# Patient Record
Sex: Female | Born: 1990
Health system: Southern US, Community
[De-identification: ages and names within clinical notes are randomized; demographics above are authoritative.]

## PROBLEM LIST (undated history)

## (undated) ENCOUNTER — Inpatient Hospital Stay (HOSPITAL_COMMUNITY): Payer: Medicaid Other

## (undated) DIAGNOSIS — R011 Cardiac murmur, unspecified: Secondary | ICD-10-CM

## (undated) DIAGNOSIS — D219 Benign neoplasm of connective and other soft tissue, unspecified: Secondary | ICD-10-CM

## (undated) DIAGNOSIS — H4710 Unspecified papilledema: Secondary | ICD-10-CM

## (undated) DIAGNOSIS — R42 Dizziness and giddiness: Secondary | ICD-10-CM

## (undated) DIAGNOSIS — D649 Anemia, unspecified: Secondary | ICD-10-CM

## (undated) DIAGNOSIS — F419 Anxiety disorder, unspecified: Secondary | ICD-10-CM

## (undated) DIAGNOSIS — Z8679 Personal history of other diseases of the circulatory system: Secondary | ICD-10-CM

## (undated) HISTORY — DX: Anemia, unspecified: D64.9

## (undated) HISTORY — DX: Benign neoplasm of connective and other soft tissue, unspecified: D21.9

## (undated) HISTORY — DX: Dizziness and giddiness: R42

## (undated) HISTORY — PX: APPENDECTOMY: SHX54

## (undated) HISTORY — DX: Unspecified papilledema: H47.10

---
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
## 2017-04-13 HISTORY — PX: APPENDECTOMY: SHX54

## 2018-05-18 DIAGNOSIS — D251 Intramural leiomyoma of uterus: Secondary | ICD-10-CM | POA: Insufficient documentation

## 2018-05-18 DIAGNOSIS — Z8614 Personal history of Methicillin resistant Staphylococcus aureus infection: Secondary | ICD-10-CM | POA: Insufficient documentation

## 2018-05-18 DIAGNOSIS — J45909 Unspecified asthma, uncomplicated: Secondary | ICD-10-CM | POA: Insufficient documentation

## 2018-06-04 DIAGNOSIS — D219 Benign neoplasm of connective and other soft tissue, unspecified: Secondary | ICD-10-CM | POA: Insufficient documentation

## 2018-06-15 DIAGNOSIS — D259 Leiomyoma of uterus, unspecified: Secondary | ICD-10-CM | POA: Diagnosis not present

## 2019-11-09 ENCOUNTER — Encounter: Payer: Self-pay | Admitting: Nurse Practitioner

## 2019-11-09 ENCOUNTER — Ambulatory Visit (INDEPENDENT_AMBULATORY_CARE_PROVIDER_SITE_OTHER): Payer: BC Managed Care – PPO | Admitting: Nurse Practitioner

## 2019-11-09 ENCOUNTER — Other Ambulatory Visit: Payer: Self-pay

## 2019-11-09 VITALS — BP 120/72 | HR 88 | Temp 97.8°F | Ht 65.2 in | Wt 205.2 lb

## 2019-11-09 DIAGNOSIS — R002 Palpitations: Secondary | ICD-10-CM | POA: Diagnosis not present

## 2019-11-09 DIAGNOSIS — E559 Vitamin D deficiency, unspecified: Secondary | ICD-10-CM

## 2019-11-09 DIAGNOSIS — R42 Dizziness and giddiness: Secondary | ICD-10-CM | POA: Diagnosis not present

## 2019-11-09 DIAGNOSIS — Z1159 Encounter for screening for other viral diseases: Secondary | ICD-10-CM | POA: Diagnosis not present

## 2019-11-09 DIAGNOSIS — Z114 Encounter for screening for human immunodeficiency virus [HIV]: Secondary | ICD-10-CM

## 2019-11-09 DIAGNOSIS — Z7689 Persons encountering health services in other specified circumstances: Secondary | ICD-10-CM | POA: Diagnosis not present

## 2019-11-09 NOTE — Progress Notes (Signed)
This visit occurred during the SARS-CoV-2 public health emergency.  Safety protocols were in place, including screening questions prior to the visit, additional usage of staff PPE, and extensive cleaning of exam room while observing appropriate contact time as indicated for disinfecting solutions.  Subjective:     Patient ID: Carolyn Cole , female    DOB: [birth year] , 29 y.o.   MRN: [id number]   Chief Complaint  Patient presents with  . Establish Care  . Dizziness    HPI  Here to establish care, she went on google and follow up.  She had been seeing Dr. Kyung Rudd at [facility]. She is originally from [place], has been in [place] for 2 years.  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  Single.  No children.    PMH -appendectomy, fibroids, had a heart murmur as a child.   The last time she was at Dr. Jeanie Sewer she was given iron and vitamin d was low and medications from vertigo. She is no longer taking vitamin d.    She reports she was feeling faint and dizzy yesterday.  She drinks about 2 glasses of water a day. LMP 7/20 - regular, no current birth control. She also reports having heart palpitations after getting in bed while in [place].  Does not drink caffeine but does eat chocolate. Her symptoms occur mostly at work. She does report having a concussion while in undergraduate school.  She will have problems when scrolling through phone and tv channels.  She does feel like the room is spinning.  The previous provider was going to send her to an ENT but did not have any insurance. She did take the medication for dizziness 2 times but was not effective.   [facility] - mother - thyroid problems, hypertension. Father - hypertension, bleeding ulcers. Sister (middle sister) Chiari malformation, Older sister - breast lumps.   She does report she has had a mammogram to evaluate lumps as a child.      Past Medical  History:  Diagnosis Date  . Anemia   . Fibroids   . Vertigo      Family History  Problem Relation Age of Onset  . Thyroid disease Mother   . Hypertension Mother   . Hypertension Father   . Ulcers Father   . Gout Paternal Grandmother      Current Outpatient Medications:  Marland Kitchen  MECLIZINE HCL PO, Take by mouth as needed., Disp: , Rfl:  .  Vitamin D, Ergocalciferol, (DRISDOL) 1.25 MG (50000 UNIT) CAPS capsule, Take 1 capsule (50,000 Units total) by mouth every 7 (seven) days., Disp: 12 capsule, Rfl: 0   No Known Allergies   Review of Systems  Constitutional: Negative.   Respiratory: Negative.   Cardiovascular: Negative.   Gastrointestinal: Negative.   Neurological: Positive for dizziness.  Psychiatric/Behavioral: Negative.      Today's Vitals   11/09/19 1610 11/09/19 1616 11/09/19 1618 11/09/19 1620  BP: 118/82 120/82 118/70 120/72  Pulse: 96 65 71 88  Temp: 97.8 F (36.6 C)     TempSrc: Oral     Weight: (!) 205 lb 3.2 oz (93.1 kg)     Height: 5' 5.2" (1.656 m)     PainSc: 0-No pain      Body mass index is 33.94 kg/m.   Objective:  Physical Exam Constitutional:      Appearance: Normal appearance. She is obese.  Eyes:  Extraocular Movements: Extraocular movements intact.     Right eye: No nystagmus.     Conjunctiva/sclera: Conjunctivae normal.     Pupils: Pupils are equal, round, and reactive to light.  Cardiovascular:     Rate and Rhythm: Normal rate and regular rhythm.     Pulses: Normal pulses.     Heart sounds: Normal heart sounds.  Pulmonary:     Effort: Pulmonary effort is normal. No respiratory distress.     Breath sounds: Normal breath sounds. No wheezing.  Skin:    General: Skin is warm and dry.  Neurological:     General: No focal deficit present.     Mental Status: She is alert and oriented to person, place, and time.     Cranial Nerves: Cranial nerves are intact. No cranial nerve deficit.  Psychiatric:        Mood and Affect: Mood normal.         Behavior: Behavior normal.        Thought Content: Thought content normal.        Judgment: Judgment normal.         Assessment And Plan:     1. Establishing care with new doctor, encounter for  This is her first visit here had been seeing Dr. Jeanie Sewer in [place] as recent as April 2021  2. Palpitations  EKG done, NSR  No abnormal findings on physical exam.    - CBC with Differential/Platelet - TSH  3. Vertigo  She is having worsening dizziness and does not recall being diagnosed with vertigo  Will check for metabolic causes  She is encouraged to take meclizine as needed - CBC with Differential/Platelet - TSH  4. Vitamin D deficiency - VITAMIN D 25 Hydroxy (Vit-D Deficiency, Fractures)  6. Encounter for hepatitis C screening test for low risk patient  Will check Hepatitis C screening due to recent recommendations to screen all adults 18 years and older - Hepatitis C antibody  6. Encounter for HIV (human immunodeficiency virus) test  - HIV Antibody (routine testing w rflx)   Patient was given opportunity to ask questions. Patient verbalized understanding of the plan and was able to repeat key elements of the plan. All questions were answered to their satisfaction.  Minette Brine, FNP   I, Minette Brine, FNP, have reviewed all documentation for this visit. The documentation on 12/05/19 for the exam, diagnosis, procedures, and orders are all accurate and complete.  THE PATIENT IS ENCOURAGED TO PRACTICE SOCIAL DISTANCING DUE TO THE COVID-19 PANDEMIC.

## 2019-11-09 NOTE — Patient Instructions (Signed)
COVID-19 Vaccine Information can be found at: [email or web address] For questions related to vaccine distribution or appointments, please email [email or web address] or call [phone].    

## 2019-11-10 LAB — CBC WITH DIFFERENTIAL/PLATELET
Basophils Absolute: 0 10*3/uL (ref 0.0–0.2)
Basos: 0 %
EOS (ABSOLUTE): 0.2 10*3/uL (ref 0.0–0.4)
Eos: 4 %
Hematocrit: 41.1 % (ref 34.0–46.6)
Hemoglobin: 13.6 g/dL (ref 11.1–15.9)
Immature Grans (Abs): 0 10*3/uL (ref 0.0–0.1)
Immature Granulocytes: 0 %
Lymphocytes Absolute: 1.4 10*3/uL (ref 0.7–3.1)
Lymphs: 27 %
MCH: 32.5 pg (ref 26.6–33.0)
MCHC: 33.1 g/dL (ref 31.5–35.7)
MCV: 98 fL — ABNORMAL HIGH (ref 79–97)
Monocytes Absolute: 0.5 10*3/uL (ref 0.1–0.9)
Monocytes: 10 %
Neutrophils Absolute: 3 10*3/uL (ref 1.4–7.0)
Neutrophils: 59 %
Platelets: 281 10*3/uL (ref 150–450)
RBC: 4.19 x10E6/uL (ref 3.77–5.28)
RDW: 13.1 % (ref 11.7–15.4)
WBC: 5.1 10*3/uL (ref 3.4–10.8)

## 2019-11-10 LAB — HIV ANTIBODY (ROUTINE TESTING W REFLEX): HIV Screen 4th Generation wRfx: NONREACTIVE

## 2019-11-10 LAB — TSH: TSH: 1.94 u[IU]/mL (ref 0.450–4.500)

## 2019-11-10 LAB — VITAMIN D 25 HYDROXY (VIT D DEFICIENCY, FRACTURES): Vit D, 25-Hydroxy: 27.4 ng/mL — ABNORMAL LOW (ref 30.0–100.0)

## 2019-11-10 LAB — HEPATITIS C ANTIBODY: Hep C Virus Ab: 0.1 s/co ratio (ref 0.0–0.9)

## 2019-11-13 ENCOUNTER — Other Ambulatory Visit: Payer: Self-pay | Admitting: Nurse Practitioner

## 2019-11-13 DIAGNOSIS — E559 Vitamin D deficiency, unspecified: Secondary | ICD-10-CM

## 2019-11-13 MED ORDER — VITAMIN D (ERGOCALCIFEROL) 1.25 MG (50000 UNIT) PO CAPS: 50000.0000 [IU] | ORAL_CAPSULE | ORAL | 0 refills | Status: DC

## 2019-11-15 ENCOUNTER — Other Ambulatory Visit: Payer: Self-pay

## 2019-11-15 ENCOUNTER — Telehealth: Payer: Self-pay

## 2019-11-15 MED ORDER — VITAMIN D (ERGOCALCIFEROL) 1.25 MG (50000 UNIT) PO CAPS: 50000.0000 [IU] | ORAL_CAPSULE | ORAL | 0 refills | Status: DC

## 2019-11-15 NOTE — Telephone Encounter (Signed)
PT RTND CALL FOR TEST RESULTS.Marland KitchenPLEASE CALL

## 2019-11-15 NOTE — Telephone Encounter (Signed)
Patient notified YL,RMA 

## 2019-11-22 DIAGNOSIS — N920 Excessive and frequent menstruation with regular cycle: Secondary | ICD-10-CM | POA: Diagnosis not present

## 2019-12-05 ENCOUNTER — Ambulatory Visit: Payer: BC Managed Care – PPO | Admitting: Nurse Practitioner

## 2019-12-05 ENCOUNTER — Encounter: Payer: Self-pay | Admitting: Nurse Practitioner

## 2019-12-05 ENCOUNTER — Other Ambulatory Visit: Payer: Self-pay

## 2019-12-05 VITALS — Temp 98.1°F | Ht 65.0 in | Wt 206.8 lb

## 2019-12-05 DIAGNOSIS — R519 Headache, unspecified: Secondary | ICD-10-CM

## 2019-12-05 DIAGNOSIS — R42 Dizziness and giddiness: Secondary | ICD-10-CM

## 2019-12-05 DIAGNOSIS — D219 Benign neoplasm of connective and other soft tissue, unspecified: Secondary | ICD-10-CM | POA: Diagnosis not present

## 2019-12-05 DIAGNOSIS — N920 Excessive and frequent menstruation with regular cycle: Secondary | ICD-10-CM | POA: Diagnosis not present

## 2019-12-05 NOTE — Progress Notes (Signed)
I,Yamilka Roman [employer] as a Education administrator for [employer], FNP.,have documented all relevant documentation on the behalf of Minette Brine, FNP,as directed by  Minette Brine, FNP while in the presence of Minette Brine, [employer]. This visit occurred during the SARS-CoV-2 public health emergency.  Safety protocols were in place, including screening questions prior to the visit, additional usage of staff PPE, and extensive cleaning of exam room while observing appropriate contact time as indicated for disinfecting solutions.  Subjective:     Patient ID: Carolyn Cole , female    DOB: November 23, 1990 , 29 y.o.   MRN: [id number]   Chief Complaint  Patient presents with  . Dizziness    patient stated she has been feeling dizzy and out of it. she wants to know if she can get a scan on her brain or something. she stated she was on meclinzine but that doesnt work for her     HPI  Continues to have dizziness.  She has been taking meclizine without dizziness. 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   She is requesting a CT scan.  Drinking a little under a 1/2 gallon of water a day. LMP 8/15 - normal.  She does have a history of a concussion in 2014. She does have a headache above her left eye.  She has been doing a tea.    She reports she feels "loopy", she was having a headache but does not want to take medications.  She will also have trouble with looking at the TV screen or phone.     Past Medical History:  Diagnosis Date  . Anemia   . Fibroids   . Vertigo      Family History  Problem Relation Age of Onset  . Thyroid disease Mother   . Hypertension Mother   . Hypertension Father   . Ulcers Father   . Gout Paternal Grandmother      Current Outpatient Medications:  Marland Kitchen  MECLIZINE HCL PO, Take by mouth as needed., Disp: , Rfl:  .  Vitamin D, Ergocalciferol, (DRISDOL) 1.25 MG (50000 UNIT) CAPS capsule, Take 1 capsule (50,000 Units  total) by mouth every 7 (seven) days., Disp: 12 capsule, Rfl: 0   No Known Allergies   Review of Systems  Constitutional: Negative.   Respiratory: Negative.  Negative for cough.   Cardiovascular: Negative.  Negative for chest pain, palpitations and leg swelling.  Neurological: Positive for dizziness. Negative for headaches.  Psychiatric/Behavioral: Negative.      Today's Vitals   12/05/19 1143  Temp: 98.1 F (36.7 C)  TempSrc: Oral  Weight: 206 lb 12.8 oz (93.8 kg)  Height: 5\' 5"  (1.651 m)  PainSc: 0-No pain   Body mass index is 34.41 kg/m.   Objective:  Physical Exam Constitutional:      General: She is not in acute distress.    Appearance: Normal appearance.  Cardiovascular:     Rate and Rhythm: Normal rate and regular rhythm.     Pulses: Normal pulses.     Heart sounds: Normal heart sounds. No murmur heard.   Pulmonary:     Effort: Pulmonary effort is normal. No respiratory distress.     Breath sounds: Normal breath sounds.  Neurological:     General: No focal deficit present.     Mental Status: She is alert and oriented to person, place, and time.  Psychiatric:  Mood and Affect: Mood normal.        Behavior: Behavior normal.        Thought Content: Thought content normal.        Judgment: Judgment normal.         Assessment And Plan:     1. Dizziness  Continues to have dizziness even when taking meclizine  Will send for CT scan brain without contrast  Take magnesium as needed  - CT Head Wo Contrast; Future  2. Acute nonintractable headache, unspecified headache type  I discussed with her that her dizziness could be related to an atypical migraine, she is encouraged to take tylenol 500 mg as needed for her headache and see if she has improvement  She can also take magnesium 250 mg daily with evening meal.    Patient was given opportunity to ask questions. Patient verbalized understanding of the plan and was able to repeat key elements of the  plan. All questions were answered to their satisfaction.  Minette Brine, FNP   I, Minette Brine, FNP, have reviewed all documentation for this visit. The documentation on 12/05/19 for the exam, diagnosis, procedures, and orders are all accurate and complete.  THE PATIENT IS ENCOURAGED TO PRACTICE SOCIAL DISTANCING DUE TO THE COVID-19 PANDEMIC.

## 2019-12-05 NOTE — Patient Instructions (Signed)
   Magnesium 250 mg with evening meal to help with headache  Be sure to stay well hydrated with water.   Pending scan results will refer to Neurology if needed.  Encouraged to take tylenol 500 mg as needed for headache

## 2019-12-06 ENCOUNTER — Ambulatory Visit: Payer: BC Managed Care – PPO | Admitting: Nurse Practitioner

## 2019-12-08 ENCOUNTER — Other Ambulatory Visit: Payer: BC Managed Care – PPO

## 2019-12-08 ENCOUNTER — Other Ambulatory Visit: Payer: Self-pay

## 2019-12-08 DIAGNOSIS — Z20822 Contact with and (suspected) exposure to covid-19: Secondary | ICD-10-CM

## 2019-12-10 LAB — SARS-COV-2, NAA 2 DAY TAT

## 2019-12-10 LAB — NOVEL CORONAVIRUS, NAA: SARS-CoV-2, NAA: DETECTED — AB

## 2019-12-13 ENCOUNTER — Other Ambulatory Visit: Payer: BC Managed Care – PPO

## 2019-12-15 ENCOUNTER — Other Ambulatory Visit: Payer: Self-pay | Admitting: Nurse Practitioner

## 2019-12-19 ENCOUNTER — Other Ambulatory Visit: Payer: Self-pay

## 2019-12-22 ENCOUNTER — Other Ambulatory Visit: Payer: Self-pay

## 2019-12-22 ENCOUNTER — Ambulatory Visit
Admission: RE | Admit: 2019-12-22 | Discharge: 2019-12-22 | Disposition: A | Discharge status: Home / Self Care | Payer: BC Managed Care – PPO | Source: Ambulatory Visit | Attending: Nurse Practitioner | Admitting: Nurse Practitioner

## 2019-12-22 DIAGNOSIS — R42 Dizziness and giddiness: Secondary | ICD-10-CM

## 2020-01-22 DIAGNOSIS — R519 Headache, unspecified: Secondary | ICD-10-CM | POA: Diagnosis not present

## 2020-01-22 DIAGNOSIS — H47333 Pseudopapilledema of optic disc, bilateral: Secondary | ICD-10-CM | POA: Diagnosis not present

## 2020-01-29 DIAGNOSIS — E669 Obesity, unspecified: Secondary | ICD-10-CM | POA: Diagnosis not present

## 2020-01-29 DIAGNOSIS — D219 Benign neoplasm of connective and other soft tissue, unspecified: Secondary | ICD-10-CM | POA: Diagnosis not present

## 2020-01-29 DIAGNOSIS — Z01411 Encounter for gynecological examination (general) (routine) with abnormal findings: Secondary | ICD-10-CM | POA: Diagnosis not present

## 2020-01-29 DIAGNOSIS — Z13220 Encounter for screening for lipoid disorders: Secondary | ICD-10-CM | POA: Diagnosis not present

## 2020-01-29 DIAGNOSIS — Z124 Encounter for screening for malignant neoplasm of cervix: Secondary | ICD-10-CM | POA: Diagnosis not present

## 2020-01-29 DIAGNOSIS — Z62819 Personal history of unspecified abuse in childhood: Secondary | ICD-10-CM | POA: Diagnosis not present

## 2020-04-08 ENCOUNTER — Encounter: Payer: Self-pay | Admitting: Diagnostic Neuroimaging

## 2020-04-08 ENCOUNTER — Ambulatory Visit: Payer: BC Managed Care – PPO | Admitting: Diagnostic Neuroimaging

## 2020-04-08 VITALS — BP 119/77 | HR 73 | Ht 64.0 in | Wt 215.0 lb

## 2020-04-08 DIAGNOSIS — H4710 Unspecified papilledema: Secondary | ICD-10-CM | POA: Diagnosis not present

## 2020-04-08 DIAGNOSIS — G4489 Other headache syndrome: Secondary | ICD-10-CM

## 2020-04-08 NOTE — Patient Instructions (Signed)
  HEADACHES / LIGHTHEADEDNESS - check MRI brain; then consider LP

## 2020-04-08 NOTE — Progress Notes (Signed)
[facility]  PATIENT: Carolyn Cole DOB: 1991-01-17  REFERRING CLINICIAN: Stephannie Li, OD HISTORY FROM: patient  REASON FOR VISIT: new consult    HISTORICAL  CHIEF COMPLAINT:  Chief Complaint  Patient presents with  . Bilateral Papilledema    Rm 7 New Pt     HISTORY OF PRESENT ILLNESS:   29 year old female here for evaluation of papilledema.  For past 1 year patient has had intermittent headaches on the front or side of her head.  Sometimes she has ringing in the ears.  No blurred vision, nausea, sensitive to light or sound.  She was also having some intermittent "dizziness" sensations which she describes as lightheadedness and faint sensation when she stands up or moves too quickly.  No spinning sensation or nausea.  She was also having some problems with visual attention and focusing, especially when shopping at the store or using her phone.  This is not a blurred vision problem but but instead a difficulty processing the information.  She also had some eye twitching and muscle twitching on the left side.    Patient has had weight fluctuation over the past 2 to 3 years ranging from 170 up to 215 pounds.  She is currently at her highest weight.  Patient went to optometrist in October 2021 for evaluation and was found to have bilateral papilledema on dilated retinal exam and OCT testing.  Overall symptoms have improved since Oct 2021.   REVIEW OF SYSTEMS: Full 14 system review of systems performed and negative with exception of: As per HPI.  ALLERGIES: No Known Allergies  HOME MEDICATIONS: Outpatient Medications Prior to Visit  Medication Sig Dispense Refill  . Vitamin D, Ergocalciferol, (DRISDOL) 1.25 MG (50000 UNIT) CAPS capsule TAKE 1 CAPSULE BY MOUTH EVERY 7 DAYS 12 capsule 0  . MECLIZINE HCL PO Take by mouth as needed. (Patient not taking: Reported on 04/08/2020)     No facility-administered medications prior to visit.    PAST MEDICAL  HISTORY: Past Medical History:  Diagnosis Date  . Anemia   . Fibroids   . Papilledema, both eyes   . Vertigo     PAST SURGICAL HISTORY: Past Surgical History:  Procedure Laterality Date  . APPENDECTOMY      FAMILY HISTORY: Family History  Problem Relation Age of Onset  . Thyroid disease Mother   . Hypertension Mother   . Hypertension Father   . Ulcers Father   . Gout Paternal Grandmother   . Chiari malformation Sister     SOCIAL HISTORY: Social History   Socioeconomic History  . Marital status: Single    Spouse name: Not on file  . Number of children: 0  . Years of education: Not on file  . Highest education level: Master's degree (e.g., MA, MS, MEng, MEd, MSW, MBA)  Occupational History  . Not on file  Tobacco Use  . Smoking status: Never Smoker  . Smokeless tobacco: Never Used  Substance and Sexual Activity  . Alcohol use: Yes    Comment: social  . Drug use: Never  . Sexual activity: Not on file  Other Topics Concern  . Not on file  Social History Narrative   Caffeine- green tea   Social Determinants of Health   Financial Resource Strain: Not on file  Food Insecurity: Not on file  Transportation Needs: Not on file  Physical Activity: Not on file  Stress: Not on file  Social Connections: Not on file  Intimate Partner Violence: Not on file  PHYSICAL EXAM  GENERAL EXAM/CONSTITUTIONAL: Vitals:  Vitals:   04/08/20 0945  BP: 119/77  Pulse: 73  Weight: 215 lb (97.5 kg)  Height: 5\' 4"  (1.626 m)     Body mass index is 36.9 kg/m. Wt Readings from Last 3 Encounters:  04/08/20 215 lb (97.5 kg)  12/05/19 206 lb 12.8 oz (93.8 kg)  11/09/19 (!) 205 lb 3.2 oz (93.1 kg)     Patient is in no distress; well developed, nourished and groomed; neck is supple  CARDIOVASCULAR:  Examination of carotid arteries is normal; no carotid bruits  Regular rate and rhythm, no murmurs  Examination of peripheral vascular system by observation and palpation  is normal  EYES:  Ophthalmoscopic exam of optic discs and posterior segments is normal; no papilledema or hemorrhages  No exam data present  MUSCULOSKELETAL:  Gait, strength, tone, movements noted in Neurologic exam below  NEUROLOGIC: MENTAL STATUS:  No flowsheet data found.  awake, alert, oriented to person, place and time  recent and remote memory intact  normal attention and concentration  language fluent, comprehension intact, naming intact  fund of knowledge appropriate  CRANIAL NERVE:   2nd - BLURRED OPTIC DISC MARGINS  2nd, 3rd, 4th, 6th - pupils equal and reactive to light, visual fields full to confrontation, extraocular muscles intact, no nystagmus  5th - facial sensation symmetric  7th - facial strength symmetric  8th - hearing intact  9th - palate elevates symmetrically, uvula midline  11th - shoulder shrug symmetric  12th - tongue protrusion midline  MOTOR:   normal bulk and tone, full strength in the BUE, BLE  SENSORY:   normal and symmetric to light touch, temperature, vibration  COORDINATION:   finger-nose-finger, fine finger movements normal  REFLEXES:   deep tendon reflexes present and symmetric  GAIT/STATION:   narrow based gait     DIAGNOSTIC DATA (LABS, IMAGING, TESTING) - I reviewed patient records, labs, notes, testing and imaging myself where available.  Lab Results  Component Value Date   WBC 5.1 11/09/2019   HGB 13.6 11/09/2019   HCT 41.1 11/09/2019   MCV 98 (H) 11/09/2019   PLT 281 11/09/2019   No results found for: NA, K, CL, CO2, GLUCOSE, BUN, CREATININE, CALCIUM, PROT, ALBUMIN, AST, ALT, ALKPHOS, BILITOT, GFRNONAA, GFRAA No results found for: CHOL, HDL, LDLCALC, LDLDIRECT, TRIG, CHOLHDL No results found for: 11/11/2019 No results found for: VITAMINB12 Lab Results  Component Value Date   TSH 1.940 11/09/2019     12/22/19 CT head [I reviewed images myself and agree with interpretation. -VRP]  - no acute  findings    ASSESSMENT AND PLAN  29 y.o. year old female here with 1 year of intermittent lightheadedness, dizziness, difficulty with visual attention, intermittent ringing in ears, weight gain, and found to have papilledema.  May represent idiopathic intracranial hypertension.  Dx:  1. Papilledema   2. Other headache syndrome     PLAN:  PAPILLEDEMA / DIFFICULTY FOCUSING / TWITCHING - check MRI brain; then consider LP  Orders Placed This Encounter  Procedures  . MR BRAIN W WO CONTRAST   Return PENDING TEST RESULTS, for pending if symptoms worsen or fail to improve.    27, MD 04/08/2020, 10:31 AM Certified in Neurology, Neurophysiology and Neuroimaging  [facility] [address] [place], [place] [phone]

## 2020-04-10 ENCOUNTER — Other Ambulatory Visit: Payer: Self-pay | Admitting: Nurse Practitioner

## 2020-04-13 NOTE — L&D Delivery Note (Addendum)
Delivery Note Arrived to room to evaluate for cervical change. Patient was found to be complete and 0 to +1 station. Fetal head was delivered. A nuchal x 1 was present and reduced. Shoulders and body followed without difficulty. Infant was placed on mother's chest, mouth and nose were bulb suctioned and let out a vigorous cry. Delayed cord clamping was performed for 60 seconds. Venous cord blood was collected. Placenta delivered spontaneously. Cervix, vagina, perineum and labia were inspected and a second degree perineal laceration was noted and repaired using 3-0 Vicryl. Bilateral labia majora superficial, hemostatic lacerations were noted. Uterus fundus firm. Due to lower uterine segment atony, bimanual massage performed and clots extracted. TXA 1g IV and Cytotec 1024mcg per rectum was administered. Placenta was inspected, found to be intact with a 3 vessel cord. Counts were correct.   At 1:29 PM a viable and healthy female was delivered via Vaginal, Spontaneous (Presentation: Left Occiput Anterior).  APGAR: 9, 9; weight: see infant's chart   Placenta status: Spontaneous;Expressed;Pathology, Intact.  Cord: 3 vessels with the following complications: None.  Cord pH: Not collected  Anesthesia: Epidural Episiotomy: None Lacerations: 2nd degree Suture Repair: 3.0 vicryl Est. Blood Loss (mL): 817  Mom to postpartum.  Baby to Couplet care / Skin to Skin.  Carolyn Cole 01/29/2021, 1:52 PM

## 2020-04-17 ENCOUNTER — Telehealth: Payer: Self-pay | Admitting: Diagnostic Neuroimaging

## 2020-04-17 NOTE — Telephone Encounter (Signed)
LVM for pt to call back to schedule  [employer] auth through [facility]: [id number] (exp. 04/16/20 to 05/16/20)

## 2020-06-04 ENCOUNTER — Ambulatory Visit (INDEPENDENT_AMBULATORY_CARE_PROVIDER_SITE_OTHER): Payer: BC Managed Care – PPO | Admitting: *Deleted

## 2020-06-04 ENCOUNTER — Other Ambulatory Visit: Payer: Self-pay

## 2020-06-04 VITALS — Ht 64.0 in

## 2020-06-04 DIAGNOSIS — Z3201 Encounter for pregnancy test, result positive: Secondary | ICD-10-CM | POA: Diagnosis not present

## 2020-06-04 LAB — POCT URINE PREGNANCY: Preg Test, Ur: POSITIVE — AB

## 2020-06-04 NOTE — Progress Notes (Signed)
   Carolyn Cole presents today for UPT. She has no unusual complaints. LMP: 04/21/2020    OBJECTIVE: Appears well, in no apparent distress.  OB History    Gravida  1   Para      Term      Preterm      AB      Living        SAB      IAB      Ectopic      Multiple      Live Births             Home UPT Result: Positive In-Office UPT result: Positive I have reviewed the patient's medical, obstetrical, social, and family histories, and medications.   ASSESSMENT: Positive pregnancy test  PLAN Prenatal care to be completed at: undecided  Derl Barrow, RN

## 2020-06-11 DIAGNOSIS — O26899 Other specified pregnancy related conditions, unspecified trimester: Secondary | ICD-10-CM | POA: Diagnosis not present

## 2020-06-11 DIAGNOSIS — Z3201 Encounter for pregnancy test, result positive: Secondary | ICD-10-CM | POA: Diagnosis not present

## 2020-06-11 DIAGNOSIS — Z3400 Encounter for supervision of normal first pregnancy, unspecified trimester: Secondary | ICD-10-CM | POA: Diagnosis not present

## 2020-06-11 DIAGNOSIS — Z3401 Encounter for supervision of normal first pregnancy, first trimester: Secondary | ICD-10-CM | POA: Diagnosis not present

## 2020-06-11 LAB — OB RESULTS CONSOLE HIV ANTIBODY (ROUTINE TESTING): HIV: NONREACTIVE

## 2020-06-11 LAB — OB RESULTS CONSOLE RUBELLA ANTIBODY, IGM: Rubella: IMMUNE

## 2020-06-11 LAB — OB RESULTS CONSOLE HEPATITIS B SURFACE ANTIGEN: Hepatitis B Surface Ag: NEGATIVE

## 2020-06-11 LAB — OB RESULTS CONSOLE ANTIBODY SCREEN: Antibody Screen: NEGATIVE

## 2020-06-11 LAB — OB RESULTS CONSOLE ABO/RH: RH Type: POSITIVE

## 2020-07-03 DIAGNOSIS — Z3401 Encounter for supervision of normal first pregnancy, first trimester: Secondary | ICD-10-CM | POA: Diagnosis not present

## 2020-07-10 DIAGNOSIS — F4323 Adjustment disorder with mixed anxiety and depressed mood: Secondary | ICD-10-CM | POA: Diagnosis not present

## 2020-07-24 DIAGNOSIS — F4323 Adjustment disorder with mixed anxiety and depressed mood: Secondary | ICD-10-CM | POA: Diagnosis not present

## 2020-08-11 DIAGNOSIS — F4323 Adjustment disorder with mixed anxiety and depressed mood: Secondary | ICD-10-CM | POA: Diagnosis not present

## 2020-08-13 ENCOUNTER — Other Ambulatory Visit: Payer: Self-pay | Admitting: Nurse Practitioner

## 2020-08-16 DIAGNOSIS — Z20822 Contact with and (suspected) exposure to covid-19: Secondary | ICD-10-CM | POA: Diagnosis not present

## 2020-08-21 DIAGNOSIS — Z3482 Encounter for supervision of other normal pregnancy, second trimester: Secondary | ICD-10-CM | POA: Diagnosis not present

## 2020-08-21 DIAGNOSIS — Z3402 Encounter for supervision of normal first pregnancy, second trimester: Secondary | ICD-10-CM | POA: Diagnosis not present

## 2020-08-25 DIAGNOSIS — F4323 Adjustment disorder with mixed anxiety and depressed mood: Secondary | ICD-10-CM | POA: Diagnosis not present

## 2020-09-03 ENCOUNTER — Encounter (HOSPITAL_COMMUNITY): Payer: Self-pay

## 2020-09-03 ENCOUNTER — Other Ambulatory Visit: Payer: Self-pay

## 2020-09-03 ENCOUNTER — Emergency Department (HOSPITAL_COMMUNITY)
Admission: EM | Admit: 2020-09-03 | Discharge: 2020-09-03 | Disposition: A | Discharge status: Home / Self Care | Payer: BC Managed Care – PPO | Attending: Emergency Medicine | Admitting: Emergency Medicine

## 2020-09-03 DIAGNOSIS — R1031 Right lower quadrant pain: Secondary | ICD-10-CM | POA: Insufficient documentation

## 2020-09-03 DIAGNOSIS — Z3A19 19 weeks gestation of pregnancy: Secondary | ICD-10-CM | POA: Insufficient documentation

## 2020-09-03 DIAGNOSIS — O26892 Other specified pregnancy related conditions, second trimester: Secondary | ICD-10-CM | POA: Insufficient documentation

## 2020-09-03 DIAGNOSIS — D219 Benign neoplasm of connective and other soft tissue, unspecified: Secondary | ICD-10-CM

## 2020-09-03 DIAGNOSIS — O3412 Maternal care for benign tumor of corpus uteri, second trimester: Secondary | ICD-10-CM | POA: Diagnosis not present

## 2020-09-03 MED ORDER — IBUPROFEN 800 MG PO TABS
800.0000 mg | ORAL_TABLET | Freq: Once | ORAL | Status: AC
  Administered 2020-09-03: 800 mg via ORAL
  Filled 2020-09-03: qty 1

## 2020-09-03 NOTE — ED Triage Notes (Signed)
Patient reports that she is [redacted] weeks pregnant. Patient c/o right abdominal pain since yesterday. Patient states she was driving from [place] yesterday and also had low back pain, but today the pain continues. Patient reports that she has a uterine fibroid.

## 2020-09-03 NOTE — ED Provider Notes (Signed)
Emergency Medicine Provider Triage Evaluation Note  Carolyn Cole , a 30 y.o. female  was evaluated in triage.  Pt complains of right lower quadrant abdominal pain.  Pain began yesterday but worsened today.  She is currently [redacted] weeks pregnant.  Denies any vaginal bleeding or abnormal discharge.  No fever.  Review of Systems  Positive: Abdominal pain Negative: Fever  Physical Exam  BP (!) 142/82 (BP Location: Right Arm)   Pulse 84   Temp 98.8 F (37.1 C) (Oral)   Resp 16   Ht 5\' 4"  (1.626 m)   Wt 98.4 kg   LMP 04/21/2020 (Within Days)   SpO2 100%   BMI 37.25 kg/m  Gen:   Awake, no distress   Resp:  Normal effort  MSK:   Moves extremities without difficulty  Other:  Right lower quadrant tenderness  Medical Decision Making  Medically screening exam initiated at 4:01 PM.  Appropriate orders placed.  Carolyn Cole was informed that the remainder of the evaluation will be completed by another provider, this initial triage assessment does not replace that evaluation, and the importance of remaining in the ED until their evaluation is complete.  Lab work and will likely need imaging.   Delia Heady, PA-C 09/03/20 1602    Lacretia Leigh, MD 09/03/20 [phone]

## 2020-09-03 NOTE — ED Notes (Signed)
Reviewed d/c instructions with patient and significant other. Pt and visitor deny any further questions. Provided warm packs for transport home. No further care needed at this time

## 2020-09-03 NOTE — Discharge Instructions (Addendum)
Use a moist warm towel as we talked about to the area of your abdomen that hurts.  Go to [facility] immediately if you develop any uterine cramping, vaginal bleeding, or any other problems

## 2020-09-03 NOTE — ED Provider Notes (Addendum)
[place] DEPT Provider Note   CSN: [id number] Arrival date & time: 09/03/20  1529     History Chief Complaint  Patient presents with  . [redacted] weeks pregnant    Carolyn Cole is a 30 y.o. female.  30 year old female currently [redacted] weeks pregnant who presents with 24 hours of right lower quadrant pain.  She has a history of an appendectomy.  Also has a history of uterine fibroids and states that she is tender at the area where she has a known fibroid.  Denies any vaginal bleeding.  Denies any urinary symptoms.  No fever or chills.  Called her OB and used Tylenol without relief.        Past Medical History:  Diagnosis Date  . Anemia   . Fibroids   . Papilledema, both eyes   . Vertigo     There are no problems to display for this patient.   Past Surgical History:  Procedure Laterality Date  . APPENDECTOMY       OB History    Gravida  1   Para      Term      Preterm      AB      Living        SAB      IAB      Ectopic      Multiple      Live Births              Family History  Problem Relation Age of Onset  . Thyroid disease Mother   . Hypertension Mother   . Hypertension Father   . Ulcers Father   . Gout Paternal Grandmother   . Chiari malformation Sister     Social History   Tobacco Use  . Smoking status: Never Smoker  . Smokeless tobacco: Never Used  Vaping Use  . Vaping Use: Never used  Substance Use Topics  . Alcohol use: Not Currently  . Drug use: Never    Home Medications Prior to Admission medications   Medication Sig Start Date End Date Taking? Authorizing Provider  MECLIZINE HCL PO Take by mouth as needed. Patient not taking: Reported on 04/08/2020    [provider]  Vitamin D, Ergocalciferol, (DRISDOL) 1.25 MG (50000 UNIT) CAPS capsule TAKE 1 CAPSULE BY MOUTH EVERY 7 DAYS 08/13/20   Minette Brine, FNP    Allergies    Patient has no known allergies.  Review of Systems   Review  of Systems  All other systems reviewed and are negative.   Physical Exam Updated Vital Signs BP (!) 142/82 (BP Location: Right Arm)   Pulse 84   Temp 98.8 F (37.1 C) (Oral)   Resp 16   Ht 1.626 m (5\' 4" )   Wt 98.4 kg   LMP 04/21/2020 (Within Days)   SpO2 100%   BMI 37.25 kg/m   Physical Exam Vitals and nursing note reviewed.  Constitutional:      General: She is not in acute distress.    Appearance: Normal appearance. She is well-developed. She is not toxic-appearing.  HENT:     Head: Normocephalic and atraumatic.  Eyes:     General: Lids are normal.     Conjunctiva/sclera: Conjunctivae normal.     Pupils: Pupils are equal, round, and reactive to light.  Neck:     Thyroid: No thyroid mass.     Trachea: No tracheal deviation.  Cardiovascular:     Rate and Rhythm:  Normal rate and regular rhythm.     Heart sounds: Normal heart sounds. No murmur heard. No gallop.   Pulmonary:     Effort: Pulmonary effort is normal. No respiratory distress.     Breath sounds: Normal breath sounds. No stridor. No decreased breath sounds, wheezing, rhonchi or rales.  Abdominal:     General: Bowel sounds are normal. There is no distension.     Palpations: Abdomen is soft.     Tenderness: There is abdominal tenderness in the right lower quadrant. There is no rebound.       Comments: Palpable mass noted  Musculoskeletal:        General: No tenderness. Normal range of motion.     Cervical back: Normal range of motion and neck supple.  Skin:    General: Skin is warm and dry.     Findings: No abrasion or rash.  Neurological:     Mental Status: She is alert and oriented to person, place, and time.     GCS: GCS eye subscore is 4. GCS verbal subscore is 5. GCS motor subscore is 6.     Cranial Nerves: No cranial nerve deficit.     Sensory: No sensory deficit.  Psychiatric:        Speech: Speech normal.        Behavior: Behavior normal.     ED Results / Procedures / Treatments    Labs (all labs ordered are listed, but only abnormal results are displayed) Labs Reviewed  BASIC METABOLIC PANEL  CBC WITH DIFFERENTIAL/PLATELET  HCG, QUANTITATIVE, PREGNANCY    EKG None  Radiology No results found.  Procedures Procedures   Medications Ordered in ED Medications - No data to display  ED Course  I have reviewed the triage vital signs and the nursing notes.  Pertinent labs & imaging results that were available during my care of the patient were reviewed by me and considered in my medical decision making (see chart for details).    MDM Rules/Calculators/A&P                          Patient's case discussed with her obstetrician, Dr. Delora Fuel, who recommends patient get 1 dose of ibuprofen here.  She does not recommend patient have an ultrasound at this point.  Will check fetal heart tones.  Plan is for her to follow-up with Dr. Delora Fuel or go to [facility] if she should have any further needs  Fetal heart tones are in the 140s Final Clinical Impression(s) / ED Diagnoses Final diagnoses:  None    Rx / DC Orders ED Discharge Orders    None       Lacretia Leigh, MD 09/03/20 1659    Lacretia Leigh, MD 09/03/20 1709

## 2020-09-04 ENCOUNTER — Telehealth: Payer: Self-pay | Admitting: *Deleted

## 2020-09-04 NOTE — Telephone Encounter (Signed)
Transition Care Management Follow-up Telephone Call  Date of discharge and from where: 09/03/2020 - [facility]  How have you been since you were released from the hospital? "Still in some pain"  Any questions or concerns? No  Items Reviewed:  Did the pt receive and understand the discharge instructions provided? Yes   Medications obtained and verified? Yes   Other? No   Any new allergies since your discharge? No   Dietary orders reviewed? No  Do you have support at home? Yes    Functional Questionnaire: (I = Independent and D = Dependent) ADLs: I  Bathing/Dressing- I  Meal Prep- I  Eating- I  Maintaining continence- I  Transferring/Ambulation- I  Managing Meds- I  Follow up appointments reviewed:   PCP Hospital f/u appt confirmed? No  - has routine appointment in August  Specialist Hospital f/u appt confirmed? No    Are transportation arrangements needed? No   If their condition worsens, is the pt aware to call PCP or go to the Emergency Dept.? Yes  Was the patient provided with contact information for the PCP's office or ED? Yes  Was to pt encouraged to call back with questions or concerns? Yes

## 2020-09-05 DIAGNOSIS — F4323 Adjustment disorder with mixed anxiety and depressed mood: Secondary | ICD-10-CM | POA: Diagnosis not present

## 2020-09-13 DIAGNOSIS — Z36 Encounter for antenatal screening for chromosomal anomalies: Secondary | ICD-10-CM | POA: Diagnosis not present

## 2020-09-13 DIAGNOSIS — Z3402 Encounter for supervision of normal first pregnancy, second trimester: Secondary | ICD-10-CM | POA: Diagnosis not present

## 2020-09-26 DIAGNOSIS — F4323 Adjustment disorder with mixed anxiety and depressed mood: Secondary | ICD-10-CM | POA: Diagnosis not present

## 2020-10-15 DIAGNOSIS — Z362 Encounter for other antenatal screening follow-up: Secondary | ICD-10-CM | POA: Diagnosis not present

## 2020-10-15 DIAGNOSIS — Z3402 Encounter for supervision of normal first pregnancy, second trimester: Secondary | ICD-10-CM | POA: Diagnosis not present

## 2020-10-15 DIAGNOSIS — Z3490 Encounter for supervision of normal pregnancy, unspecified, unspecified trimester: Secondary | ICD-10-CM | POA: Diagnosis not present

## 2020-10-15 LAB — OB RESULTS CONSOLE RPR: RPR: NONREACTIVE

## 2020-10-16 DIAGNOSIS — F4323 Adjustment disorder with mixed anxiety and depressed mood: Secondary | ICD-10-CM | POA: Diagnosis not present

## 2020-10-22 DIAGNOSIS — O368190 Decreased fetal movements, unspecified trimester, not applicable or unspecified: Secondary | ICD-10-CM | POA: Diagnosis not present

## 2020-10-23 DIAGNOSIS — F4323 Adjustment disorder with mixed anxiety and depressed mood: Secondary | ICD-10-CM | POA: Diagnosis not present

## 2020-11-12 ENCOUNTER — Encounter: Payer: Self-pay | Admitting: Nurse Practitioner

## 2020-11-15 DIAGNOSIS — F4323 Adjustment disorder with mixed anxiety and depressed mood: Secondary | ICD-10-CM | POA: Diagnosis not present

## 2020-11-28 DIAGNOSIS — Z23 Encounter for immunization: Secondary | ICD-10-CM | POA: Diagnosis not present

## 2020-11-28 DIAGNOSIS — K30 Functional dyspepsia: Secondary | ICD-10-CM | POA: Diagnosis not present

## 2020-11-28 DIAGNOSIS — Z3403 Encounter for supervision of normal first pregnancy, third trimester: Secondary | ICD-10-CM | POA: Diagnosis not present

## 2020-12-10 DIAGNOSIS — F4323 Adjustment disorder with mixed anxiety and depressed mood: Secondary | ICD-10-CM | POA: Diagnosis not present

## 2020-12-23 DIAGNOSIS — O99213 Obesity complicating pregnancy, third trimester: Secondary | ICD-10-CM | POA: Diagnosis not present

## 2020-12-23 DIAGNOSIS — O3413 Maternal care for benign tumor of corpus uteri, third trimester: Secondary | ICD-10-CM | POA: Diagnosis not present

## 2020-12-23 DIAGNOSIS — Z3490 Encounter for supervision of normal pregnancy, unspecified, unspecified trimester: Secondary | ICD-10-CM | POA: Diagnosis not present

## 2020-12-23 DIAGNOSIS — Z3403 Encounter for supervision of normal first pregnancy, third trimester: Secondary | ICD-10-CM | POA: Diagnosis not present

## 2020-12-23 LAB — OB RESULTS CONSOLE GBS: GBS: NEGATIVE

## 2020-12-23 LAB — OB RESULTS CONSOLE GC/CHLAMYDIA
Chlamydia: NEGATIVE
Gonorrhea: NEGATIVE

## 2020-12-31 DIAGNOSIS — O3413 Maternal care for benign tumor of corpus uteri, third trimester: Secondary | ICD-10-CM | POA: Diagnosis not present

## 2020-12-31 DIAGNOSIS — F4323 Adjustment disorder with mixed anxiety and depressed mood: Secondary | ICD-10-CM | POA: Diagnosis not present

## 2020-12-31 DIAGNOSIS — Z3403 Encounter for supervision of normal first pregnancy, third trimester: Secondary | ICD-10-CM | POA: Diagnosis not present

## 2020-12-31 DIAGNOSIS — O99213 Obesity complicating pregnancy, third trimester: Secondary | ICD-10-CM | POA: Diagnosis not present

## 2020-12-31 DIAGNOSIS — N942 Vaginismus: Secondary | ICD-10-CM | POA: Diagnosis not present

## 2021-01-02 ENCOUNTER — Encounter: Payer: Self-pay | Admitting: Nurse Practitioner

## 2021-01-02 ENCOUNTER — Ambulatory Visit (INDEPENDENT_AMBULATORY_CARE_PROVIDER_SITE_OTHER): Payer: Medicaid Other | Admitting: Nurse Practitioner

## 2021-01-02 ENCOUNTER — Other Ambulatory Visit: Payer: Self-pay

## 2021-01-02 VITALS — BP 112/70 | HR 108 | Temp 99.6°F | Ht 64.8 in | Wt 236.6 lb

## 2021-01-02 DIAGNOSIS — Z6839 Body mass index (BMI) 39.0-39.9, adult: Secondary | ICD-10-CM

## 2021-01-02 DIAGNOSIS — E6609 Other obesity due to excess calories: Secondary | ICD-10-CM

## 2021-01-02 DIAGNOSIS — D229 Melanocytic nevi, unspecified: Secondary | ICD-10-CM | POA: Diagnosis not present

## 2021-01-02 NOTE — Patient Instructions (Signed)
Mole A mole is a colored (pigmented) growth on the skin. Moles are very common. They are usually harmless, but some moles can become cancerous over time. What are the causes? Moles are caused when pigmented skin cells grow together in clusters instead of spreading out in the skin as they normally do. The reason why the skin cells grow together in clusters is not known. What increases the risk? You are more likely to develop a mole if you: Have family members who have moles. Are white. Have blond hair. Are often outdoors and exposed to the sun. Received phototherapy when you were a newborn baby. Are female. What are the signs or symptoms? A mole may be: [place] or black. Flat or raised. Smooth or wrinkled. How is this diagnosed? A mole is diagnosed with a skin exam. If your health care provider thinks a mole may be cancerous, all or part of the mole will be removed for testing (biopsy). How is this treated? Most moles are noncancerous (benign) and do not require treatment. If a mole is found to be cancerous, it will be removed. You may also choose to have a mole removed if it is causing pain or if you do not like the way it looks. Follow these instructions at home: General instructions  Every month, look for new moles and check your existing moles for changes. This is important because a change in a mole can mean that the mole has become cancerous. ABCDE changes in a mole indicate that you should be evaluated by your health care provider. ABCDE stands for: Asymmetry. This means the mole has an irregular shape. It is not round or oval. Border. This means the mole has an irregular or bumpy border. Color. This means the mole has multiple colors in it, including brown, black, blue, red, or tan. Note that it is normal for moles to get darker when a woman is pregnant or takes birth control pills. Diameter. This means the mole is more than 0.2 inches (6 mm) across. Evolving. This refers to any  unusual changes or symptoms in the mole, such as pain, itching, stinging, sensitivity, or bleeding. If you have a large number of moles, see a skin doctor (dermatologist) at least one time every year for a full-body skin check. Lifestyle  When you are outdoors, wear sunscreen with SPF 30 (sun protection factor 30) or higher. Use an adequate amount of sunscreen to cover exposed areas of skin. Put it on 30 minutes before you go out. Reapply it every 2 hours or anytime you come out of the water. When you are out in the sun, wear a broad-brimmed hat and clothing that covers your arms and legs. Wear wraparound sunglasses. Contact a health care provider if: The size, shape, borders, or color of your mole changes. Your mole, or the skin near the mole, becomes painful, sore, red, or swollen. Your mole: Develops more than one color. Itches or bleeds. Becomes scaly, sheds skin, or oozes fluid. Becomes flat or develops raised areas. Becomes hard or soft. You develop a new mole. Summary A mole is a colored (pigmented) growth on the skin. Moles are very common. They are usually harmless, but some moles can become cancerous over time. Every month, look for new moles and check your existing moles for changes. This is important because a change in a mole can mean that the mole has become cancerous. If you have a large number of moles, see a skin doctor (dermatologist) at least one time  every year for a full-body skin check. When you are outdoors, wear sunscreen with SPF 30 (sun protection factor 30) or higher. Reapply it every 2 hours or anytime you come out of the water. Contact a health care provider if you notice changes in a mole or if you develop a new mole. This information is not intended to replace advice given to you by your health care provider. Make sure you discuss any questions you have with your health care provider. Document Revised: 11/03/2018 Document Reviewed: 08/24/2017 Elsevier Patient  Education  [employer].

## 2021-01-02 NOTE — Progress Notes (Signed)
I,Tianna Badgett,acting as a Education administrator for [employer], NP.,have documented all relevant documentation on the behalf of [employer], NP,as directed by  Bary Castilla, NP while in the presence of Bary Castilla, NP.  This visit occurred during the SARS-CoV-2 public health emergency.  Safety protocols were in place, including screening questions prior to the visit, additional usage of staff PPE, and extensive cleaning of exam room while observing appropriate contact time as indicated for disinfecting solutions.  Subjective:     Patient ID: Carolyn Cole , female    DOB: November 24, 1990 , 30 y.o.   MRN: [id number]   Chief Complaint  Patient presents with   Nevus    HPI  Patient is for a mole. The mole is on her upper left thigh. She is pregnant so her skin has been stretching but she has noticed that it has changed in color. She came here to get it looked at. No other concerns.     Past Medical History:  Diagnosis Date   Anemia    Fibroids    Papilledema, both eyes    Vertigo      Family History  Problem Relation Age of Onset   Thyroid disease Mother    Hypertension Mother    Hypertension Father    Ulcers Father    Gout Paternal Grandmother    Chiari malformation Sister      Current Outpatient Medications:    Prenatal Vit-Fe Fumarate-FA (PRENATAL MULTIVITAMIN) TABS tablet, Take 1 tablet by mouth daily at 12 noon., Disp: , Rfl:    No Known Allergies   Review of Systems  Constitutional: Negative.  Negative for chills and fever.  HENT:  Negative for congestion.   Respiratory: Negative.  Negative for cough, shortness of breath and wheezing.   Cardiovascular: Negative.  Negative for chest pain and palpitations.  Gastrointestinal: Negative.   Skin:  Positive for color change.       A mole on left upper thigh  Neurological: Negative.  Negative for weakness and headaches.    Today's Vitals   01/02/21 1638  BP: 112/70  Pulse: (!) 108  Temp: 99.6 F (37.6 C)   TempSrc: Oral  Weight: 236 lb 9.6 oz (107.3 kg)  Height: 5' 4.8" (1.646 m)   Body mass index is 39.62 kg/m.   Objective:  Physical Exam Constitutional:      Appearance: Normal appearance.  HENT:     Head: Normocephalic and atraumatic.  Cardiovascular:     Rate and Rhythm: Normal rate and regular rhythm.     Pulses: Normal pulses.     Heart sounds: Normal heart sounds. No murmur heard. Pulmonary:     Effort: Pulmonary effort is normal. No respiratory distress.     Breath sounds: Normal breath sounds. No wheezing.  Musculoskeletal:     Cervical back: Normal range of motion and neck supple.  Skin:    General: Skin is warm and dry.     Capillary Refill: Capillary refill takes less than 2 seconds.     Comments: Mole with asymmetrical border, raised, evolving in size and color.   Neurological:     Mental Status: She is alert.        Assessment And Plan:     1. Nevus - Ambulatory referral to Dermatology -will send patient to dermatology for further evaluation   2. Class 2 obesity due to excess calories without serious comorbidity with body mass index (BMI) of 39.0 to 39.9 in adult  Advised patient on a healthy  diet including avoiding fast food and red meats. Increase the intake of lean meats including grilled chicken and [place].  Drink a lot of water. Decrease intake of fatty foods. Exercise for 30-45 min. 4-5 a week to decrease the risk of cardiac event.   The patient was encouraged to call or send a message through [place] for any questions or concerns.   Follow up: if symptoms persist or do not get better.   Side effects and appropriate use of all the medication(s) were discussed with the patient today. Patient advised to use the medication(s) as directed by their healthcare provider. The patient was encouraged to read, review, and understand all associated package inserts and contact our office with any questions or concerns. The patient accepts the risks of the treatment  plan and had an opportunity to ask questions.   Patient was given opportunity to ask questions. Patient verbalized understanding of the plan and was able to repeat key elements of the plan. All questions were answered to their satisfaction.  Carolyn Jenness Stemler, DNP   I, Carolyn Cole have reviewed all documentation for this visit. The documentation on 01/02/21 for the exam, diagnosis, procedures, and orders are all accurate and complete.    IF YOU HAVE BEEN REFERRED TO A SPECIALIST, IT MAY TAKE 1-2 WEEKS TO SCHEDULE/PROCESS THE REFERRAL. IF YOU HAVE NOT HEARD FROM US/SPECIALIST IN TWO WEEKS, PLEASE GIVE [place] A CALL AT [phone].   THE PATIENT IS ENCOURAGED TO PRACTICE SOCIAL DISTANCING DUE TO THE COVID-19 PANDEMIC.

## 2021-01-08 ENCOUNTER — Other Ambulatory Visit: Payer: Self-pay

## 2021-01-08 ENCOUNTER — Ambulatory Visit: Payer: Medicaid Other | Attending: Nurse Practitioner | Admitting: Physical Therapy

## 2021-01-08 DIAGNOSIS — R2689 Other abnormalities of gait and mobility: Secondary | ICD-10-CM | POA: Diagnosis present

## 2021-01-08 DIAGNOSIS — M533 Sacrococcygeal disorders, not elsewhere classified: Secondary | ICD-10-CM | POA: Insufficient documentation

## 2021-01-08 DIAGNOSIS — M62838 Other muscle spasm: Secondary | ICD-10-CM | POA: Diagnosis present

## 2021-01-08 NOTE — Patient Instructions (Signed)
  Lengthen Back rib by R  shoulder    Lie on L  side , pillow between knees and under head  Pull  arm overhead over mattress, grab the edge of mattress,pull it upward, drawing elbow away from ears  Breathing 10 reps  Open book (handout)  Lying on  _ side , rotating  __ only this week  Rotating onto pillow /yoga block  Pillow/ Block between knees  10 reps  ___   Proper body mechanics with getting out of a chair to decrease strain  on back &pelvic floor   Avoid holding your breath when Getting out of the chair:  Scoot to front part of chair chair Heels behind knees, feet are hip width apart, nose over toes  Inhale like you are smelling roses Exhale to stand   __  Wear SIJ belt

## 2021-01-09 DIAGNOSIS — O99213 Obesity complicating pregnancy, third trimester: Secondary | ICD-10-CM | POA: Diagnosis not present

## 2021-01-09 DIAGNOSIS — Z3403 Encounter for supervision of normal first pregnancy, third trimester: Secondary | ICD-10-CM | POA: Diagnosis not present

## 2021-01-09 DIAGNOSIS — O3413 Maternal care for benign tumor of corpus uteri, third trimester: Secondary | ICD-10-CM | POA: Diagnosis not present

## 2021-01-09 DIAGNOSIS — R12 Heartburn: Secondary | ICD-10-CM | POA: Diagnosis not present
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
## 2021-01-13 DIAGNOSIS — F4323 Adjustment disorder with mixed anxiety and depressed mood: Secondary | ICD-10-CM | POA: Diagnosis not present

## 2021-01-16 DIAGNOSIS — O3413 Maternal care for benign tumor of corpus uteri, third trimester: Secondary | ICD-10-CM | POA: Diagnosis not present

## 2021-01-16 DIAGNOSIS — Z3403 Encounter for supervision of normal first pregnancy, third trimester: Secondary | ICD-10-CM | POA: Diagnosis not present

## 2021-01-16 DIAGNOSIS — O99213 Obesity complicating pregnancy, third trimester: Secondary | ICD-10-CM | POA: Diagnosis not present

## 2021-01-21 DIAGNOSIS — O99213 Obesity complicating pregnancy, third trimester: Secondary | ICD-10-CM | POA: Diagnosis not present

## 2021-01-21 DIAGNOSIS — O3413 Maternal care for benign tumor of corpus uteri, third trimester: Secondary | ICD-10-CM | POA: Diagnosis not present

## 2021-01-21 DIAGNOSIS — Z3403 Encounter for supervision of normal first pregnancy, third trimester: Secondary | ICD-10-CM | POA: Diagnosis not present

## 2021-01-22 ENCOUNTER — Encounter (HOSPITAL_COMMUNITY): Payer: Self-pay | Admitting: *Deleted
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
## 2021-01-24 DIAGNOSIS — O99213 Obesity complicating pregnancy, third trimester: Secondary | ICD-10-CM | POA: Diagnosis not present

## 2021-01-26 ENCOUNTER — Inpatient Hospital Stay (HOSPITAL_COMMUNITY): Admit: 2021-01-26 | Payer: Medicaid Other

## 2021-01-27 DIAGNOSIS — Z6836 Body mass index (BMI) 36.0-36.9, adult: Secondary | ICD-10-CM

## 2021-01-28 ENCOUNTER — Encounter (HOSPITAL_COMMUNITY): Payer: Self-pay | Admitting: Obstetrics and Gynecology

## 2021-01-28 ENCOUNTER — Other Ambulatory Visit: Payer: Self-pay

## 2021-01-28 ENCOUNTER — Inpatient Hospital Stay (HOSPITAL_COMMUNITY)
Admission: AD | Admit: 2021-01-28 | Discharge: 2021-01-31 | DRG: 806 | Disposition: A | Discharge status: Home / Self Care | Payer: Medicaid Other | Attending: Obstetrics and Gynecology | Admitting: Obstetrics and Gynecology

## 2021-01-28 DIAGNOSIS — O3413 Maternal care for benign tumor of corpus uteri, third trimester: Secondary | ICD-10-CM | POA: Diagnosis present

## 2021-01-28 DIAGNOSIS — Z20822 Contact with and (suspected) exposure to covid-19: Secondary | ICD-10-CM | POA: Diagnosis present

## 2021-01-28 DIAGNOSIS — O99213 Obesity complicating pregnancy, third trimester: Secondary | ICD-10-CM | POA: Diagnosis not present

## 2021-01-28 DIAGNOSIS — O99214 Obesity complicating childbirth: Secondary | ICD-10-CM | POA: Diagnosis present

## 2021-01-28 DIAGNOSIS — D259 Leiomyoma of uterus, unspecified: Secondary | ICD-10-CM | POA: Diagnosis not present

## 2021-01-28 DIAGNOSIS — O480 Post-term pregnancy: Secondary | ICD-10-CM | POA: Diagnosis not present

## 2021-01-28 DIAGNOSIS — O9081 Anemia of the puerperium: Secondary | ICD-10-CM | POA: Diagnosis not present

## 2021-01-28 DIAGNOSIS — O43893 Other placental disorders, third trimester: Secondary | ICD-10-CM | POA: Diagnosis not present

## 2021-01-28 DIAGNOSIS — Z3A40 40 weeks gestation of pregnancy: Secondary | ICD-10-CM | POA: Diagnosis not present

## 2021-01-28 DIAGNOSIS — O1404 Mild to moderate pre-eclampsia, complicating childbirth: Secondary | ICD-10-CM | POA: Diagnosis not present

## 2021-01-28 DIAGNOSIS — D62 Acute posthemorrhagic anemia: Secondary | ICD-10-CM | POA: Diagnosis not present

## 2021-01-28 DIAGNOSIS — Z3490 Encounter for supervision of normal pregnancy, unspecified, unspecified trimester: Secondary | ICD-10-CM | POA: Diagnosis present

## 2021-01-28 LAB — CBC
HCT: 36.2 % (ref 36.0–46.0)
Hemoglobin: 12.1 g/dL (ref 12.0–15.0)
MCH: 32.1 pg (ref 26.0–34.0)
MCHC: 33.4 g/dL (ref 30.0–36.0)
MCV: 96 fL (ref 80.0–100.0)
Platelets: 229 10*3/uL (ref 150–400)
RBC: 3.77 MIL/uL — ABNORMAL LOW (ref 3.87–5.11)
RDW: 14 % (ref 11.5–15.5)
WBC: 7.1 10*3/uL (ref 4.0–10.5)
nRBC: 0 % (ref 0.0–0.2)

## 2021-01-28 LAB — TYPE AND SCREEN
ABO/RH(D): O POS
Antibody Screen: NEGATIVE

## 2021-01-28 LAB — RESP PANEL BY RT-PCR (FLU A&B, COVID) ARPGX2
Influenza A by PCR: NEGATIVE
Influenza B by PCR: NEGATIVE
SARS Coronavirus 2 by RT PCR: NEGATIVE

## 2021-01-28 MED ORDER — MISOPROSTOL 50MCG HALF TABLET
50.0000 ug | ORAL_TABLET | ORAL | Status: DC | PRN
  Administered 2021-01-28: 50 ug via ORAL
  Filled 2021-01-28: qty 1

## 2021-01-28 MED ORDER — HYDROXYZINE HCL 50 MG PO TABS: 50.0000 mg | ORAL_TABLET | Freq: Four times a day (QID) | ORAL | Status: DC | PRN

## 2021-01-28 MED ORDER — OXYCODONE-ACETAMINOPHEN 5-325 MG PO TABS: 2.0000 | ORAL_TABLET | ORAL | Status: DC | PRN

## 2021-01-28 MED ORDER — LACTATED RINGERS IV SOLN: 500.0000 mL | INTRAVENOUS | Status: DC | PRN

## 2021-01-28 MED ORDER — LIDOCAINE HCL (PF) 1 % IJ SOLN: 30.0000 mL | INTRAMUSCULAR | Status: DC | PRN

## 2021-01-28 MED ORDER — OXYTOCIN-SODIUM CHLORIDE 30-0.9 UT/500ML-% IV SOLN: 2.5000 [IU]/h | INTRAVENOUS | Status: DC

## 2021-01-28 MED ORDER — ONDANSETRON HCL 4 MG/2ML IJ SOLN
4.0000 mg | Freq: Four times a day (QID) | INTRAMUSCULAR | Status: DC | PRN
  Administered 2021-01-29: 4 mg via INTRAVENOUS
  Filled 2021-01-28: qty 2

## 2021-01-28 MED ORDER — MISOPROSTOL 25 MCG QUARTER TABLET
25.0000 ug | ORAL_TABLET | ORAL | Status: DC | PRN
  Administered 2021-01-28: 25 ug via VAGINAL
  Filled 2021-01-28: qty 1

## 2021-01-28 MED ORDER — ACETAMINOPHEN 325 MG PO TABS: 650.0000 mg | ORAL_TABLET | ORAL | Status: DC | PRN

## 2021-01-28 MED ORDER — OXYTOCIN-SODIUM CHLORIDE 30-0.9 UT/500ML-% IV SOLN
1.0000 m[IU]/min | INTRAVENOUS | Status: DC
  Filled 2021-01-28: qty 500

## 2021-01-28 MED ORDER — OXYTOCIN BOLUS FROM INFUSION
333.0000 mL | Freq: Once | INTRAVENOUS | Status: AC
  Administered 2021-01-29: 333 mL via INTRAVENOUS

## 2021-01-28 MED ORDER — TERBUTALINE SULFATE 1 MG/ML IJ SOLN: 0.2500 mg | Freq: Once | INTRAMUSCULAR | Status: DC | PRN

## 2021-01-28 MED ORDER — SOD CITRATE-CITRIC ACID 500-334 MG/5ML PO SOLN
30.0000 mL | ORAL | Status: DC | PRN
  Administered 2021-01-29 (×2): 30 mL via ORAL
  Filled 2021-01-28 (×2): qty 30

## 2021-01-28 MED ORDER — OXYCODONE-ACETAMINOPHEN 5-325 MG PO TABS: 1.0000 | ORAL_TABLET | ORAL | Status: DC | PRN

## 2021-01-28 MED ORDER — FENTANYL CITRATE (PF) 100 MCG/2ML IJ SOLN
50.0000 ug | INTRAMUSCULAR | Status: DC | PRN
  Administered 2021-01-29: 50 ug via INTRAVENOUS
  Administered 2021-01-29: 100 ug via INTRAVENOUS
  Filled 2021-01-28 (×2): qty 2

## 2021-01-28 MED ORDER — LACTATED RINGERS IV SOLN: INTRAVENOUS | Status: DC
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
## 2021-01-29 ENCOUNTER — Inpatient Hospital Stay (HOSPITAL_COMMUNITY): Payer: Medicaid Other

## 2021-01-29 ENCOUNTER — Inpatient Hospital Stay (HOSPITAL_COMMUNITY)
Admission: AD | Admit: 2021-01-29 | Payer: Medicaid Other | Source: Home / Self Care | Admitting: Obstetrics and Gynecology

## 2021-01-29 ENCOUNTER — Inpatient Hospital Stay (HOSPITAL_COMMUNITY): Payer: Medicaid Other | Admitting: Anesthesiology

## 2021-01-29 ENCOUNTER — Encounter (HOSPITAL_COMMUNITY): Payer: Self-pay | Admitting: Obstetrics and Gynecology

## 2021-01-29 DIAGNOSIS — D649 Anemia, unspecified: Secondary | ICD-10-CM | POA: Diagnosis not present

## 2021-01-29 DIAGNOSIS — Z3A40 40 weeks gestation of pregnancy: Secondary | ICD-10-CM | POA: Diagnosis not present

## 2021-01-29 DIAGNOSIS — Z6836 Body mass index (BMI) 36.0-36.9, adult: Secondary | ICD-10-CM

## 2021-01-29 DIAGNOSIS — O9902 Anemia complicating childbirth: Secondary | ICD-10-CM | POA: Diagnosis not present

## 2021-01-29 LAB — CBC
HCT: 35.7 % — ABNORMAL LOW (ref 36.0–46.0)
Hemoglobin: 11.8 g/dL — ABNORMAL LOW (ref 12.0–15.0)
MCH: 31.8 pg (ref 26.0–34.0)
MCHC: 33.1 g/dL (ref 30.0–36.0)
MCV: 96.2 fL (ref 80.0–100.0)
Platelets: 213 10*3/uL (ref 150–400)
RBC: 3.71 MIL/uL — ABNORMAL LOW (ref 3.87–5.11)
RDW: 14.2 % (ref 11.5–15.5)
WBC: 7.5 10*3/uL (ref 4.0–10.5)
nRBC: 0 % (ref 0.0–0.2)

## 2021-01-29 LAB — PROTEIN / CREATININE RATIO, URINE
Creatinine, Urine: 85.46 mg/dL
Protein Creatinine Ratio: 0.32 mg/mg{Cre} — ABNORMAL HIGH (ref 0.00–0.15)
Total Protein, Urine: 27 mg/dL

## 2021-01-29 LAB — COMPREHENSIVE METABOLIC PANEL
ALT: 12 U/L (ref 0–44)
AST: 19 U/L (ref 15–41)
Albumin: 3.1 g/dL — ABNORMAL LOW (ref 3.5–5.0)
Alkaline Phosphatase: 190 U/L — ABNORMAL HIGH (ref 38–126)
Anion gap: 7 (ref 5–15)
BUN: <5 mg/dL — ABNORMAL LOW (ref 6–20)
CO2: 24 mmol/L (ref 22–32)
Calcium: 9 mg/dL (ref 8.9–10.3)
Chloride: 103 mmol/L (ref 98–111)
Creatinine, Ser: 0.64 mg/dL (ref 0.44–1.00)
GFR, Estimated: >60 mL/min (ref >60)
Glucose, Bld: 102 mg/dL — ABNORMAL HIGH (ref 70–99)
Potassium: 3.7 mmol/L (ref 3.5–5.1)
Sodium: 134 mmol/L — ABNORMAL LOW (ref 135–145)
Total Bilirubin: 0.6 mg/dL (ref 0.3–1.2)
Total Protein: 5.7 g/dL — ABNORMAL LOW (ref 6.5–8.1)

## 2021-01-29 LAB — RPR: RPR Ser Ql: NONREACTIVE

## 2021-01-29 MED ORDER — BENZOCAINE-MENTHOL 20-0.5 % EX AERO
1.0000 "application " | INHALATION_SPRAY | CUTANEOUS | Status: DC | PRN
  Administered 2021-01-29: 1 via TOPICAL
  Filled 2021-01-29: qty 56

## 2021-01-29 MED ORDER — OXYTOCIN-SODIUM CHLORIDE 30-0.9 UT/500ML-% IV SOLN
1.0000 m[IU]/min | INTRAVENOUS | Status: DC
Start: 2021-01-29 — End: 2021-01-29
  Administered 2021-01-29: 1 m[IU]/min via INTRAVENOUS

## 2021-01-29 MED ORDER — FENTANYL-BUPIVACAINE-NACL 0.5-0.125-0.9 MG/250ML-% EP SOLN
12.0000 mL/h | EPIDURAL | Status: DC | PRN
Start: 2021-01-29 — End: 2021-01-29
  Administered 2021-01-29: 12 mL/h via EPIDURAL
  Filled 2021-01-29: qty 250

## 2021-01-29 MED ORDER — TRANEXAMIC ACID-NACL 1000-0.7 MG/100ML-% IV SOLN: 1000.0000 mg | INTRAVENOUS | Status: AC

## 2021-01-29 MED ORDER — PHENYLEPHRINE 40 MCG/ML (10ML) SYRINGE FOR IV PUSH (FOR BLOOD PRESSURE SUPPORT): 80.0000 ug | PREFILLED_SYRINGE | INTRAVENOUS | Status: DC | PRN

## 2021-01-29 MED ORDER — PRENATAL MULTIVITAMIN CH
1.0000 | ORAL_TABLET | Freq: Every day | ORAL | Status: DC
  Administered 2021-01-31: 1 via ORAL
  Filled 2021-01-29 (×2): qty 1

## 2021-01-29 MED ORDER — IBUPROFEN 600 MG PO TABS
600.0000 mg | ORAL_TABLET | Freq: Four times a day (QID) | ORAL | Status: DC
  Administered 2021-01-29 – 2021-01-31 (×7): 600 mg via ORAL
  Filled 2021-01-29 (×7): qty 1

## 2021-01-29 MED ORDER — MISOPROSTOL 200 MCG PO TABS
ORAL_TABLET | ORAL | Status: AC
  Administered 2021-01-29: 1000 ug via RECTAL
  Filled 2021-01-29: qty 5

## 2021-01-29 MED ORDER — DIPHENHYDRAMINE HCL 50 MG/ML IJ SOLN: 12.5000 mg | INTRAMUSCULAR | Status: DC | PRN

## 2021-01-29 MED ORDER — EPHEDRINE 5 MG/ML INJ: 10.0000 mg | INTRAVENOUS | Status: DC | PRN

## 2021-01-29 MED ORDER — MISOPROSTOL 200 MCG PO TABS: 1000.0000 ug | ORAL_TABLET | Freq: Once | ORAL | Status: AC

## 2021-01-29 MED ORDER — SENNOSIDES-DOCUSATE SODIUM 8.6-50 MG PO TABS
2.0000 | ORAL_TABLET | Freq: Every day | ORAL | Status: DC
  Administered 2021-01-30 – 2021-01-31 (×2): 2 via ORAL
  Filled 2021-01-29 (×2): qty 2

## 2021-01-29 MED ORDER — COCONUT OIL OIL: 1.0000 "application " | TOPICAL_OIL | Status: DC | PRN

## 2021-01-29 MED ORDER — DIPHENHYDRAMINE HCL 25 MG PO CAPS: 25.0000 mg | ORAL_CAPSULE | Freq: Four times a day (QID) | ORAL | Status: DC | PRN

## 2021-01-29 MED ORDER — WITCH HAZEL-GLYCERIN EX PADS
1.0000 "application " | MEDICATED_PAD | CUTANEOUS | Status: DC | PRN
  Administered 2021-01-30: 1 via TOPICAL

## 2021-01-29 MED ORDER — SIMETHICONE 80 MG PO CHEW: 80.0000 mg | CHEWABLE_TABLET | ORAL | Status: DC | PRN

## 2021-01-29 MED ORDER — PHENYLEPHRINE 40 MCG/ML (10ML) SYRINGE FOR IV PUSH (FOR BLOOD PRESSURE SUPPORT)
80.0000 ug | PREFILLED_SYRINGE | INTRAVENOUS | Status: DC | PRN
Start: 2021-01-29 — End: 2021-01-29
  Filled 2021-01-29: qty 10

## 2021-01-29 MED ORDER — TERBUTALINE SULFATE 1 MG/ML IJ SOLN: 0.2500 mg | Freq: Once | INTRAMUSCULAR | Status: DC | PRN

## 2021-01-29 MED ORDER — ZOLPIDEM TARTRATE 5 MG PO TABS: 5.0000 mg | ORAL_TABLET | Freq: Every evening | ORAL | Status: DC | PRN

## 2021-01-29 MED ORDER — DIBUCAINE (PERIANAL) 1 % EX OINT
1.0000 "application " | TOPICAL_OINTMENT | CUTANEOUS | Status: DC | PRN
  Administered 2021-01-30: 1 via RECTAL
  Filled 2021-01-29: qty 28

## 2021-01-29 MED ORDER — LACTATED RINGERS IV SOLN
500.0000 mL | Freq: Once | INTRAVENOUS | Status: AC
  Administered 2021-01-29: 500 mL via INTRAVENOUS

## 2021-01-29 MED ORDER — ONDANSETRON HCL 4 MG/2ML IJ SOLN: 4.0000 mg | INTRAMUSCULAR | Status: DC | PRN

## 2021-01-29 MED ORDER — ACETAMINOPHEN 325 MG PO TABS
650.0000 mg | ORAL_TABLET | ORAL | Status: DC | PRN
  Administered 2021-01-30: 650 mg via ORAL
  Filled 2021-01-29: qty 2

## 2021-01-29 MED ORDER — TRANEXAMIC ACID-NACL 1000-0.7 MG/100ML-% IV SOLN
INTRAVENOUS | Status: AC
  Administered 2021-01-29: 1000 mg via INTRAVENOUS
  Filled 2021-01-29: qty 100

## 2021-01-29 MED ORDER — EPHEDRINE 5 MG/ML INJ
10.0000 mg | INTRAVENOUS | Status: DC | PRN
Start: 2021-01-29 — End: 2021-01-29

## 2021-01-29 MED ORDER — ONDANSETRON HCL 4 MG PO TABS: 4.0000 mg | ORAL_TABLET | ORAL | Status: DC | PRN

## 2021-01-29 MED ORDER — LIDOCAINE HCL (PF) 1 % IJ SOLN
INTRAMUSCULAR | Status: DC | PRN
  Administered 2021-01-29 (×2): 5 mL via EPIDURAL

## 2021-01-29 NOTE — Progress Notes (Signed)
OB Progress Note  S: Patient uncomfortable with contractions. Desires epidural. Consents to AROM.  O: BP (!) 145/88   Pulse 71   Temp 99.1 F (37.3 C)   Resp 18   Ht 5\' 3"  (1.6 m)   Wt 109.8 kg   LMP 04/21/2020 (Within Days)   BMI 42.87 kg/m   FHT: 140bpm, moderate variablity, + accels, - decels Toco: q1.5-2 minutes SVE: 6/90/-3, sutures palpated AROM: Clear, non-odorous  A/P: 30 y.o. G1P0 @ [redacted]w[redacted]d admitted for induction of labor for 6/8 BPP (off for breathing and grade 3/calcified placenta.  FWB: Cat. I Labor course: S/p Cytotec vaginally x 1 and buccal x 1, Pitocin at 23mU/min, AROM performed now  Pain: Desires epidural GBS: Negative Anticipate SVD  Drema Dallas, DO

## 2021-01-29 NOTE — Anesthesia Preprocedure Evaluation (Signed)
Anesthesia Evaluation  Patient identified by MRN, date of birth, ID band Patient awake    Reviewed: Allergy & Precautions, NPO status , Patient's Chart, lab work & pertinent test results  Airway Mallampati: II  TM Distance: >3 FB Neck ROM: Full    Dental no notable dental hx. (+) Dental Advisory Given   Pulmonary neg pulmonary ROS,    Pulmonary exam normal        Cardiovascular negative cardio ROS Normal cardiovascular exam     Neuro/Psych negative neurological ROS  negative psych ROS   GI/Hepatic negative GI ROS, Neg liver ROS,   Endo/Other  negative endocrine ROS  Renal/GU negative Renal ROS  negative genitourinary   Musculoskeletal negative musculoskeletal ROS (+)   Abdominal   Peds negative pediatric ROS (+)  Hematology negative hematology ROS (+) anemia ,   Anesthesia Other Findings   Reproductive/Obstetrics (+) Pregnancy                             Anesthesia Physical Anesthesia Plan  ASA: 3  Anesthesia Plan: Epidural   Post-op Pain Management:    Induction:   PONV Risk Score and Plan:   Airway Management Planned: Natural Airway  Additional Equipment:   Intra-op Plan:   Post-operative Plan:   Informed Consent: I have reviewed the patients History and Physical, chart, labs and discussed the procedure including the risks, benefits and alternatives for the proposed anesthesia with the patient or authorized representative who has indicated his/her understanding and acceptance.       Plan Discussed with: Anesthesiologist  Anesthesia Plan Comments:         Anesthesia Quick Evaluation

## 2021-01-29 NOTE — Anesthesia Procedure Notes (Signed)
Epidural Patient location during procedure: OB Start time: 01/29/2021 9:17 AM End time: 01/29/2021 9:36 AM  Staffing Anesthesiologist: Duane Boston, MD Performed: anesthesiologist   Preanesthetic Checklist Completed: patient identified, IV checked, site marked, risks and benefits discussed, monitors and equipment checked, pre-op evaluation and timeout performed  Epidural Patient position: sitting Prep: DuraPrep Patient monitoring: heart rate, cardiac monitor, continuous pulse ox and blood pressure Approach: midline Location: L2-L3 Injection technique: LOR saline  Needle:  Needle type: Tuohy  Needle gauge: 17 G Needle length: 9 cm Needle insertion depth: 6 cm Catheter size: 20 Guage Catheter at skin depth: 11 cm Test dose: negative and Other  Assessment Events: blood not aspirated, injection not painful, no injection resistance and negative IV test  Additional Notes Informed consent obtained prior to proceeding including risk of failure, 1% risk of PDPH, risk of minor discomfort and bruising.  Discussed rare but serious complications including epidural abscess, permanent nerve injury, epidural hematoma.  Discussed alternatives to epidural analgesia and patient desires to proceed.  Timeout performed pre-procedure verifying patient name, procedure, and platelet count.  Patient tolerated procedure well.

## 2021-01-29 NOTE — Lactation Note (Signed)
This note was copied from a baby's chart. Lactation Consultation Note  Patient Name: Carolyn Cole [id number]'Y Date: 01/29/2021 Reason for consult: L&D Initial assessment Age:29 hours  P1, Baby eager and cueing.  Assisted with latching. Baby was able to sustain latch with intermittent swallows. Lactation to follow up on MBU.  Maternal Data Does the patient have breastfeeding experience prior to this delivery?: No  Feeding Mother's Current Feeding Choice: Breast Milk  LATCH Score Latch: Grasps breast easily, tongue down, lips flanged, rhythmical sucking.  Audible Swallowing: A few with stimulation  Type of Nipple: Everted at rest and after stimulation  Comfort (Breast/Nipple): Soft / non-tender  Hold (Positioning): Assistance needed to correctly position infant at breast and maintain latch.  LATCH Score: 8   Interventions Interventions: Assisted with latch;Skin to skin;Education  Consult Status Consult Status: Follow-up from L&D    Vivianne Master [facility] 01/29/2021, 2:11 PM

## 2021-01-30 ENCOUNTER — Encounter (HOSPITAL_COMMUNITY): Payer: Self-pay | Admitting: Obstetrics and Gynecology

## 2021-01-30 LAB — CBC
HCT: 29 % — ABNORMAL LOW (ref 36.0–46.0)
Hemoglobin: 9.7 g/dL — ABNORMAL LOW (ref 12.0–15.0)
MCH: 32.6 pg (ref 26.0–34.0)
MCHC: 33.4 g/dL (ref 30.0–36.0)
MCV: 97.3 fL (ref 80.0–100.0)
Platelets: 176 K/uL (ref 150–400)
RBC: 2.98 MIL/uL — ABNORMAL LOW (ref 3.87–5.11)
RDW: 14.3 % (ref 11.5–15.5)
WBC: 10.9 K/uL — ABNORMAL HIGH (ref 4.0–10.5)
nRBC: 0 % (ref 0.0–0.2)

## 2021-01-30 LAB — LACTATE DEHYDROGENASE: LDH: 228 U/L — ABNORMAL HIGH (ref 98–192)

## 2021-01-30 MED ORDER — FERROUS SULFATE 325 (65 FE) MG PO TABS: 325.0000 mg | ORAL_TABLET | Freq: Two times a day (BID) | ORAL | 3 refills | Status: DC

## 2021-01-30 MED ORDER — DOCUSATE SODIUM 100 MG PO CAPS
100.0000 mg | ORAL_CAPSULE | Freq: Two times a day (BID) | ORAL | 3 refills | Status: DC
Start: 2021-01-30 — End: 2021-03-17

## 2021-01-30 MED ORDER — IBUPROFEN 600 MG PO TABS: 600.0000 mg | ORAL_TABLET | Freq: Four times a day (QID) | ORAL | 3 refills | Status: DC

## 2021-01-30 MED ORDER — FERROUS SULFATE 325 (65 FE) MG PO TABS
325.0000 mg | ORAL_TABLET | Freq: Every day | ORAL | Status: DC
  Administered 2021-01-30 – 2021-01-31 (×2): 325 mg via ORAL
  Filled 2021-01-30 (×2): qty 1

## 2021-01-30 NOTE — Lactation Note (Signed)
This note was copied from a baby's chart. Lactation Consultation Note  Patient Name: Carolyn Cole [id number] Date: 01/30/2021 Reason for consult: Initial assessment;1st time breastfeeding;Term;Hyperbilirubinemia (DAT+ on billi lights) Age:29 hours P1, term female infant. Mom hand expressed a smear of colostrum that was placed on infant's gums. Per mom, she breastfeed infant at 2100 for 30 minutes, infant under billi lights, infant was given 13 mls of Similac 20 kcal formula. Mom was set up with DEBP and understands to pump every 3 hours for 15 minutes on initial setting. Mom started to see few drops of colostrum when pumping. Mom understands feeding plan can changed based on infant's feeding behaviors. Mom shown how to use DEBP & how to disassemble, clean, & reassemble parts.  Mom's feed plan: 1- Mom will continue to latching infant at breast according to feeding cues, 8 to 12+ or more times within 24 hours, skin to skin. 2- Mom will supplement infant with formula after latching infant at the breast, "Breastfeeding Supplement Amounts Sheet "given.  3- Mom will continue to use DEBP and pump every 3 hours for 15 minutes on initial setting and will give infant back any EBM first before offering formula. 4- Mom knows to call RN/LC for latch assistance if needed.   Maternal Data Has patient been taught Hand Expression?: Yes Does the patient have breastfeeding experience prior to this delivery?: No  Feeding Mother's Current Feeding Choice: Breast Milk and Formula  LATCH Score                    Lactation Tools Discussed/Used Tools: Pump Breast pump type: Double-Electric Breast Pump Pump Education: Setup, frequency, and cleaning;Milk Storage Reason for Pumping: Infant DAT+ Pumping frequency: Mom knows to pump every 3 hours for 15 minutes on inital setting.  Interventions    Discharge Pump: DEBP;Personal WIC Program: No  Consult Status Consult Status:  Follow-up Date: 01/30/21 Follow-up type: In-patient    Vicente Serene 01/30/2021, 12:07 AM

## 2021-01-30 NOTE — Anesthesia Postprocedure Evaluation (Signed)
Anesthesia Post Note  Patient: Carolyn Cole  Procedure(s) Performed: AN AD HOC LABOR EPIDURAL     Patient location during evaluation: Mother Baby Anesthesia Type: Epidural Level of consciousness: awake and alert Pain management: pain level controlled Vital Signs Assessment: post-procedure vital signs reviewed and stable Respiratory status: spontaneous breathing, nonlabored ventilation and respiratory function stable Cardiovascular status: stable Postop Assessment: no headache, no backache and epidural receding Anesthetic complications: no   No notable events documented.  Last Vitals:  Vitals:   01/29/21 2352 01/30/21 0618  BP: 121/85 124/80  Pulse: 81 70  Resp: 16 18  Temp: 36.9 C 37.1 C  SpO2: 99% 99%    Last Pain:  Vitals:   01/30/21 0618  TempSrc: Oral  PainSc:    Pain Goal: Patients Stated Pain Goal: 0 (01/29/21 2049)                 Barkley Boards

## 2021-01-30 NOTE — Lactation Note (Addendum)
This note was copied from a baby's chart. Lactation Consultation Note  Patient Name: Carolyn Cole [id number] Date: 01/30/2021 Reason for consult: Follow-up assessment;Mother's request;Term;Hyperbilirubinemia Age:29 hours  LC not able to see a latch infant just completed a feeding. Mom latched infant for 30 min and supplemented with formula increasing volume to 25 ml just prior to [facility] arrival. Infant adequate urine and stool output today.   Infant feeding volumes today been between 5-15 ml following time at breast. Parents are offering any collected colostrum in flange first followed by formula.   Plan 1. To feed based on cues 8-12x 24hr period. Parents to offer breast first and then supplement with EBM followed by formula with pace bottle feeding with slow flow nipple. Mom aware to supplement after each feeding working to offer more volume. BF supplementation guide provided.  2. Mom to pump with DEBP q 3hrs for 15 min.  All questions answered at the end of the visit.  Next bili check at 5 am.   Maternal Data    Feeding Mother's Current Feeding Choice: Breast Milk and Formula Nipple Type: Slow - flow  LATCH Score Latch: Repeated attempts needed to sustain latch, nipple held in mouth throughout feeding, stimulation needed to elicit sucking reflex.  Audible Swallowing: A few with stimulation  Type of Nipple: Everted at rest and after stimulation  Comfort (Breast/Nipple): Soft / non-tender  Hold (Positioning): No assistance needed to correctly position infant at breast.  LATCH Score: 8   Lactation Tools Discussed/Used Tools: Pump;Flanges Breast pump type: Double-Electric Breast Pump Pump Education: Setup, frequency, and cleaning;Milk Storage Reason for Pumping: increase stimulation Pumping frequency: every 3 hrs for 15 min  Interventions Interventions: Breast feeding basics reviewed;Support pillows;Education;Pace feeding;Expressed Multimedia programmer  education  Discharge    Consult Status Consult Status: Follow-up Date: 01/31/21 Follow-up type: In-patient    Carolyn Glahn  Cole 01/30/2021, 10:51 PM

## 2021-01-30 NOTE — Progress Notes (Signed)
Postpartum Note Day #1  S:  Patient doing well.  Pain controlled.  Tolerating regular diet.   Ambulating and voiding without difficulty. Infant under bilirubin lights. Denies fevers, chills, chest pain, SOB, N/V, or worsening bilateral LE edema.  Lochia: Minimal Infant feeding:  Breast and bottle Circumcision:  N/A, female infant Contraception:  Condoms  O: Temp:  [98.1 F (36.7 C)-98.9 F (37.2 C)] 98.7 F (37.1 C) (10/20 0618) Pulse Rate:  [66-139] 70 (10/20 0618) Resp:  [16-20] 18 (10/20 0618) BP: (105-154)/(60-121) 124/80 (10/20 0618) SpO2:  [98 %-100 %] 99 % (10/20 0618) Gen: NAD, pleasant and cooperative CV: RRR Resp: CTAB, no wheezes/rales/rhonchi Abdomen: soft, non-distended, non-tender throughout Uterus: firm, non-tender, below umbilicus, enlarged fibroid on right palpated above umbilicus Ext: Trace bilateral LE edema, no bilateral calf tenderness, SCDs on and working  Labs:  Recent Labs    01/29/21 0640 01/30/21 0459  HGB 11.8* 9.7*    A/P: Patient is a 30 y.o. G1P1001 PPD#1 s/p SVD.  S/p SVD - Pain well controlled  - GU: UOP is adequate - GI: Tolerating regular diet - Activity: encouraged sitting up to chair and ambulation as tolerated - DVT Prophylaxis: Frequent ambulation, SCDs in bed - Labs: acute blood loss anemia - oral iron ordered  Preeclampsia without SF - Preeclampsia labs unremarkable, P/C ratio 0.3 - Normotensive since delivery - Reviewed possible need for antihypertensive medication - Discussed implications of preeclampsia without SF in future pregnancies - Will arrange a 1 week BP check   Disposition:  D/C home PPD#2   Drema Dallas, DO [phone] (office)

## 2021-01-31 ENCOUNTER — Ambulatory Visit: Payer: Self-pay

## 2021-01-31 LAB — SURGICAL PATHOLOGY

## 2021-01-31 NOTE — Lactation Note (Signed)
This note was copied from a baby's chart. Lactation Consultation Note  Patient Name: Girl Yasmin Bronaugh SLHTD'S Date: 01/31/2021 Reason for consult: Engorgement Age:29 hours  Follow up with P1 severely engorged. Mother states she is pumping but does not seem to collect more than 54mL combined.LC noted firm, warm, engorged breasts. Mother explains they are very uncomfortable.  Ice bags to breast for 15 minutes, massage with reverse pressure using coconut oil. LC reviewed how to use a DEBP and checked flange size. Mother fitted for 21 mm, but may use 24 mm. Observed flanges getting easily misplaced. Once that was identified, mother was able to collect 69 mL of EBM combined.   LC discussed pumping following maintenance setting as well as using manual pump as needed.  Encouraged to request [facility] for any needs, support or questions. Praised mother for her milk supply, effort and dedication.   Plan: 1-Apply ice to breast for 15 minutes at a time at least 30 minutes prior to pumping.  2-Massage breasts using coconut oil prior to pumping. 3-Pump 8-12 in 24 h following maintenance setting. 4-Only use heat while actively pumping.   All questions answered at this time.    Feeding Mother's Current Feeding Choice: Breast Milk and Formula  Lactation Tools Discussed/Used Tools: Pump;Flanges;Coconut oil;Hands-free pumping top Flange Size: 24;21 Breast pump type: Double-Electric Breast Pump;Manual Pump Education: Milk Storage;Setup, frequency, and cleaning Reason for Pumping: engorgement Pumping frequency: Q3 Pumped volume: 69 mL  Interventions Interventions: Ice;DEBP;Hand pump;Coconut oil;Expressed milk;Education;Breast massage;Hand express;Reverse pressure  Discharge Discharge Education: Engorgement and breast care Pump: Personal;Manual;DEBP WIC Program: Yes  Consult Status Consult Status: Follow-up Date: 02/01/21 Follow-up type: In-patient    Fayelynn Distel A Higuera Ancidey 01/31/2021,  4:59 PM

## 2021-01-31 NOTE — Discharge Summary (Signed)
Postpartum Discharge Summary  Date of Service updated 01/31/2021     Patient Name: Carolyn Cole DOB: 19-Aug-1990 MRN: [id number]  Date of admission: 01/28/2021 Delivery date:01/29/2021  Delivering provider: Drema Dallas  Date of discharge: 01/31/2021  Admitting diagnosis: Encounter for induction of labor [Z34.90] Intrauterine pregnancy: [redacted]w[redacted]d    Secondary diagnosis:  Active Problems:   Encounter for induction of labor  Additional problems: postpartum hemorrhage    Discharge diagnosis: Term Pregnancy Delivered and Preeclampsia (mild)                                              Post partum procedures: None Augmentation: Pitocin and Cytotec Complications: None  Hospital course: Induction of Labor With Vaginal Delivery   30y.o. yo G1P1001 at 427w3das admitted to the hospital 01/28/2021 for induction of labor.  Indication for induction:  nonreassuring fetal testing bpp 6  out 8  .  Patient had an uncomplicated labor course as follows: Membrane Rupture Time/Date: 7:56 AM ,01/29/2021   Delivery Method:Vaginal, Spontaneous  Episiotomy: None  Lacerations:  2nd degree  Details of delivery can be found in separate delivery note.  Patient had a routine postpartum course. Patient is discharged home 01/31/21.  Newborn Data: Birth date:01/29/2021  Birth time:1:29 PM  Gender:Female  Living status:Living  Apgars:9 ,9  Weight:3600 g   Magnesium Sulfate received: No BMZ received: No Rhophylac:N/A MMR:N/A T-DaP:Given prenatally Flu: N/A Transfusion:No  Physical exam  Vitals:   01/29/21 2352 01/30/21 0618 01/30/21 2120 01/31/21 0513  BP: 121/85 124/80 131/79 132/78  Pulse: 81 70 80 86  Resp: '16 18 19 18  ' Temp: 98.5 F (36.9 C) 98.7 F (37.1 C) 98.3 F (36.8 C) 98.3 F (36.8 C)  TempSrc: Oral Oral Oral Oral  SpO2: 99% 99% 100% 99%  Weight:      Height:       General: alert, cooperative, and no distress Lochia: appropriate Uterine Fundus: firm Incision:  N/A DVT Evaluation: No evidence of DVT seen on physical exam. Labs: Lab Results  Component Value Date   WBC 10.9 (H) 01/30/2021   HGB 9.7 (L) 01/30/2021   HCT 29.0 (L) 01/30/2021   MCV 97.3 01/30/2021   PLT 176 01/30/2021   CMP Latest Ref Rng & Units 01/29/2021  Glucose 70 - 99 mg/dL 102(H)  BUN 6 - 20 mg/dL <5(L)  Creatinine 0.44 - 1.00 mg/dL 0.64  Sodium 135 - 145 mmol/L 134(L)  Potassium 3.5 - 5.1 mmol/L 3.7  Chloride 98 - 111 mmol/L 103  CO2 22 - 32 mmol/L 24  Calcium 8.9 - 10.3 mg/dL 9.0  Total Protein 6.5 - 8.1 g/dL 5.7(L)  Total Bilirubin 0.3 - 1.2 mg/dL 0.6  Alkaline Phos 38 - 126 U/L 190(H)  AST 15 - 41 U/L 19  ALT 0 - 44 U/L 12   Edinburgh Score: Edinburgh Postnatal Depression Scale Screening Tool 01/29/2021  I have been able to laugh and see the funny side of things. 0  I have looked forward with enjoyment to things. 0  I have blamed myself unnecessarily when things went wrong. 1  I have been anxious or worried for no good reason. 2  I have felt scared or panicky for no good reason. 1  Things have been getting on top of me. 1  I have been so unhappy that I have had difficulty  sleeping. 0  I have felt sad or miserable. 1  I have been so unhappy that I have been crying. 1  The thought of harming myself has occurred to me. 0  Edinburgh Postnatal Depression Scale Total 7      After visit meds:  Allergies as of 01/31/2021   No Known Allergies      Medication List     TAKE these medications    docusate sodium 100 MG capsule Commonly known as: Colace Take 1 capsule (100 mg total) by mouth 2 (two) times daily.   ferrous sulfate 325 (65 FE) MG tablet Take 1 tablet (325 mg total) by mouth 2 (two) times daily with a meal.   ibuprofen 600 MG tablet Commonly known as: ADVIL Take 1 tablet (600 mg total) by mouth every 6 (six) hours.   prenatal multivitamin Tabs tablet Take 1 tablet by mouth daily at 12 noon.         Discharge home in stable  condition Infant Feeding: Breast Infant Disposition:home with mother Discharge instruction: per After Visit Summary and Postpartum booklet. Activity: Advance as tolerated. Pelvic rest for 6 weeks.  Diet: iron rich diet Anticipated Birth Control: Unsure Postpartum Appointment:1 week Additional Postpartum F/U: BP check 1 week Future Appointments: Future Appointments  Date Time Provider Ranger  02/12/2021 12:00 PM [name], PT [facility] None  02/19/2021  8:00 AM Aurea Graff, Shin-Yi[place], PT [facility] None  03/03/2021  8:00 AM Aurea Graff, Shin-Yiing, PT [facility] None  03/10/2021  9:00 AM Aurea Graff, Shin-Yiing, PT [facility] None  03/17/2021  9:00 AM Aurea Graff, Shin-Yiing, PT [facility] None  03/24/2021  9:00 AM Aurea Graff, Shin-Yiing, PT [facility] None  03/31/2021  9:00 AM [name], PT [facility] None  04/10/2021  9:00 AM [name], PT [facility] None   Follow up Visit:  Follow-up Information     Drema Dallas, DO Follow up in 1 week(s).   Specialty: Obstetrics and Gynecology Why: Our office will arrange a 1 week BP check for you. Please keep your previously scheduled 6 week PPV as well. Contact information: [address] [place] [address] [phone]                     01/31/2021 Christophe Louis, MD

## 2021-02-08 ENCOUNTER — Observation Stay (HOSPITAL_COMMUNITY)
Admission: AD | Admit: 2021-02-08 | Discharge: 2021-02-09 | Disposition: A | Discharge status: Home / Self Care | Payer: Medicaid Other | Attending: Obstetrics and Gynecology | Admitting: Obstetrics and Gynecology

## 2021-02-08 ENCOUNTER — Other Ambulatory Visit: Payer: Self-pay

## 2021-02-08 ENCOUNTER — Encounter (HOSPITAL_COMMUNITY): Payer: Self-pay | Admitting: Emergency Medicine

## 2021-02-08 DIAGNOSIS — I10 Essential (primary) hypertension: Principal | ICD-10-CM | POA: Insufficient documentation

## 2021-02-08 DIAGNOSIS — R519 Headache, unspecified: Secondary | ICD-10-CM | POA: Diagnosis not present

## 2021-02-08 DIAGNOSIS — Z79899 Other long term (current) drug therapy: Secondary | ICD-10-CM | POA: Insufficient documentation

## 2021-02-08 DIAGNOSIS — O9089 Other complications of the puerperium, not elsewhere classified: Secondary | ICD-10-CM | POA: Diagnosis not present

## 2021-02-08 DIAGNOSIS — Z20822 Contact with and (suspected) exposure to covid-19: Secondary | ICD-10-CM | POA: Diagnosis not present

## 2021-02-08 DIAGNOSIS — O1495 Unspecified pre-eclampsia, complicating the puerperium: Secondary | ICD-10-CM | POA: Diagnosis present

## 2021-02-08 DIAGNOSIS — J45909 Unspecified asthma, uncomplicated: Secondary | ICD-10-CM | POA: Insufficient documentation

## 2021-02-08 DIAGNOSIS — O894 Spinal and epidural anesthesia-induced headache during the puerperium: Secondary | ICD-10-CM | POA: Diagnosis not present

## 2021-02-08 DIAGNOSIS — O1415 Severe pre-eclampsia, complicating the puerperium: Secondary | ICD-10-CM | POA: Diagnosis not present

## 2021-02-08 LAB — CBC WITH DIFFERENTIAL/PLATELET
Abs Immature Granulocytes: 0.1 10*3/uL — ABNORMAL HIGH (ref 0.00–0.07)
Basophils Absolute: 0 10*3/uL (ref 0.0–0.1)
Basophils Relative: 1 %
Eosinophils Absolute: 0.3 10*3/uL (ref 0.0–0.5)
Eosinophils Relative: 6 %
HCT: 40 % (ref 36.0–46.0)
Hemoglobin: 13.4 g/dL (ref 12.0–15.0)
Immature Granulocytes: 2 %
Lymphocytes Relative: 23 %
Lymphs Abs: 1.3 10*3/uL (ref 0.7–4.0)
MCH: 32.8 pg (ref 26.0–34.0)
MCHC: 33.5 g/dL (ref 30.0–36.0)
MCV: 98 fL (ref 80.0–100.0)
Monocytes Absolute: 0.6 10*3/uL (ref 0.1–1.0)
Monocytes Relative: 11 %
Neutro Abs: 3.3 10*3/uL (ref 1.7–7.7)
Neutrophils Relative %: 57 %
Platelets: 281 10*3/uL (ref 150–400)
RBC: 4.08 MIL/uL (ref 3.87–5.11)
RDW: 14.8 % (ref 11.5–15.5)
WBC: 5.7 10*3/uL (ref 4.0–10.5)
nRBC: 0 % (ref 0.0–0.2)

## 2021-02-08 LAB — COMPREHENSIVE METABOLIC PANEL
ALT: 47 U/L — ABNORMAL HIGH (ref 0–44)
AST: 35 U/L (ref 15–41)
Albumin: 4.1 g/dL (ref 3.5–5.0)
Alkaline Phosphatase: 129 U/L — ABNORMAL HIGH (ref 38–126)
Anion gap: 8 (ref 5–15)
BUN: 5 mg/dL — ABNORMAL LOW (ref 6–20)
CO2: 27 mmol/L (ref 22–32)
Calcium: 9.5 mg/dL (ref 8.9–10.3)
Chloride: 105 mmol/L (ref 98–111)
Creatinine, Ser: 0.72 mg/dL (ref 0.44–1.00)
GFR, Estimated: >60 mL/min (ref >60)
Glucose, Bld: 92 mg/dL (ref 70–99)
Potassium: 3.8 mmol/L (ref 3.5–5.1)
Sodium: 140 mmol/L (ref 135–145)
Total Bilirubin: 0.8 mg/dL (ref 0.3–1.2)
Total Protein: 6.4 g/dL — ABNORMAL LOW (ref 6.5–8.1)

## 2021-02-08 LAB — RESP PANEL BY RT-PCR (FLU A&B, COVID) ARPGX2
Influenza A by PCR: NEGATIVE
Influenza B by PCR: NEGATIVE
SARS Coronavirus 2 by RT PCR: NEGATIVE

## 2021-02-08 MED ORDER — HYDRALAZINE HCL 20 MG/ML IJ SOLN: 5.0000 mg | INTRAMUSCULAR | Status: DC | PRN

## 2021-02-08 MED ORDER — SODIUM CHLORIDE 0.9% FLUSH: 3.0000 mL | INTRAVENOUS | Status: DC | PRN

## 2021-02-08 MED ORDER — MAGNESIUM SULFATE BOLUS VIA INFUSION
4.0000 g | Freq: Once | INTRAVENOUS | Status: AC
  Administered 2021-02-08: 4 g via INTRAVENOUS
  Filled 2021-02-08: qty 1000

## 2021-02-08 MED ORDER — HYDRALAZINE HCL 20 MG/ML IJ SOLN: 10.0000 mg | INTRAMUSCULAR | Status: DC | PRN

## 2021-02-08 MED ORDER — LABETALOL HCL 5 MG/ML IV SOLN: 20.0000 mg | INTRAVENOUS | Status: DC | PRN

## 2021-02-08 MED ORDER — NIFEDIPINE ER OSMOTIC RELEASE 30 MG PO TB24
30.0000 mg | ORAL_TABLET | Freq: Every day | ORAL | Status: DC
  Administered 2021-02-08 – 2021-02-09 (×2): 30 mg via ORAL
  Filled 2021-02-08 (×2): qty 1

## 2021-02-08 MED ORDER — ACETAMINOPHEN 500 MG PO TABS
1000.0000 mg | ORAL_TABLET | Freq: Once | ORAL | Status: AC
  Administered 2021-02-08: 1000 mg via ORAL
  Filled 2021-02-08: qty 2

## 2021-02-08 MED ORDER — LACTATED RINGERS IV SOLN: INTRAVENOUS | Status: DC

## 2021-02-08 MED ORDER — SODIUM CHLORIDE 0.9 % IV SOLN: 250.0000 mL | INTRAVENOUS | Status: DC | PRN

## 2021-02-08 MED ORDER — LABETALOL HCL 5 MG/ML IV SOLN: 40.0000 mg | INTRAVENOUS | Status: DC | PRN

## 2021-02-08 MED ORDER — MAGNESIUM SULFATE 40 GM/1000ML IV SOLN
2.0000 g/h | INTRAVENOUS | Status: AC
  Administered 2021-02-09: 2 g/h via INTRAVENOUS
  Filled 2021-02-08 (×4): qty 1000

## 2021-02-08 MED ORDER — ACETAMINOPHEN 500 MG PO TABS
1000.0000 mg | ORAL_TABLET | Freq: Four times a day (QID) | ORAL | Status: DC | PRN
  Administered 2021-02-08 – 2021-02-09 (×3): 1000 mg via ORAL
  Filled 2021-02-08 (×3): qty 2

## 2021-02-08 MED ORDER — SODIUM CHLORIDE 0.9% FLUSH: 3.0000 mL | Freq: Two times a day (BID) | INTRAVENOUS | Status: DC

## 2021-02-08 MED ORDER — SODIUM CHLORIDE 0.9 % IV BOLUS
1000.0000 mL | Freq: Once | INTRAVENOUS | Status: AC
  Administered 2021-02-08: 1000 mL via INTRAVENOUS

## 2021-02-08 NOTE — ED Provider Notes (Signed)
[facility] EMERGENCY DEPARTMENT Provider Note   CSN: [id number] Arrival date & time: 02/08/21  1039     History No chief complaint on file.   Carolyn Cole is a 30 y.o. female.  30 year old female with prior medical history as detailed below presents for evaluation.  Patient is 10 days postpartum.  Patient reports onset of global headache approximately 5 days prior.  Patient also reports noted hypertension.  She was apparently seen in the GYN clinic yesterday.  She had intermittent hypertension during exam.  She presents today complaining of continued elevated BP.  She denies prior history of hypertension.  She denies fever.  She denies chest pain or shortness of breath.  Patient's headache is constant.  There is no change with positioning of her head or body.  She has not taken anything for her symptoms at home.  She denies visual change, focal weakness, or difficulty with ambulation.  The history is provided by the patient.  Illness Location:  Headache, elevated BP, 10 days postpartum Severity:  Mild Onset quality:  Gradual Duration:  5 days Timing:  Constant Progression:  Waxing and waning Chronicity:  New     Past Medical History:  Diagnosis Date   Anemia    Fibroids    Papilledema, both eyes    Vertigo     Patient Active Problem List   Diagnosis Date Noted   Encounter for induction of labor 01/28/2021   BMI 36.0-36.9,adult 01/27/2021   Fibroids 06/04/2018   Asthma 05/18/2018   Intramural leiomyoma of uterus 05/18/2018   Personal history of MRSA (methicillin resistant Staphylococcus aureus) 05/18/2018    Past Surgical History:  Procedure Laterality Date   APPENDECTOMY       OB History     Gravida  1   Para  1   Term  1   Preterm      AB      Living  1      SAB      IAB      Ectopic      Multiple  0   Live Births  1           Family History  Problem Relation Age of Onset   Thyroid disease Mother     Hypertension Mother    Hypertension Father    Ulcers Father    Gout Paternal Grandmother    Chiari malformation Sister     Social History   Tobacco Use   Smoking status: Never   Smokeless tobacco: Never  Vaping Use   Vaping Use: Never used  Substance Use Topics   Alcohol use: Not Currently   Drug use: Never    Home Medications Prior to Admission medications   Medication Sig Start Date End Date Taking? Authorizing Provider  docusate sodium (COLACE) 100 MG capsule Take 1 capsule (100 mg total) by mouth 2 (two) times daily. 01/30/21   Drema Dallas, DO  ferrous sulfate 325 (65 FE) MG tablet Take 1 tablet (325 mg total) by mouth 2 (two) times daily with a meal. 01/30/21   Drema Dallas, DO  ibuprofen (ADVIL) 600 MG tablet Take 1 tablet (600 mg total) by mouth every 6 (six) hours. 01/30/21   Drema Dallas, DO  Prenatal Vit-Fe Fumarate-FA (PRENATAL MULTIVITAMIN) TABS tablet Take 1 tablet by mouth daily at 12 noon.    [provider]    Allergies    Patient has no known allergies.  Review of Systems   Review  of Systems  All other systems reviewed and are negative.  Physical Exam Updated Vital Signs BP (!) 157/102   Pulse 65   Temp 99.2 F (37.3 C) (Oral)   Resp (!) 21   SpO2 99%   Physical Exam  ED Results / Procedures / Treatments   Labs (all labs ordered are listed, but only abnormal results are displayed) Labs Reviewed  CBC WITH DIFFERENTIAL/PLATELET - Abnormal; Notable for the following components:      Result Value   Abs Immature Granulocytes 0.10 (*)    All other components within normal limits  COMPREHENSIVE METABOLIC PANEL - Abnormal; Notable for the following components:   BUN 5 (*)    Total Protein 6.4 (*)    ALT 47 (*)    Alkaline Phosphatase 129 (*)    All other components within normal limits    EKG EKG Interpretation  Date/Time:  Saturday February 08 2021 11:48:08 EDT Ventricular Rate:  67 PR Interval:  142 QRS Duration: 88 QT  Interval:  364 QTC Calculation: 384 R Axis:   38 Text Interpretation: Normal sinus rhythm Low voltage QRS Borderline ECG Confirmed by Dene Gentry [id number]) on 02/08/2021 1:14:24 PM  Radiology No results found.  Procedures Procedures   Medications Ordered in ED Medications  sodium chloride 0.9 % bolus 1,000 mL (0 mLs Intravenous Stopped 02/08/21 1451)  acetaminophen (TYLENOL) tablet 1,000 mg (1,000 mg Oral Given 02/08/21 1337)    ED Course  I have reviewed the triage vital signs and the nursing notes.  Pertinent labs & imaging results that were available during my care of the patient were reviewed by me and considered in my medical decision making (see chart for details).    MDM Rules/Calculators/A&P                           MDM  MSE complete  Carolyn Cole was evaluated in Emergency Department on 02/08/2021 for the symptoms described in the history of present illness. She was evaluated in the context of the global COVID-19 pandemic, which necessitated consideration that the patient might be at risk for infection with the SARS-CoV-2 virus that causes COVID-19. Institutional protocols and algorithms that pertain to the evaluation of patients at risk for COVID-19 are in a state of rapid change based on information released by regulatory bodies including the CDC and federal and state organizations. These policies and algorithms were followed during the patient's care in the ED.  Patient is presenting with complaint of headache and associated elevated BP.  She is 10 days postpartum.  Case discussed with Dr. Delora Fuel of [place].  Patient will be seen by Dr. Delora Fuel here in the ED.  Patient to be admitted for continued care.  Final Clinical Impression(s) / ED Diagnoses Final diagnoses:  Hypertension, unspecified type    Rx / DC Orders ED Discharge Orders     None        Valarie Merino, MD 02/08/21 1534

## 2021-02-08 NOTE — ED Provider Notes (Signed)
Emergency Medicine Provider Triage Evaluation Note  Carolyn Cole , a 30 y.o. female  was evaluated in triage.  Pt complains of head pain, neck stiffness, elevated BP, dizziness for the last two days. Vaginal delivery 10 days ago. No hx PreE, E, HTN, gestation diabetes in the pregnancy. No chest pain, SOB, seizures. Some chills.  Review of Systems  Positive: As above Negative: As above  Physical Exam  BP (!) 175/97 (BP Location: Left Arm)   Pulse 69   Temp 99.2 F (37.3 C) (Oral)   Resp 18   SpO2 98%  Gen:   Awake, no distress   Resp:  Normal effort  MSK:   Moves extremities without difficulty  Other:  Cn3-12 grossly intact, intact strength bil UE/LE  Medical Decision Making  Medically screening exam initiated at 11:43 AM.  Appropriate orders placed.  [name] was informed that the remainder of the evaluation will be completed by another provider, this initial triage assessment does not replace that evaluation, and the importance of remaining in the ED until their evaluation is complete.  Headache, elevated BP, dizziness postpartum   Carolyn Cole 02/08/21 1145    Tegeler, Carolyn Allegra, MD 02/08/21 2027

## 2021-02-08 NOTE — ED Triage Notes (Signed)
Vaginal delivery 10 days ago.  Reports headache, neck pain, dizziness, and hypertension.  Denies previous history of htn.  Seen by OB yesterday and states BP elevated and they rechecked it and it was normal.

## 2021-02-08 NOTE — H&P (Signed)
Carolyn Cole is an 30 y.o. G1P1001 who is admitted for postpartum preeclampsia with severe features. She is s/p SVD on 01/29/2021 (induction of labor for grade 3/very calcified placenta and 6/8 BPP). Pregnancy was otherwise complicated by obesity (BMI 36) and fibroid uterus (largest 20cm on the right).   Her postpartum course was significant for preeclampsia without severe features (ruled in based on mild range Bps during labor and PCR 0.32). She became normotensive postpartum and was asymptomatic. She was not started on any antihypertensives. She was discharged home on 01/31/21.   She was seen for an RN BP check in the office yesterday and her blood pressures were 147/87 and repeat was 127/85. Unfortunately, information regarding some of her symptoms were not relayed as she did not have an office visit. She states that over the last week she has had headaches on and off. States she would wake up with a headache and feeling some chest pressure. She received 1g of Tylenol today while in the ED and this has resolved her headache. She has noted some bilateral LE edema but has not really noticed any of her face.  CBC obtained in the ED is unremarkable, H/H 13.4/40 and platelets are 281. CMP reveals mildly elevated ALT of 47 and AST is 35. Creatinine 0.72.  She is currently breastfeeding and desires a breast pump to bedside. Significant other and infant are at home.  Patient Active Problem List   Diagnosis Date Noted   Preeclampsia in postpartum period 02/08/2021   Encounter for induction of labor 01/28/2021   BMI 36.0-36.9,adult 01/27/2021   Fibroids 06/04/2018   Asthma 05/18/2018   Intramural leiomyoma of uterus 05/18/2018   Personal history of MRSA (methicillin resistant Staphylococcus aureus) 05/18/2018   MEDICAL/FAMILY/SOCIAL HX: No LMP recorded.    Past Medical History:  Diagnosis Date   Anemia    Fibroids    Papilledema, both eyes    Vertigo     Past Surgical History:  Procedure  Laterality Date   APPENDECTOMY      Family History  Problem Relation Age of Onset   Thyroid disease Mother    Hypertension Mother    Hypertension Father    Ulcers Father    Gout Paternal Grandmother    Chiari malformation Sister     Social History:  reports that she has never smoked. She has never used smokeless tobacco. She reports that she does not currently use alcohol. She reports that she does not use drugs.  ALLERGIES/MEDS:  Allergies: No Known Allergies  (Not in a hospital admission)    Review of Systems  Constitutional:  Negative for chills, diaphoresis, fever, malaise/fatigue and weight loss.  HENT:  Negative for congestion, ear pain, hearing loss, nosebleeds, sinus pain, sore throat and tinnitus.   Eyes:  Negative for blurred vision, double vision, photophobia, pain, discharge and redness.  Respiratory:  Negative for cough, hemoptysis, sputum production, shortness of breath, wheezing and stridor.   Cardiovascular:  Positive for chest pain and leg swelling. Negative for palpitations, orthopnea, claudication and PND.  Gastrointestinal:  Negative for abdominal pain, blood in stool, constipation, diarrhea, heartburn, melena, nausea and vomiting.  Genitourinary:  Negative for dysuria, flank pain, frequency, hematuria and urgency.  Musculoskeletal:  Negative for back pain, falls, joint pain, myalgias and neck pain.  Skin:  Negative for itching and rash.  Neurological:  Positive for headaches. Negative for dizziness, tingling, tremors, sensory change, speech change, focal weakness, seizures, loss of consciousness and weakness.  Endo/Heme/Allergies:  Negative for  environmental allergies and polydipsia. Does not bruise/bleed easily.  Psychiatric/Behavioral:  Negative for depression, hallucinations, memory loss, substance abuse and suicidal ideas. The patient is not nervous/anxious and does not have insomnia.    Blood pressure (!) 157/102, pulse 65, temperature 99.2 F (37.3  C), temperature source Oral, resp. rate (!) 21, SpO2 99 %, unknown if currently breastfeeding. Gen:  NAD, pleasant and cooperative HEENT: Periorbital edema present Cardio:  RRR Pulm:  CTAB, no wheezes/rales/rhonchi Abd:  Soft, non-distended, non-tender throughout, no rebound/guarding Ext:  1+ bilateral LE edema, no bilateral calf tenderness  Results for orders placed or performed during the hospital encounter of 02/08/21 (from the past 24 hour(s))  CBC with Differential     Status: Abnormal   Collection Time: 02/08/21 11:48 AM  Result Value Ref Range   WBC 5.7 4.0 - 10.5 K/uL   RBC 4.08 3.87 - 5.11 MIL/uL   Hemoglobin 13.4 12.0 - 15.0 g/dL   HCT 40.0 36.0 - 46.0 %   MCV 98.0 80.0 - 100.0 fL   MCH 32.8 26.0 - 34.0 pg   MCHC 33.5 30.0 - 36.0 g/dL   RDW 14.8 11.5 - 15.5 %   Platelets 281 150 - 400 K/uL   nRBC 0.0 0.0 - 0.2 %   Neutrophils Relative % 57 %   Neutro Abs 3.3 1.7 - 7.7 K/uL   Lymphocytes Relative 23 %   Lymphs Abs 1.3 0.7 - 4.0 K/uL   Monocytes Relative 11 %   Monocytes Absolute 0.6 0.1 - 1.0 K/uL   Eosinophils Relative 6 %   Eosinophils Absolute 0.3 0.0 - 0.5 K/uL   Basophils Relative 1 %   Basophils Absolute 0.0 0.0 - 0.1 K/uL   Immature Granulocytes 2 %   Abs Immature Granulocytes 0.10 (H) 0.00 - 0.07 K/uL  Comprehensive metabolic panel     Status: Abnormal   Collection Time: 02/08/21 11:48 AM  Result Value Ref Range   Sodium 140 135 - 145 mmol/L   Potassium 3.8 3.5 - 5.1 mmol/L   Chloride 105 98 - 111 mmol/L   CO2 27 22 - 32 mmol/L   Glucose, Bld 92 70 - 99 mg/dL   BUN 5 (L) 6 - 20 mg/dL   Creatinine, Ser 0.72 0.44 - 1.00 mg/dL   Calcium 9.5 8.9 - 10.3 mg/dL   Total Protein 6.4 (L) 6.5 - 8.1 g/dL   Albumin 4.1 3.5 - 5.0 g/dL   AST 35 15 - 41 U/L   ALT 47 (H) 0 - 44 U/L   Alkaline Phosphatase 129 (H) 38 - 126 U/L   Total Bilirubin 0.8 0.3 - 1.2 mg/dL   GFR, Estimated >60 >60 mL/min   Anion gap 8 5 - 15    No results  found.   ASSESSMENT/PLAN: Carolyn Cole is a 30 y.o. G1P1001 who is admitted for postpartum preeclampsia with severe features.  - [facility] - CBC and CMP as above, repeat for AM ordered along with LDH - Diet:  Regular - IVF:  LR at 75cc/hour - Seizure prophyalxis: Magnesium sulfate 4g bolus followed by 2g/hr - Intake and output: q2 hours - Medication: Procardia 30mg  XL daily ordered - STAT EKG for chest pressure - VTE Prophylaxis:  SCDs - Breast pump to bedside   Drema Dallas, [place] (office)

## 2021-02-08 NOTE — ED Notes (Signed)
Report given to [place] rn

## 2021-02-09 DIAGNOSIS — O1415 Severe pre-eclampsia, complicating the puerperium: Secondary | ICD-10-CM | POA: Diagnosis not present

## 2021-02-09 LAB — CBC WITH DIFFERENTIAL/PLATELET
Abs Immature Granulocytes: 0.08 10*3/uL — ABNORMAL HIGH (ref 0.00–0.07)
Basophils Absolute: 0 10*3/uL (ref 0.0–0.1)
Basophils Relative: 1 %
Eosinophils Absolute: 0.4 10*3/uL (ref 0.0–0.5)
Eosinophils Relative: 7 %
HCT: 39.1 % (ref 36.0–46.0)
Hemoglobin: 13.2 g/dL (ref 12.0–15.0)
Immature Granulocytes: 2 %
Lymphocytes Relative: 24 %
Lymphs Abs: 1.3 10*3/uL (ref 0.7–4.0)
MCH: 32.9 pg (ref 26.0–34.0)
MCHC: 33.8 g/dL (ref 30.0–36.0)
MCV: 97.5 fL (ref 80.0–100.0)
Monocytes Absolute: 0.6 10*3/uL (ref 0.1–1.0)
Monocytes Relative: 11 %
Neutro Abs: 2.9 10*3/uL (ref 1.7–7.7)
Neutrophils Relative %: 55 %
Platelets: 271 10*3/uL (ref 150–400)
RBC: 4.01 MIL/uL (ref 3.87–5.11)
RDW: 15 % (ref 11.5–15.5)
WBC: 5.2 10*3/uL (ref 4.0–10.5)
nRBC: 0 % (ref 0.0–0.2)

## 2021-02-09 LAB — COMPREHENSIVE METABOLIC PANEL
ALT: 43 U/L (ref 0–44)
AST: 33 U/L (ref 15–41)
Albumin: 3.7 g/dL (ref 3.5–5.0)
Alkaline Phosphatase: 105 U/L (ref 38–126)
Anion gap: 10 (ref 5–15)
BUN: 6 mg/dL (ref 6–20)
CO2: 26 mmol/L (ref 22–32)
Calcium: 8.2 mg/dL — ABNORMAL LOW (ref 8.9–10.3)
Chloride: 103 mmol/L (ref 98–111)
Creatinine, Ser: 0.72 mg/dL (ref 0.44–1.00)
GFR, Estimated: >60 mL/min (ref >60)
Glucose, Bld: 100 mg/dL — ABNORMAL HIGH (ref 70–99)
Potassium: 3.9 mmol/L (ref 3.5–5.1)
Sodium: 139 mmol/L (ref 135–145)
Total Bilirubin: 0.8 mg/dL (ref 0.3–1.2)
Total Protein: 5.8 g/dL — ABNORMAL LOW (ref 6.5–8.1)

## 2021-02-09 LAB — LACTATE DEHYDROGENASE: LDH: 320 U/L — ABNORMAL HIGH (ref 98–192)

## 2021-02-09 MED ORDER — NIFEDIPINE ER 60 MG PO TB24: 60.0000 mg | ORAL_TABLET | Freq: Every day | ORAL | 3 refills | Status: DC

## 2021-02-09 MED ORDER — NIFEDIPINE ER OSMOTIC RELEASE 30 MG PO TB24
30.0000 mg | ORAL_TABLET | Freq: Every day | ORAL | Status: AC
  Administered 2021-02-09: 30 mg via ORAL
  Filled 2021-02-09: qty 1

## 2021-02-09 MED ORDER — NIFEDIPINE ER OSMOTIC RELEASE 60 MG PO TB24: 60.0000 mg | ORAL_TABLET | Freq: Every day | ORAL | Status: DC

## 2021-02-09 NOTE — Discharge Summary (Signed)
Physician Discharge Summary  Patient ID: Carolyn Cole MRN: [id number] DOB/AGE: 29/19/1992 29 y.o.  Admit date: 02/08/2021 Discharge date: 02/09/2021  Admission Diagnoses: Postpartum preeclampsia with severe features  Discharge Diagnoses:  Active Problems:   Preeclampsia in postpartum period   Discharged Condition: good  Hospital Course: Patient was admitted on 02/08/21 for postpartum preeclampsia with severe features. She is s/p SVD on 01/29/2021 (induction of labor for grade 3/very calcified placenta and 6/8 BPP). Pregnancy was otherwise complicated by obesity (BMI 36) and fibroid uterus (largest 20cm on the right).    Her postpartum course was significant for preeclampsia without severe features (ruled in based on mild range Bps during labor and PCR 0.32). She became normotensive postpartum and was asymptomatic. She was not started on any antihypertensives. She was discharged home on 01/31/21.    She was seen for an RN BP check in the office on 02/07/21 and her blood pressures were 147/87 and repeat was 127/85. Unfortunately, information regarding some of her symptoms were not relayed as she did not have an office visit. She states that over the last week she has had headaches on and off. States she would wake up with a headache and feeling some chest pressure. She received 1g of Tylenol on 02/08/21 while in the ED and this has resolved her headache. She has noted some bilateral LE edema but has not really noticed any of her face.   CBC obtained in the ED was unremarkable, H/H 13.4/40 and platelets are 281. CMP revealed mildly elevated ALT of 47 and AST is 35. Creatinine 0.72. Prior to discharge, AST/ALT were within normal limits (33/43). While in the ED, she had severe range blood pressures as high as 175/97.  She completed 24 hours of Magnesium sulfate and started on Procardia 60mg  daily. Her blood pressures prior to discharge were normal range with some blood pressures in the  140s/80s.  Consults: None  Significant Diagnostic Studies: labs:  CBC Latest Ref Rng & Units 02/09/2021 02/08/2021 01/30/2021  WBC 4.0 - 10.5 K/uL 5.2 5.7 10.9(H)  Hemoglobin 12.0 - 15.0 g/dL 13.2 13.4 9.7(L)  Hematocrit 36.0 - 46.0 % 39.1 40.0 29.0(L)  Platelets 150 - 400 K/uL 271 281 176   CMP Latest Ref Rng & Units 02/09/2021 02/08/2021 01/29/2021  Glucose 70 - 99 mg/dL 100(H) 92 102(H)  BUN 6 - 20 mg/dL 6 5(L) <5(L)  Creatinine 0.44 - 1.00 mg/dL 0.72 0.72 0.64  Sodium 135 - 145 mmol/L 139 140 134(L)  Potassium 3.5 - 5.1 mmol/L 3.9 3.8 3.7  Chloride 98 - 111 mmol/L 103 105 103  CO2 22 - 32 mmol/L 26 27 24   Calcium 8.9 - 10.3 mg/dL 8.2(L) 9.5 9.0  Total Protein 6.5 - 8.1 g/dL 5.8(L) 6.4(L) 5.7(L)  Total Bilirubin 0.3 - 1.2 mg/dL 0.8 0.8 0.6  Alkaline Phos 38 - 126 U/L 105 129(H) 190(H)  AST 15 - 41 U/L 33 35 19  ALT 0 - 44 U/L 43 47(H) 12   Recent Results (from the past 2160 hour(s))  OB RESULT CONSOLE Group B Strep     Status: None   Collection Time: 12/23/20 12:00 AM  Result Value Ref Range   GBS Negative   OB RESULTS CONSOLE GC/Chlamydia     Status: None   Collection Time: 12/23/20 12:00 AM  Result Value Ref Range   Gonorrhea Negative    Chlamydia Negative   CBC     Status: Abnormal   Collection Time: 01/28/21  5:00 PM  Result Value  Ref Range   WBC 7.1 4.0 - 10.5 K/uL   RBC 3.77 (L) 3.87 - 5.11 MIL/uL   Hemoglobin 12.1 12.0 - 15.0 g/dL   HCT 36.2 36.0 - 46.0 %   MCV 96.0 80.0 - 100.0 fL   MCH 32.1 26.0 - 34.0 pg   MCHC 33.4 30.0 - 36.0 g/dL   RDW 14.0 11.5 - 15.5 %   Platelets 229 150 - 400 K/uL   nRBC 0.0 0.0 - 0.2 %    Comment: Performed at [facility], [place] [address], [place], [place] [address]  Type and screen [place]     Status: None   Collection Time: 01/28/21  5:00 PM  Result Value Ref Range   ABO/RH(D) O POS    Antibody Screen NEG    Sample Expiration      01/31/2021,2359 Performed at [facility], [place]  [address], [place], [place] [address]   RPR     Status: None   Collection Time: 01/28/21  5:00 PM  Result Value Ref Range   RPR Ser Ql NON REACTIVE NON REACTIVE    Comment: Performed at [facility], [place] [address], [place], [place] [address]  Resp Panel by RT-PCR (Flu A&B, Covid) Nasopharyngeal Swab     Status: None   Collection Time: 01/28/21  5:00 PM   Specimen: Nasopharyngeal Swab; Nasopharyngeal(NP) swabs in vial transport medium  Result Value Ref Range   SARS Coronavirus 2 by RT PCR NEGATIVE NEGATIVE    Comment: (NOTE) SARS-CoV-2 target nucleic acids are NOT DETECTED.  The SARS-CoV-2 RNA is generally detectable in upper respiratory specimens during the acute phase of infection. The lowest concentration of SARS-CoV-2 viral copies this assay can detect is 138 copies/mL. A negative result does not preclude SARS-Cov-2 infection and should not be used as the sole basis for treatment or other patient management decisions. A negative result may occur with  improper specimen collection/handling, submission of specimen other than nasopharyngeal swab, presence of viral mutation(s) within the areas targeted by this assay, and inadequate number of viral copies(<138 copies/mL). A negative result must be combined with clinical observations, patient history, and epidemiological information. The expected result is Negative.  Fact Sheet for Patients:  EntrepreneurPulse.com.au  Fact Sheet for Healthcare Providers:  IncredibleEmployment.be  This test is no t yet approved or cleared by the [place] FDA and  has been authorized for detection and/or diagnosis of SARS-CoV-2 by FDA under an Emergency Use Authorization (EUA). This EUA will remain  in effect (meaning this test can be used) for the duration of the COVID-19 declaration under Section 564(b)(1) of the Act, 21 U.S.C.section 360bbb-3(b)(1), unless the authorization is terminated  or revoked sooner.        Influenza A by PCR NEGATIVE NEGATIVE   Influenza B by PCR NEGATIVE NEGATIVE    Comment: (NOTE) The Xpert Xpress SARS-CoV-2/FLU/RSV plus assay is intended as an aid in the diagnosis of influenza from Nasopharyngeal swab specimens and should not be used as a sole basis for treatment. Nasal washings and aspirates are unacceptable for Xpert Xpress SARS-CoV-2/FLU/RSV testing.  Fact Sheet for Patients: EntrepreneurPulse.com.au  Fact Sheet for Healthcare Providers: IncredibleEmployment.be  This test is not yet approved or cleared by the [place] FDA and has been authorized for detection and/or diagnosis of SARS-CoV-2 by FDA under an Emergency Use Authorization (EUA). This EUA will remain in effect (meaning this test can be used) for the duration of the COVID-19  declaration under Section 564(b)(1) of the Act, 21 U.S.C. section 360bbb-3(b)(1), unless the authorization is terminated or revoked.  Performed at [facility], [place] [address] [place], [place] [address]   Comprehensive metabolic panel     Status: Abnormal   Collection Time: 01/29/21  6:40 AM  Result Value Ref Range   Sodium 134 (L) 135 - 145 mmol/L   Potassium 3.7 3.5 - 5.1 mmol/L   Chloride 103 98 - 111 mmol/L   CO2 24 22 - 32 mmol/L   Glucose, Bld 102 (H) 70 - 99 mg/dL    Comment: Glucose reference range applies only to samples taken after fasting for at least 8 hours.   BUN <5 (L) 6 - 20 mg/dL   Creatinine, Ser 0.64 0.44 - 1.00 mg/dL   Calcium 9.0 8.9 - 10.3 mg/dL   Total Protein 5.7 (L) 6.5 - 8.1 g/dL   Albumin 3.1 (L) 3.5 - 5.0 g/dL   AST 19 15 - 41 U/L   ALT 12 0 - 44 U/L   Alkaline Phosphatase 190 (H) 38 - 126 U/L   Total Bilirubin 0.6 0.3 - 1.2 mg/dL   GFR, Estimated >60 >60 mL/min    Comment: (NOTE) Calculated using the CKD-EPI Creatinine Equation (2021)    Anion gap 7 5 - 15    Comment: Performed at [facility], [place] [address] [place],  [place] [address]  CBC     Status: Abnormal   Collection Time: 01/29/21  6:40 AM  Result Value Ref Range   WBC 7.5 4.0 - 10.5 K/uL   RBC 3.71 (L) 3.87 - 5.11 MIL/uL   Hemoglobin 11.8 (L) 12.0 - 15.0 g/dL   HCT 35.7 (L) 36.0 - 46.0 %   MCV 96.2 80.0 - 100.0 fL   MCH 31.8 26.0 - 34.0 pg   MCHC 33.1 30.0 - 36.0 g/dL   RDW 14.2 11.5 - 15.5 %   Platelets 213 150 - 400 K/uL   nRBC 0.0 0.0 - 0.2 %    Comment: Performed at [facility], [place] [address] [place], [place] [address]  Protein / creatinine ratio, urine     Status: Abnormal   Collection Time: 01/29/21  8:34 AM  Result Value Ref Range   Creatinine, Urine 85.46 mg/dL   Total Protein, Urine 27 mg/dL    Comment: NO NORMAL RANGE ESTABLISHED FOR THIS TEST   Protein Creatinine Ratio 0.32 (H) 0.00 - 0.15 mg/mg[Cre]    Comment: Performed at [facility] [address], [place], [place] [address]  Surgical pathology     Status: None   Collection Time: 01/29/21  1:34 PM  Result Value Ref Range   SURGICAL PATHOLOGY      SURGICAL PATHOLOGY CASE: [id number] PATIENT: Savanah Cavell Surgical Pathology Report     Clinical History: BPP 6/8 calcified placenta     FINAL MICROSCOPIC DIAGNOSIS:  A. PLACENTA, [redacted]W[redacted]D:  - Placenta, 648 g. - Mild meconium. - Subchorionic thrombus. - Three-vessel umbilical cord.   GROSS DESCRIPTION:  Specimen received: Singleton placenta, received fresh Size and shape: 18 x 17 x 3 cm, discoid Weight: 648 g excluding the cord and membranes Umbilical cord: Eccentrically inserted, 35 cm in length, uniform diameter and characteristic trivasculature Membranes: Marginally inserted, smooth, translucent and tan-pink with pale green discoloration Fetal surface: Smooth, gray-blue with pale green discoloration Maternal surface: Spongy red-brown with yellow areas of calcification Cut surface: Unremarkable Block summary: 4 blocks submitted (GRP 01/30/2021)  Final Diagnosis performed by Claudette Laws, MD.   Electronically signed 01/31/2021 Technical an d / or Professional components performed at [facility], [place] [address], [place], [place] [address].  Immunohistochemistry Technical component (if applicable) was performed at [facility]. [address], [place], [place], [place] [address].   IMMUNOHISTOCHEMISTRY DISCLAIMER (if applicable): Some of these immunohistochemical stains may have been developed and the performance characteristics determine by [facility]. Some may not have been cleared or approved by the U.S. Food and Drug Administration. The FDA has determined that such clearance or approval is not necessary. This test is used for clinical purposes. It should not be regarded as investigational or for research. This laboratory is certified under the [place] (CLIA-88) as qualified to perform high complexity clinical laboratory testing.  The controls stained appropriately.   CBC     Status: Abnormal   Collection Time: 01/30/21  4:59 AM  Result Value Ref Range   WBC 10.9 (H) 4.0 - 10.5 K/uL   RBC 2.98 (L) 3.87 - 5.11 MIL/uL   Hemoglobin 9.7 (L) 12.0 - 15.0 g/dL   HCT 29.0 (L) 36.0 - 46.0 %   MCV 97.3 80.0 - 100.0 fL   MCH 32.6 26.0 - 34.0 pg   MCHC 33.4 30.0 - 36.0 g/dL   RDW 14.3 11.5 - 15.5 %   Platelets 176 150 - 400 K/uL   nRBC 0.0 0.0 - 0.2 %    Comment: Performed at [facility], [place] [address] [place], [place] [address]  Lactate dehydrogenase     Status: Abnormal   Collection Time: 01/30/21  4:59 AM  Result Value Ref Range   LDH 228 (H) 98 - 192 U/L    Comment: Performed at [facility], [place] [address], [place], [place] [address]  CBC with Differential     Status: Abnormal   Collection Time: 02/08/21 11:48 AM  Result Value Ref Range   WBC 5.7 4.0 - 10.5 K/uL   RBC 4.08 3.87 - 5.11 MIL/uL   Hemoglobin 13.4 12.0 - 15.0 g/dL   HCT 40.0 36.0 - 46.0  %   MCV 98.0 80.0 - 100.0 fL   MCH 32.8 26.0 - 34.0 pg   MCHC 33.5 30.0 - 36.0 g/dL   RDW 14.8 11.5 - 15.5 %   Platelets 281 150 - 400 K/uL   nRBC 0.0 0.0 - 0.2 %   Neutrophils Relative % 57 %   Neutro Abs 3.3 1.7 - 7.7 K/uL   Lymphocytes Relative 23 %   Lymphs Abs 1.3 0.7 - 4.0 K/uL   Monocytes Relative 11 %   Monocytes Absolute 0.6 0.1 - 1.0 K/uL   Eosinophils Relative 6 %   Eosinophils Absolute 0.3 0.0 - 0.5 K/uL   Basophils Relative 1 %   Basophils Absolute 0.0 0.0 - 0.1 K/uL   Immature Granulocytes 2 %   Abs Immature Granulocytes 0.10 (H) 0.00 - 0.07 K/uL    Comment: Performed at [facility] [address] [place], [place] [address]  Comprehensive metabolic panel     Status: Abnormal   Collection Time: 02/08/21 11:48 AM  Result Value Ref Range   Sodium 140 135 - 145 mmol/L   Potassium 3.8 3.5 - 5.1 mmol/L   Chloride 105 98 - 111 mmol/L   CO2 27 22 - 32 mmol/L   Glucose, Bld 92 70 - 99 mg/dL    Comment:  Glucose reference range applies only to samples taken after fasting for at least 8 hours.   BUN 5 (L) 6 - 20 mg/dL   Creatinine, Ser 0.72 0.44 - 1.00 mg/dL   Calcium 9.5 8.9 - 10.3 mg/dL   Total Protein 6.4 (L) 6.5 - 8.1 g/dL   Albumin 4.1 3.5 - 5.0 g/dL   AST 35 15 - 41 U/L   ALT 47 (H) 0 - 44 U/L   Alkaline Phosphatase 129 (H) 38 - 126 U/L   Total Bilirubin 0.8 0.3 - 1.2 mg/dL   GFR, Estimated >60 >60 mL/min    Comment: (NOTE) Calculated using the CKD-EPI Creatinine Equation (2021)    Anion gap 8 5 - 15    Comment: Performed at [facility], [place] [address], [place], [place] [address]  Resp Panel by RT-PCR (Flu A&B, Covid) Nasopharyngeal Swab     Status: None   Collection Time: 02/08/21  5:55 PM   Specimen: Nasopharyngeal Swab; Nasopharyngeal(NP) swabs in vial transport medium  Result Value Ref Range   SARS Coronavirus 2 by RT PCR NEGATIVE NEGATIVE    Comment: (NOTE) SARS-CoV-2 target nucleic acids are NOT DETECTED.  The SARS-CoV-2 RNA is  generally detectable in upper respiratory specimens during the acute phase of infection. The lowest concentration of SARS-CoV-2 viral copies this assay can detect is 138 copies/mL. A negative result does not preclude SARS-Cov-2 infection and should not be used as the sole basis for treatment or other patient management decisions. A negative result may occur with  improper specimen collection/handling, submission of specimen other than nasopharyngeal swab, presence of viral mutation(s) within the areas targeted by this assay, and inadequate number of viral copies(<138 copies/mL). A negative result must be combined with clinical observations, patient history, and epidemiological information. The expected result is Negative.  Fact Sheet for Patients:  EntrepreneurPulse.com.au  Fact Sheet for Healthcare Providers:  IncredibleEmployment.be  This test is no t yet approved or cleared by the [place] FDA and  has been authorized for detection and/or diagnosis of SARS-CoV-2 by FDA under an Emergency Use Authorization (EUA). This EUA will remain  in effect (meaning this test can be used) for the duration of the COVID-19 declaration under Section 564(b)(1) of the Act, 21 U.S.C.section 360bbb-3(b)(1), unless the authorization is terminated  or revoked sooner.       Influenza A by PCR NEGATIVE NEGATIVE   Influenza B by PCR NEGATIVE NEGATIVE    Comment: (NOTE) The Xpert Xpress SARS-CoV-2/FLU/RSV plus assay is intended as an aid in the diagnosis of influenza from Nasopharyngeal swab specimens and should not be used as a sole basis for treatment. Nasal washings and aspirates are unacceptable for Xpert Xpress SARS-CoV-2/FLU/RSV testing.  Fact Sheet for Patients: EntrepreneurPulse.com.au  Fact Sheet for Healthcare Providers: IncredibleEmployment.be  This test is not yet approved or cleared by the [place] FDA  and has been authorized for detection and/or diagnosis of SARS-CoV-2 by FDA under an Emergency Use Authorization (EUA). This EUA will remain in effect (meaning this test can be used) for the duration of the COVID-19 declaration under Section 564(b)(1) of the Act, 21 U.S.C. section 360bbb-3(b)(1), unless the authorization is terminated or revoked.  Performed at [facility], [place] [address], [place], [place] [address]   CBC with Differential/Platelet     Status: Abnormal   Collection Time: 02/09/21  5:06 AM  Result Value Ref Range   WBC 5.2 4.0 - 10.5 K/uL   RBC 4.01 3.87 -  5.11 MIL/uL   Hemoglobin 13.2 12.0 - 15.0 g/dL   HCT 39.1 36.0 - 46.0 %   MCV 97.5 80.0 - 100.0 fL   MCH 32.9 26.0 - 34.0 pg   MCHC 33.8 30.0 - 36.0 g/dL   RDW 15.0 11.5 - 15.5 %   Platelets 271 150 - 400 K/uL   nRBC 0.0 0.0 - 0.2 %   Neutrophils Relative % 55 %   Neutro Abs 2.9 1.7 - 7.7 K/uL   Lymphocytes Relative 24 %   Lymphs Abs 1.3 0.7 - 4.0 K/uL   Monocytes Relative 11 %   Monocytes Absolute 0.6 0.1 - 1.0 K/uL   Eosinophils Relative 7 %   Eosinophils Absolute 0.4 0.0 - 0.5 K/uL   Basophils Relative 1 %   Basophils Absolute 0.0 0.0 - 0.1 K/uL   Immature Granulocytes 2 %   Abs Immature Granulocytes 0.08 (H) 0.00 - 0.07 K/uL    Comment: Performed at [facility] [address] [place], [place] [address]  Lactate dehydrogenase     Status: Abnormal   Collection Time: 02/09/21  5:06 AM  Result Value Ref Range   LDH 320 (H) 98 - 192 U/L    Comment: Performed at [facility], [place] [address], [place], [place] [address]  Comprehensive metabolic panel     Status: Abnormal   Collection Time: 02/09/21  5:06 AM  Result Value Ref Range   Sodium 139 135 - 145 mmol/L   Potassium 3.9 3.5 - 5.1 mmol/L   Chloride 103 98 - 111 mmol/L   CO2 26 22 - 32 mmol/L   Glucose, Bld 100 (H) 70 - 99 mg/dL    Comment: Glucose reference range applies only to samples taken after fasting for at least 8 hours.    BUN 6 6 - 20 mg/dL   Creatinine, Ser 0.72 0.44 - 1.00 mg/dL   Calcium 8.2 (L) 8.9 - 10.3 mg/dL   Total Protein 5.8 (L) 6.5 - 8.1 g/dL   Albumin 3.7 3.5 - 5.0 g/dL   AST 33 15 - 41 U/L   ALT 43 0 - 44 U/L   Alkaline Phosphatase 105 38 - 126 U/L   Total Bilirubin 0.8 0.3 - 1.2 mg/dL   GFR, Estimated >60 >60 mL/min    Comment: (NOTE) Calculated using the CKD-EPI Creatinine Equation (2021)    Anion gap 10 5 - 15    Comment: Performed at [facility], [place] [address], [place], [place] [address]     Treatments: Magnesium sulfate for seizure prophylaxis, Procardia 60mg  daily for blood pressure control  Discharge Exam: Blood pressure 121/81, pulse 91, temperature 98.4 F (36.9 C), temperature source Oral, resp. rate 17, height 5\' 3"  (1.6 m), weight 109.8 kg, SpO2 100 %, unknown if currently breastfeeding. Gen: NAD, pleasant and cooperative CV: RRR Resp: CTAB, no wheezes/rales/rhonchi Abdomen: soft, non-distended, non-tender throughout, fibroid uterus palpated Ext: Trace bilateral LE edema, no bilateral calf tenderness, SCDs on and working Neuro: 1+ brachioradialis DTRs on RUE, and 2+ brachioradialis DTRs on LUE, no clonus bilaterally  Disposition: Discharge disposition: 01-Home or Self Care       Allergies as of 02/09/2021   No Known Allergies      Medication List     TAKE these medications    docusate sodium 100 MG capsule Commonly known as: Colace Take 1 capsule (100 mg total) by mouth 2 (two) times daily.   ferrous sulfate 325 (65 FE) MG tablet  Take 1 tablet (325 mg total) by mouth 2 (two) times daily with a meal.   ibuprofen 600 MG tablet Commonly known as: ADVIL Take 1 tablet (600 mg total) by mouth every 6 (six) hours.   NIFEdipine 60 MG 24 hr tablet Commonly known as: ADALAT CC Take 1 tablet (60 mg total) by mouth daily. Start taking on: February 10, 2021   prenatal multivitamin Tabs tablet Take 1 tablet by mouth daily at 12 noon.         Follow-up Information     Drema Dallas, DO Follow up in 2 day(s).   Specialty: Obstetrics and Gynecology Why: Our office will arrange a blood pressure check for you within 2 days of discharge. Contact information: [place] [address] [place] [address] [phone]                 Signed: Drema Dallas 02/09/2021, 6:50 PM

## 2021-02-09 NOTE — Progress Notes (Signed)
Call was made to patient. She feels well. Magnesium stopped at 1724 today. No headache. She feels comfortable with discharge home now. She has the ability to come to the office within 2-3 days for a BP check. Encouraged patient to call with any questions/concerns.  Blood pressures reviewed - last two have been normotensive, 120-130s/80s. Currently on Procardia 60mg  daily which patient will continue at discharge. Message sent to office staff in the office to arrange a BP check in 2-3 days. Patient discharged in stable condition.  Drema Dallas, DO

## 2021-02-09 NOTE — Progress Notes (Signed)
OBGYN Progress Note  S:  Patient feeling well this morning, just tired. Reports mild headache but usually relieved with Tylenol. Significant other was here earlier with her baby Leonides Sake). Denies fevers, chills, chest pain, SOB, or N/V.  O: Temp:  [98 F (36.7 C)-98.6 F (37 C)] 98.6 F (37 C) (10/30 1139) Pulse Rate:  [58-91] 84 (10/30 1139) Resp:  [14-24] 18 (10/30 1139) BP: (124-158)/(77-103) 134/86 (10/30 1139) SpO2:  [97 %-100 %] 100 % (10/30 1139) Weight:  [109.8 kg] 109.8 kg (10/30 0947) Gen: NAD, pleasant and cooperative CV: RRR Resp: CTAB, no wheezes/rales/rhonchi Abdomen: soft, non-distended, non-tender throughout, fibroid uterus palpated Ext: Trace bilateral LE edema, no bilateral calf tenderness, SCDs on and working Neuro: 1+ brachioradialis DTRs on RUE, and 2+ brachioradialis DTRs on LUE, no clonus bilaterally  UOP: ~500cc/hour  Labs:  Recent Labs    02/08/21 1148 02/09/21 0506  HGB 13.4 13.2    A/P: Patient is a 30 y.o. G1P1001 s/p SVD on 01/29/21 readmitted for postpartum preeclampsia with severe features.  - Continue Magnesium sulfate x 24 hours - Medication: Procardia 30mg  XL daily started on 10/29, will add additional dose of 30mg  today for total of 60mg  daily - Preeclampsia labs unremarkable, previous PCR 0.32 - Adequate urine output - Tylenol PRN headache  Disposition:  Possible D/C this evening or in the AM pending BP control   Drema Dallas, DO [phone] (office)

## 2021-02-11 ENCOUNTER — Telehealth (HOSPITAL_COMMUNITY): Payer: Self-pay

## 2021-02-11 NOTE — Telephone Encounter (Signed)
"  I've been having a headache. I had an appointment today with my OB-GYN. My BP was good." RN reviewed signs and symptoms of preeclampsia and when to contact her OB-GYN. "I'm bleeding less now. My swelling has improved in my legs." Patient has no questions or concerns about her healing.  "She's good. Sleeping during the day a lot and up more at night. When she drinks her milk she's out after that. She sleeps in a pack n play or in a bassinet."  RN reviewed ABC's of safe sleep with patient. Patient declines any questions or concerns about baby.  EPDS score is 9.  Sharyn Lull [facility] 02/11/2021,1313

## 2021-02-12 ENCOUNTER — Other Ambulatory Visit: Payer: Self-pay

## 2021-02-12 ENCOUNTER — Ambulatory Visit: Payer: Medicaid Other | Attending: Obstetrics and Gynecology | Admitting: Physical Therapy

## 2021-02-12 VITALS — BP 114/83 | HR 85

## 2021-02-12 DIAGNOSIS — M533 Sacrococcygeal disorders, not elsewhere classified: Secondary | ICD-10-CM | POA: Insufficient documentation

## 2021-02-12 DIAGNOSIS — R2689 Other abnormalities of gait and mobility: Secondary | ICD-10-CM | POA: Diagnosis present

## 2021-02-12 DIAGNOSIS — M62838 Other muscle spasm: Secondary | ICD-10-CM | POA: Diagnosis present

## 2021-02-12 DIAGNOSIS — F4323 Adjustment disorder with mixed anxiety and depressed mood: Secondary | ICD-10-CM | POA: Diagnosis not present

## 2021-02-12 NOTE — Patient Instructions (Signed)
Floor to rise with baby      Baby is parallel to you or under arms, kneeling over toes, untucked Scoop baby under forearms as you squeeze shoulder blades back and down to bring baby to you    Tuck toes under, move knees and body towards ottoman/bench Place baby with your body as close to ottoman as possible  Transition to wide minisquat  Exhale to life baby up    __  Body mechanics training:  Lifting: Squat or ski-track standing (one foot forward, shift weight into back leg as you lift)  Stand at an angle to keep her close to your center    Breastfeeding: Extra pillow under armpit in addition to boppy pillow Carseat in a chair 45 deg to your feeding chair to place her in after feeding so you can rise out of the chair without weight   Stair-climbing:  45 deg turned to rail, step to   Changing table:  Standing with ski track stance 45 deg to table instead of forward bending.   Bending down into stroller: Standing with ski track stance 45 deg to table instead of forward bending.    ___  Breastfeeding with pillows under baby to bring Leonides Sake to you and not slouch  ___  Standing with knees unlocked, more weight on ballmunds and heels not just the heels

## 2021-02-13 NOTE — Therapy (Signed)
[place] MAIN [facility] SERVICES [address] [place], [place], [address] Phone: [phone]   Fax:  [phone]  Physical Therapy Treatment  Patient Details  Name: Carolyn Cole MRN: [id number] Date of Birth: 1990-06-26 Referring Provider (PT): [place], [place],   Encounter Date: 02/12/2021   PT End of Session - 02/12/21 1216     Visit Number 2    Number of Visits 10    Date for PT Re-Evaluation 03/19/21    Authorization Type Medicaid    PT Start Time 1324    PT Stop Time 1304    PT Time Calculation (min) 51 min    Activity Tolerance Patient tolerated treatment well;No increased pain    Behavior During Therapy WFL for tasks assessed/performed             Past Medical History:  Diagnosis Date   Anemia    Fibroids    Papilledema, both eyes    Vertigo     Past Surgical History:  Procedure Laterality Date   APPENDECTOMY      Vitals:   02/12/21 1233  BP: 114/83  Pulse: 85     Subjective Assessment - 02/12/21 1217     Subjective Pt delivered vaginally her first child on 01/29/21 with vaginal tearing 2nd degree. Pt is 2 weeks post partum.  This past week, pt was having bad HAs for days and then on Friday 02/08/21 it was worse and also experienced  neck stiffness, chest pain that day which made her go to Urgent Care and was recommended to the ER. Pt went to ER and was admitted to hospital to monitor BP. Pt was told she had preclampsia. Pt had high BP during L & D. Pt reported after her first session with PT in Sept 28, pelvic and LBP was better by 40%.  Back pain hurts with changing diapers.                [facility] PT Assessment - 02/13/21 1250       Observation/Other Assessments   Scoliosis levelled pelvis and shoulder      Palpation   SI assessment  levelled pelvic girdle and shoulder      Ambulation/Gait   Gait Comments no gait deviations                           [facility] Adult PT  Treatment/Exercise - 02/13/21 1250       Neuro Re-ed    Neuro Re-ed Details  cued for proper body mechanics for baby handling to minimize straining back and pelvic floor and abdominal area                          PT Long Term Goals - 01/09/21 1259       PT LONG TERM GOAL #1   Title PT will demo equal alignment of pelvic floor and shoulder to progress to deep core coordination/ strengthening for pelvic support during last trimester of pregnancy    Baseline L iliac crest higher , L shoulder lower    Time 4    Period Weeks    Status New    Target Date 02/06/21      PT LONG TERM GOAL #2   Title Pt will demo IND with HEP    Time 2    Period Weeks    Status New    Target Date 01/23/21  PT LONG TERM GOAL #3   Title Pt will demo IND with body mechanics  and pelvic floor mm stretches to minmize straining pelvic floor and back and improving pain    Time 8    Period Weeks    Status New    Target Date 03/06/21      PT LONG TERM GOAL #4   Title Pt will demo proper deep core coordination HEP in order to help with  bowel movements and pelvic girdle stability to minimize pubic pain for standing/ getting/out of bed    Baseline straining with bowel movements    Time 6    Period Weeks    Status New    Target Date 02/20/21      PT LONG TERM GOAL #5   Title Pt report daily BM with Type 4  across 25% of the time and no more straining    Baseline : bowel movemetns occur every other day. Type 1-2, Straining 100% of the time (Daily fluid intake 1/2 gal of water).    Time 10    Period Weeks    Status New    Target Date 03/20/21      Additional Long Term Goals   Additional Long Term Goals --      PT LONG TERM GOAL #6   Title Pt's FOTO scores will improve Bowel 52 pt --> > 72 pts, Pain 33 pt --> < 23 pts in order to improve QOL    Time 10    Period Weeks    Status New    Target Date 03/20/21                   Plan - 02/13/21 1249     Clinical  Impression Statement Pt had a vaginal delivery on 01/29/21 with vaginal tearing 2nd degree. Pt was admitted to hospital last week for post-partum preclampsia. Pt's BP has stabilized. Monitored today with normal BP. Pt was provided information about implications of preclampsia on cardiovascular health and provided research articles.     LBP was better by 40%.  Back pain hurts with changing diapers.   Pt was educated to with body mechanics training with changing diapers, breast feeding, and t/f from sit to stand, stand <> floor in order  minimize back pain. Pt demo'd correctly post training and education.  Communicated with pt's referring provider  today during visit to request a new order and clearance for return to PT .   Pt continues to benefit from skilled PT.    Examination-Activity Limitations Sit;Squat;Stand;Toileting    Stability/Clinical Decision Making Evolving/Moderate complexity    Rehab Potential Good    PT Frequency 1x / week    PT Duration Other (comment)   10   PT Treatment/Interventions Moist Heat;Therapeutic activities;Functional mobility training;Stair training;Gait training;Neuromuscular re-education;Balance training;Patient/family education;Therapeutic exercise;Cryotherapy;Passive range of motion;Manual techniques;Scar mobilization;Manual lymph drainage;Taping;Splinting;Energy conservation;Joint Manipulations    Consulted and Agree with Plan of Care Patient             Patient will benefit from skilled therapeutic intervention in order to improve the following deficits and impairments:  Abnormal gait, Improper body mechanics, Pain, Increased muscle spasms, Decreased scar mobility, Decreased coordination, Decreased endurance, Decreased range of motion, Decreased activity tolerance, Hypomobility, Difficulty walking, Postural dysfunction, Decreased balance, Decreased mobility, Decreased strength  Visit Diagnosis: Sacrococcygeal disorders, not elsewhere classified  Other  abnormalities of gait and mobility  Other muscle spasm     Problem List Patient Active Problem List  Diagnosis Date Noted   Preeclampsia in postpartum period 02/08/2021   Encounter for induction of labor 01/28/2021   BMI 36.0-36.9,adult 01/27/2021   Fibroids 06/04/2018   Asthma 05/18/2018   Intramural leiomyoma of uterus 05/18/2018   Personal history of MRSA (methicillin resistant Staphylococcus aureus) 05/18/2018    [name], PT 02/13/2021, 1:01 PM  [place] MAIN [facility] SERVICES [place], [place], [address] Phone: [phone]   Fax:  [phone]  Name: Carolyn Cole MRN: [id number] Date of Birth: 01-11-1991

## 2021-02-19 ENCOUNTER — Ambulatory Visit: Payer: Medicaid Other | Admitting: Physical Therapy

## 2021-02-20 DIAGNOSIS — F4323 Adjustment disorder with mixed anxiety and depressed mood: Secondary | ICD-10-CM | POA: Diagnosis not present

## 2021-03-03 ENCOUNTER — Ambulatory Visit: Payer: Medicaid Other | Admitting: Physical Therapy

## 2021-03-05 ENCOUNTER — Other Ambulatory Visit: Payer: Self-pay

## 2021-03-05 ENCOUNTER — Ambulatory Visit: Payer: Medicaid Other | Admitting: Physical Therapy

## 2021-03-05 DIAGNOSIS — R2689 Other abnormalities of gait and mobility: Secondary | ICD-10-CM

## 2021-03-05 DIAGNOSIS — M533 Sacrococcygeal disorders, not elsewhere classified: Secondary | ICD-10-CM

## 2021-03-05 DIAGNOSIS — M62838 Other muscle spasm: Secondary | ICD-10-CM

## 2021-03-06 NOTE — Patient Instructions (Signed)
Deep core 1-2 ( handout)   Restorative yoga posture ( legs propped)  Guided mindfulness ( body scan)   __  Breastfeeding in sidelying both sides  Breastfeeding with pillow under baby to prevent hunched shoulders

## 2021-03-06 NOTE — Therapy (Signed)
[name] MAIN [facility] SERVICES [address] [place], [place], [address] Phone: [phone]   Fax:  [phone]  Physical Therapy Treatment  Patient Details  Name: Carolyn Cole MRN: [id number] Date of Birth: [birth year] Referring Provider (PT): [place], [place],   Encounter Date: 03/05/2021   PT End of Session - 03/05/21 1212     Visit Number 3    Number of Visits 10    Date for PT Re-Evaluation 03/19/21    Authorization Type Medicaid    PT Start Time 1207    PT Stop Time 1300    PT Time Calculation (min) 53 min    Activity Tolerance Patient tolerated treatment well;No increased pain    Behavior During Therapy WFL for tasks assessed/performed             Past Medical History:  Diagnosis Date   Anemia    Fibroids    Papilledema, both eyes    Vertigo     Past Surgical History:  Procedure Laterality Date   APPENDECTOMY      There were no vitals filed for this visit.   Subjective Assessment - 03/05/21 1210     Subjective Pt has only practiced her shoulder squeezes exericses the past weeks. Pt also tried to stopp locking her knees. LBP has not been as bothering her as much. Pt will bee getting her perineal stitiches out on 03/10/21.                [facility] PT Assessment - 03/06/21 0028       Strength   Overall Strength Comments B hip abd 5/5, BLE 5/5      Palpation   SI assessment  levelled iliac crest , shoulder                        Pelvic Floor Special Questions - 03/06/21 0029     External Perineal Exam through clothing, pelvic floor lengthening , upward movement with breathing/ deep core               [facility] Adult PT Treatment/Exercise - 03/06/21 0028       Neuro Re-ed    Neuro Re-ed Details  cued for deep core coordination, restorative yoga ,body scan mindfulness practice                          PT Long Term Goals - 03/06/21 0034       PT LONG TERM GOAL #1    Title Pt will demo equal alignment of pelvic floor and shoulder to progress to deep core coordination/ strengthening for pelvic support during last trimester of pregnancy    Baseline L iliac crest higher , L shoulder lower    Time 4    Period Weeks    Status On-going      PT LONG TERM GOAL #2   Title Pt will demo IND with HEP    Time 2    Period Weeks    Status On-going      PT LONG TERM GOAL #3   Title Pt will demo IND with body mechanics  and pelvic floor mm stretches to minmize straining pelvic floor and back and improving pain    Time 8    Period Weeks    Status On-going      PT LONG TERM GOAL #4   Title Pt will demo proper deep core coordination HEP in  order to help with  bowel movements and pelvic girdle stability to minimize pubic pain for standing/ getting/out of bed    Baseline straining with bowel movements    Time 6    Period Weeks    Status On-going      PT LONG TERM GOAL #5   Title Pt report daily BM with Type 4  across 25% of the time and no more straining    Baseline : bowel movemetns occur every other day. Type 1-2, Straining 100% of the time (Daily fluid intake 1/2 gal of water).    Time 10    Period Weeks    Status On-going      PT LONG TERM GOAL #6   Title Pt's FOTO scores will improve Bowel 52 pt --> > 72 pts, Pain 33 pt --> < 23 pts in order to improve QOL    Time 10    Period Weeks    Status On-going                   Plan - 03/05/21 1212     Clinical Impression Statement Pt continues to demo good carry over with more equally aligned pelvis and shoulder since [facility] and after delivery of her baby.   Pt was able to progress to deep core coordination with cues . Pt also was educated on relaxation practices and breastfeeding position. Plan to perform external pelvic floor assessment after pt gets her perineal stitches removed on 03/10/21.   Pt continues to benefit from skilled PT.     Examination-Activity Limitations Sit;Squat;Stand;Toileting     Stability/Clinical Decision Making Evolving/Moderate complexity    Rehab Potential Good    PT Frequency 1x / week    PT Duration Other (comment)   10   PT Treatment/Interventions Moist Heat;Therapeutic activities;Functional mobility training;Stair training;Gait training;Neuromuscular re-education;Balance training;Patient/family education;Therapeutic exercise;Cryotherapy;Passive range of motion;Manual techniques;Scar mobilization;Manual lymph drainage;Taping;Splinting;Energy conservation;Joint Manipulations    Consulted and Agree with Plan of Care Patient             Patient will benefit from skilled therapeutic intervention in order to improve the following deficits and impairments:  Abnormal gait, Improper body mechanics, Pain, Increased muscle spasms, Decreased scar mobility, Decreased coordination, Decreased endurance, Decreased range of motion, Decreased activity tolerance, Hypomobility, Difficulty walking, Postural dysfunction, Decreased balance, Decreased mobility, Decreased strength  Visit Diagnosis: Sacrococcygeal disorders, not elsewhere classified  Other abnormalities of gait and mobility  Other muscle spasm     Problem List Patient Active Problem List   Diagnosis Date Noted   Preeclampsia in postpartum period 02/08/2021   Encounter for induction of labor 01/28/2021   BMI 36.0-36.9,adult 01/27/2021   Fibroids 06/04/2018   Asthma 05/18/2018   Intramural leiomyoma of uterus 05/18/2018   Personal history of MRSA (methicillin resistant Staphylococcus aureus) 05/18/2018    [name], PT 03/06/2021, 12:35 AM  [place] MAIN [facility] SERVICES [address] [place], [place], [address] Phone: [phone]   Fax:  [phone]  Name: Carolyn Cole MRN: [id number] Date of Birth: Sep 06, 1990

## 2021-03-10 ENCOUNTER — Encounter: Payer: Medicaid Other | Admitting: Physical Therapy

## 2021-03-12 DIAGNOSIS — F4323 Adjustment disorder with mixed anxiety and depressed mood: Secondary | ICD-10-CM | POA: Diagnosis not present

## 2021-03-17 ENCOUNTER — Other Ambulatory Visit: Payer: Self-pay

## 2021-03-17 ENCOUNTER — Ambulatory Visit: Payer: Medicaid Other | Admitting: Diagnostic Neuroimaging

## 2021-03-17 ENCOUNTER — Encounter: Payer: Medicaid Other | Admitting: Physical Therapy

## 2021-03-17 ENCOUNTER — Encounter: Payer: Self-pay | Admitting: Diagnostic Neuroimaging

## 2021-03-17 VITALS — BP 122/62 | HR 75 | Ht 64.0 in | Wt 221.0 lb

## 2021-03-17 DIAGNOSIS — H4710 Unspecified papilledema: Secondary | ICD-10-CM | POA: Diagnosis not present

## 2021-03-17 DIAGNOSIS — R519 Headache, unspecified: Secondary | ICD-10-CM | POA: Diagnosis not present

## 2021-03-17 DIAGNOSIS — G4489 Other headache syndrome: Secondary | ICD-10-CM | POA: Diagnosis not present

## 2021-03-17 NOTE — Patient Instructions (Signed)
  PAPILLEDEMA / DIFFICULTY FOCUSING / TWITCHING - check MRI brain; then consider LP - follow up Dr. Sharren Bridge (optometry)

## 2021-03-17 NOTE — Progress Notes (Signed)
[facility]  PATIENT: Carolyn Cole DOB: [birth year]  REFERRING CLINICIAN: Minette Brine, FNP HISTORY FROM: patient  REASON FOR VISIT: follow up   HISTORICAL  CHIEF COMPLAINT:  Chief Complaint  Patient presents with   Follow-up    Rm 6 here for 6 month f/u. Pt reports she has not been doing great. In October she had her baby girl and since this her headaches have worsened.     HISTORY OF PRESENT ILLNESS:   UPDATE (03/17/21, VRP): Since last visit, sxs improved, then skipped MRI. Then pregnant, and delivered heathy baby in Oct 2022. Had post-partum preeclampsia sxs. Now better. But also having headaches, blurred vision like in 2021. HA are 4x per week. Positive photophobia; no nausea. Gen pressure and tension.   PRIOR HPI (04/08/20): 30 year old female here for evaluation of papilledema.  For past 1 year patient has had intermittent headaches on the front or side of her head.  Sometimes she has ringing in the ears.  No blurred vision, nausea, sensitive to light or sound.  She was also having some intermittent "dizziness" sensations which she describes as lightheadedness and faint sensation when she stands up or moves too quickly.  No spinning sensation or nausea.  She was also having some problems with visual attention and focusing, especially when shopping at the store or using her phone.  This is not a blurred vision problem but but instead a difficulty processing the information.  She also had some eye twitching and muscle twitching on the left side.    Patient has had weight fluctuation over the past 2 to 3 years ranging from 170 up to 215 pounds.  She is currently at her highest weight.  Patient went to optometrist in October 2021 for evaluation and was found to have bilateral papilledema on dilated retinal exam and OCT testing.  Overall symptoms have improved since Oct 2021.   REVIEW OF SYSTEMS: Full 14 system review of systems performed and negative with  exception of: As per HPI.  ALLERGIES: No Known Allergies  HOME MEDICATIONS: Outpatient Medications Prior to Visit  Medication Sig Dispense Refill   ferrous sulfate 325 (65 FE) MG tablet Take 1 tablet (325 mg total) by mouth 2 (two) times daily with a meal. 60 tablet 3   Prenatal Vit-Fe Fumarate-FA (PRENATAL MULTIVITAMIN) TABS tablet Take 1 tablet by mouth daily at 12 noon.     docusate sodium (COLACE) 100 MG capsule Take 1 capsule (100 mg total) by mouth 2 (two) times daily. (Patient not taking: Reported on 03/17/2021) 60 capsule 3   ibuprofen (ADVIL) 600 MG tablet Take 1 tablet (600 mg total) by mouth every 6 (six) hours. (Patient not taking: Reported on 03/17/2021) 30 tablet 3   NIFEdipine (ADALAT CC) 60 MG 24 hr tablet Take 1 tablet (60 mg total) by mouth daily. (Patient not taking: Reported on 03/17/2021) 30 tablet 3   No facility-administered medications prior to visit.    PAST MEDICAL HISTORY: Past Medical History:  Diagnosis Date   Anemia    Fibroids    Papilledema, both eyes    Vertigo     PAST SURGICAL HISTORY: Past Surgical History:  Procedure Laterality Date   APPENDECTOMY      FAMILY HISTORY: Family History  Problem Relation Age of Onset   Thyroid disease Mother    Hypertension Mother    Hypertension Father    Ulcers Father    Gout Paternal Grandmother    Chiari malformation Sister  SOCIAL HISTORY: Social History   Socioeconomic History   Marital status: Single    Spouse name: Not on file   Number of children: 0   Years of education: Not on file   Highest education level: Master's degree (e.g., MA, MS, MEng, MEd, MSW, MBA)  Occupational History   Not on file  Tobacco Use   Smoking status: Never   Smokeless tobacco: Never  Vaping Use   Vaping Use: Never used  Substance and Sexual Activity   Alcohol use: Not Currently   Drug use: Never   Sexual activity: Not on file  Other Topics Concern   Not on file  Social History Narrative   Caffeine-  green tea   Social Determinants of Health   Financial Resource Strain: Not on file  Food Insecurity: Not on file  Transportation Needs: Not on file  Physical Activity: Not on file  Stress: Not on file  Social Connections: Not on file  Intimate Partner Violence: Not on file     PHYSICAL EXAM  GENERAL EXAM/CONSTITUTIONAL: Vitals:  Vitals:   03/17/21 1518  BP: 122/62  Pulse: 75  SpO2: 97%  Weight: 221 lb (100.2 kg)  Height: 5\' 4"  (1.626 m)   Body mass index is 37.93 kg/m. Wt Readings from Last 3 Encounters:  03/17/21 221 lb (100.2 kg)  02/09/21 242 lb 1 oz (109.8 kg)  01/28/21 242 lb (109.8 kg)   Patient is in no distress; well developed, nourished and groomed; neck is supple  CARDIOVASCULAR: Examination of carotid arteries is normal; no carotid bruits Regular rate and rhythm, no murmurs Examination of peripheral vascular system by observation and palpation is normal  EYES: Ophthalmoscopic exam of optic discs and posterior segments is normal; no papilledema or hemorrhages No results found.  MUSCULOSKELETAL: Gait, strength, tone, movements noted in Neurologic exam below  NEUROLOGIC: MENTAL STATUS:  No flowsheet data found. awake, alert, oriented to person, place and time recent and remote memory intact normal attention and concentration language fluent, comprehension intact, naming intact fund of knowledge appropriate  CRANIAL NERVE:  2nd - BLURRED OPTIC Elim 2nd, 3rd, 4th, 6th - pupils equal and reactive to light, visual fields full to confrontation, extraocular muscles intact, no nystagmus 5th - facial sensation symmetric 7th - facial strength symmetric 8th - hearing intact 9th - palate elevates symmetrically, uvula midline 11th - shoulder shrug symmetric 12th - tongue protrusion midline  MOTOR:  normal bulk and tone, full strength in the BUE, BLE  SENSORY:  normal and symmetric to light touch, temperature, vibration  COORDINATION:   finger-nose-finger, fine finger movements normal  REFLEXES:  deep tendon reflexes present and symmetric  GAIT/STATION:  narrow based gait     DIAGNOSTIC DATA (LABS, IMAGING, TESTING) - I reviewed patient records, labs, notes, testing and imaging myself where available.  Lab Results  Component Value Date   WBC 5.2 02/09/2021   HGB 13.2 02/09/2021   HCT 39.1 02/09/2021   MCV 97.5 02/09/2021   PLT 271 02/09/2021      Component Value Date/Time   NA 139 02/09/2021 0506   K 3.9 02/09/2021 0506   CL 103 02/09/2021 0506   CO2 26 02/09/2021 0506   GLUCOSE 100 (H) 02/09/2021 0506   BUN 6 02/09/2021 0506   CREATININE 0.72 02/09/2021 0506   CALCIUM 8.2 (L) 02/09/2021 0506   PROT 5.8 (L) 02/09/2021 0506   ALBUMIN 3.7 02/09/2021 0506   AST 33 02/09/2021 0506   ALT 43 02/09/2021 0506  ALKPHOS 105 02/09/2021 0506   BILITOT 0.8 02/09/2021 0506   GFRNONAA >60 02/09/2021 0506   No results found for: CHOL, HDL, LDLCALC, LDLDIRECT, TRIG, CHOLHDL No results found for: HGBA1C No results found for: VITAMINB12 Lab Results  Component Value Date   TSH 1.940 11/09/2019     12/22/19 CT head [I reviewed images myself and agree with interpretation. -VRP]  - no acute findings    ASSESSMENT AND PLAN  30 y.o. year old female here with 1 year of intermittent lightheadedness, dizziness, difficulty with visual attention, intermittent ringing in ears, weight gain, and found to have papilledema.  May represent idiopathic intracranial hypertension.  Dx:  1. Papilledema   2. Other headache syndrome   3. Nonintractable headache, unspecified chronicity pattern, unspecified headache type      PLAN:  PAPILLEDEMA / DIFFICULTY FOCUSING / TWITCHING - check MRI brain; then consider LP - follow up Dr. Sharren Bridge (optometry)  Orders Placed This Encounter  Procedures   MR BRAIN W WO CONTRAST    Return in about 6 months (around 09/15/2021).    Penni Bombard, MD 90/05/4095, 3:53  PM Certified in Neurology, Neurophysiology and Neuroimaging  [facility] [address], [place], [place] [address] [phone]

## 2021-03-18 ENCOUNTER — Telehealth: Payer: Self-pay | Admitting: Diagnostic Neuroimaging

## 2021-03-18 NOTE — Telephone Encounter (Signed)
[facility] community auth: [id number] (exp. 03/18/21 to 05/02/21) order sent to GI, they will reach out to the patient to schedule.

## 2021-03-19 DIAGNOSIS — F4323 Adjustment disorder with mixed anxiety and depressed mood: Secondary | ICD-10-CM | POA: Diagnosis not present

## 2021-03-20 ENCOUNTER — Ambulatory Visit: Payer: Medicaid Other | Attending: Obstetrics and Gynecology | Admitting: Physical Therapy

## 2021-03-20 ENCOUNTER — Other Ambulatory Visit: Payer: Self-pay

## 2021-03-20 DIAGNOSIS — M62838 Other muscle spasm: Secondary | ICD-10-CM | POA: Diagnosis not present

## 2021-03-20 DIAGNOSIS — R2689 Other abnormalities of gait and mobility: Secondary | ICD-10-CM | POA: Diagnosis present

## 2021-03-20 DIAGNOSIS — M533 Sacrococcygeal disorders, not elsewhere classified: Secondary | ICD-10-CM | POA: Diagnosis present

## 2021-03-20 NOTE — Patient Instructions (Signed)
OUTPATIENT PHYSICAL THERAPY FEMALE PELVIC Treatment    Patient Name: Carolyn Cole MRN: [id number] DOB:05/28/1990, 30 y.o., female Today's Date: 03/24/2022    PT End of Session - 03/23/22 1322     Visit Number 14    Number of Visits 22    Date for PT Re-Evaluation 05/07/22    PT Start Time 1015    PT Stop Time 1100    PT Time Calculation (min) 45 min    Activity Tolerance Patient tolerated treatment well    Behavior During Therapy WFL for tasks assessed/performed             Past Medical History:  Diagnosis Date   Motion sickness    cars, boats, planes   Wears contact lenses    Past Surgical History:  Procedure Laterality Date   MASS EXCISION N/A 03/19/2017   Procedure: MINOR EXCISION OF MASS  FACIAL .5CM;  Surgeon: McQueen, Chapman, MD;  Location: [facility];  Service: ENT;  Laterality: N/A;   TONSILLECTOMY  2008   Dr. McQueen   There are no problems to display for this patient.     REFERRING PROVIDER: Danielle Wilson, CNM   REFERRING DIAG: Urinary incontinence  THERAPY DIAG:  Sacrococcygeal disorders, not elsewhere classified  Other lack of coordination  Other muscle spasm  Chronic pain of left knee  Other low back pain  Chronic pain of right knee  Chronic pain of both feet  Rationale for Evaluation and Treatment Rehabilitation  ONSET DATE:  03/2016 when her 1st UTI and LBP occurred   SUBJECTIVE:                                                                                                                                                                                           SUBJECTIVE STATEMENT: Pt has no complaints today. Pt is ready for internal pelvic floor assessment today  PAIN:  Are you having pain? No  PRECAUTIONS: None  WEIGHT BEARING RESTRICTIONS No  FALLS:  Has patient fallen in last 6 months? No  LIVING ENVIRONMENT: Lives with: lives with their family Lives in: House/apartment Stairs: No Has  following equipment at home: None  OCCUPATION: consultant and sits all day   PLOF: Independent  PATIENT GOALS   "To not have incontince, UTI and to prepare for conception"  PERTINENT HISTORY:  See above in Subjective    OBJECTIVE:   Muscle strength    ( eval 10/01/21)  RLE 4-/5 hip flexion. Knee flexion, R digit II-IV toe ext  3/5,  R 5/5 DF/ EV, PF  LE  5/5 for hip flexion, knee ext, flexion, DF/   EV, PF                          L hip abd 3+/5, R hip 4/5   ( post Tx: L hip abd 4/5) (10/16/21) B hip abd 4/5  indicates good carry over from last Tx (12/03/21)  PF:  R 1 rep without UE support , L 3 reps MMT 4/5, with UE support R 7 reps, L 12 reps.         (12/08/21)  PF:  R 11 rep without UE support , L 5 reps without UE support        (12/03/21)  R hip abd 3+/5, L 4/5          (12/08/21)  R hip abd 4-/5, L 4+/5    ROM:  ANKLE: Pre Tx: DF AROM OKC B 100 deg / Post Tx: 85 deg B , standing other knee against wall, back ankle 85 deg B CKC/ Post Tx: B 72 deg    (10/16/21)   DF AROM OKC L 68 deg, R 65  deg .   Standing: L 75 deg , R deg ( 10/23/21) , R 75 deg ( 11/06/21)   DF AROM OKC L 60 deg, R 70 deg  Standing :  L 70 deg, R 60 deg, ( 11/19/21)   KNEE Extension ( supine 90-90 deg position)  Pre Tx: R 145 deg, L 140 deg / Post Ts:  R 152 deg, L 145 deg ( 10/16/21)  R 145 deg, L 155 deg ( 10/23/21)  R 147 deg, L 135 deg ( 11/06/21)  B 165 deg (11/19/21)    TOE EXT : limited B , tenderness at the dorsum  ( 10/16/21)   SIJ : R ABD/ ER more limited  ( 10/16/21)  R ABD/ER restored mobility, L limited ( 10/23/21)  L FADDIR mobility limited ( pre Tx)     GAIT: Supination, limited trunk rotation, hip flexion, adducted knees ( eval 10/01/21)                 POSTURE:  forward head and posterior pelvic tilt    ( eval 10/01/21)  Less forward head ( 11/06/21)                                 PELVIC ALIGNMENT:       L shoulder/ L iliac crest lowered in standing and supine  ( eval 10/01/21)  Levelled  pelvis./ shoulders ( 10/16/21)              Palpation:   Increased tightness along L paraspinals, posterior tilt of pelvis, hyperextended knees, adducted knees ( eval 10/01/21)  Increased tightness along L medial/ lateral hamstrings, dorsum / plantar of B feet/ post Tx: improved mobility ( 10/16/21)     Pelvic Floor Special Questions - 03/23/22 1325     Pelvic Floor Internal Exam pt consented verbally without contraindications    Exam Type Vaginal    Palpation tightness at iliococcygeus B, piriformis, ATLA L > R, coccygeus ,  11-2 o'clock   poor feet propioception, when cued for feet stability, pelvic floor reported to be less tender and tight            [facility] Adult PT Treatment/Exercise - 03/23/22 1330       Neuro Re-ed    Neuro Re-ed Details  cued for feet propioception with   pelvic floor relaxation/ lenghtening , cued for pelvic floor stretches      Manual Therapy   Internal Pelvic Floor STM/MWM at areas note din assessment to promote movement of pelvic floor               PATIENT EDUCATION:  Education details: see pt instructions,  Connection of lower kinetic chain to pelvic floor overactivity and how to restore proper alignment and less tightness of feet and knees / SIJ   Person educated: Patient Education method: Explanation, Demonstration, Tactile cues, Verbal cues, and Handouts Education comprehension: verbalized understanding, returned demonstration, verbal cues required, and tactile cues required   HOME EXERCISE PROGRAM: See pt instruction     CLINICAL IMPRESSION:    Addressed pelvic floor mm with internal assessment and Tx today.  Pt demo'd increased mm tensions posterior mm > anterior and required excessive cues for feet propioception to optimize pelvic floor relaxation and lengthening. Plan to continue with internal pelvic floor Tx at next session to achieve proper coordination and mobility with deep core system.   Pt benefits from skilled PT to further  improve Sx /achieve goals.    OBJECTIVE IMPAIRMENTS Abnormal gait, decreased activity tolerance, decreased balance, decreased coordination, decreased endurance, decreased mobility, decreased ROM, decreased strength, decreased safety awareness, dizziness, increased muscle spasms, impaired flexibility, impaired UE functional use, improper body mechanics, and postural dysfunction.   ACTIVITY LIMITATIONS standing, squatting, and stairs  PARTICIPATION LIMITATIONS: community activity    REHAB POTENTIAL: Good  CLINICAL DECISION MAKING: Evolving/moderate complexity  EVALUATION COMPLEXITY: Moderate   GOALS: Goals reviewed with patient? Yes  SHORT TERM GOAL:  Target date:  10/30/2021  Pt will demo IND with HEP  Baseline:  not IND  Goal status: achieved   LONG TERM GOALS: Target date: 05/07/2022      Pt will demo levelled pelvic girdle and shoulder height in order to progress to deep core strengthening HEP  Baseline:   L shoulder/ L iliac crest lowered in standing and supine  Goal status: achieved   2.  Pt will demo less adducted knees and supination in gait to ascend / descend stairs with less pain Baseline:  adducted knees and pain with stairs  Goal status: partially met    ( 12/03/21: pt feels she can squat lower than before and less pain with stairs, slightly adducted knees present)   3. Pt will demo increased toe extension of digit II-IV on R in order to achieve more WBing on transverse arch and less supination to decrease feet pain Baseline: 3/5 MMT  Goal status:partially met   ( 12/03/21: 3+/5 MMT)   4.  Pt will demo > 5 pt change on Lumbar FOTO to improve QOL  Baseline:  94 pts   Goal status: Achieved ( 12/03/21: 83pts)   5.  Pt will demo > 5 pt change on Urinary Problem  FOTO Baseline:  55 pts    Goal status: Achieved ( 12/03/21: 60 pts)   6. Pt will demo increased hip abd to > 4/5 on L in order to achieve pelvic girdle stability, minimize adducted knees/ overactivity  of pelvic floor  Baseline: 3/5 MMT  Goal status: New     PLAN: PT FREQUENCY: 1x/week  PT DURATION: 10 weeks  PLANNED INTERVENTIONS: Therapeutic exercises, Therapeutic activity, Neuromuscular re-education, Balance training, Gait training, Patient/Family education, Joint mobilization, Dry Needling, Spinal mobilization, Moist heat, Taping, and Manual therapy  PLAN FOR NEXT SESSION:  Plan to address pelvic and spinal misalignment at next session.      Bianca Raneri,Shin Yiing, PT 03/24/2022, 8:38 AM 

## 2021-03-20 NOTE — Therapy (Addendum)
[place] MAIN [facility] SERVICES [address] [place], [place], [address] Phone: [phone]   Fax:  [phone]  Physical Therapy Treatment  Patient Details  Name: Carolyn Cole MRN: [id number] Date of Birth: 1990-08-03 Referring Provider (PT): [place], [place],   Encounter Date: 03/20/2021   PT End of Session - 03/20/21 1552     Visit Number 4    Number of Visits 10    Date for PT Re-Evaluation 05/29/2021    Authorization Type Medicaid    PT Start Time 1500    PT Stop Time 1600    PT Time Calculation (min) 60 min    Activity Tolerance Patient tolerated treatment well;No increased pain    Behavior During Therapy WFL for tasks assessed/performed             Past Medical History:  Diagnosis Date   Anemia    Fibroids    Papilledema, both eyes    Vertigo     Past Surgical History:  Procedure Laterality Date   APPENDECTOMY      There were no vitals filed for this visit.   Subjective Assessment - 03/20/21 1506     Subjective Pt is straining slightly with bowel movements.                [facility] PT Assessment - 03/20/21 1552       Palpation   SI assessment  tenderness at R base of SIJ , tightness at coccygeus/ lateral to coccyx R, R lateral leg/ foot tightness                        Pelvic Floor Special Questions - 03/20/21 1554     External Perineal Exam through clothing, pelvic floor tightness on R > L, limited upward movemetn on R               [facility] Adult PT Treatment/Exercise - 03/20/21 1553       Neuro Re-ed    Neuro Re-ed Details  cued for lengethened pelvic floor      Moist Heat Therapy   Number Minutes Moist Heat 5 Minutes    Moist Heat Location --   sacrum     Manual Therapy   Manual therapy comments STM/MWM at problem area noted in assessment to promote nutation of sacrum/ coccyx extension  R                          PT Long Term Goals - 03/19/21 0034        PT LONG TERM GOAL #1   Title Pt will demo equal alignment of pelvic floor and shoulder to progress to deep core coordination/ strengthening for pelvic support during last trimester of pregnancy    Baseline L iliac crest higher , L shoulder lower    Time 4    Period Weeks    Status Achieved     PT LONG TERM GOAL #2   Title Pt will demo IND with HEP    Time 2    Period Weeks    Status On-going      PT LONG TERM GOAL #3   Title Pt will demo IND with body mechanics  and pelvic floor mm stretches to minmize straining pelvic floor and back and improving pain    Time 8    Period Weeks    Status Achieved     PT LONG TERM GOAL #  4   Title Pt will demo proper deep core coordination HEP in order to help with  bowel movements and pelvic girdle stability to minimize pubic pain for standing/ getting/out of bed    Baseline straining with bowel movements    Time 6    Period Weeks    Status Achieved     PT LONG TERM GOAL #5   Title Pt report daily BM with Type 4  across 25% of the time and no more straining    Baseline : bowel movemetns occur every other day. Type 1-2, Straining 100% of the time (Daily fluid intake 1/2 gal of water).    Time 10    Period Weeks    Status On-going      PT LONG TERM GOAL #6   Title Pt's FOTO scores will improve Bowel 52 pt --> > 72 pts, Pain 33 pt --> < 23 pts in order to improve QOL    Time 10    Period Weeks    Status On-going                   Plan - 03/20/21 1552     Clinical Impression Statement  Pt has achieved 3/6 goals and progressing towards remaining goals. Pt's pelvic girdle and spine alignment has been corrected. Pt has started with deep core strengthening which helps with pelvic girdle stability. Focused on decreasing pelvic floor tightness today.  Pt demo'd more lengthened R pelvic floor following Tx today with external techniques to promote better bowel movements and pelvic floor function. Pt will be ready for pelvic floor  strengthening at next session. Pt was cued for pelvic floor stretches. Pt continues to benefit from skilled PT.    Examination-Activity Limitations Sit;Squat;Stand;Toileting    Stability/Clinical Decision Making Evolving/Moderate complexity    Rehab Potential Good    PT Frequency 1x / week    PT Duration Other (comment)   10   PT Treatment/Interventions Moist Heat;Therapeutic activities;Functional mobility training;Stair training;Gait training;Neuromuscular re-education;Balance training;Patient/family education;Therapeutic exercise;Cryotherapy;Passive range of motion;Manual techniques;Scar mobilization;Manual lymph drainage;Taping;Splinting;Energy conservation;Joint Manipulations    Consulted and Agree with Plan of Care Patient             Patient will benefit from skilled therapeutic intervention in order to improve the following deficits and impairments:  Abnormal gait, Improper body mechanics, Pain, Increased muscle spasms, Decreased scar mobility, Decreased coordination, Decreased endurance, Decreased range of motion, Decreased activity tolerance, Hypomobility, Difficulty walking, Postural dysfunction, Decreased balance, Decreased mobility, Decreased strength  Visit Diagnosis: Other abnormalities of gait and mobility  Sacrococcygeal disorders, not elsewhere classified  Other muscle spasm     Problem List Patient Active Problem List   Diagnosis Date Noted   Preeclampsia in postpartum period 02/08/2021   Encounter for induction of labor 01/28/2021   BMI 36.0-36.9,adult 01/27/2021   Fibroids 06/04/2018   Asthma 05/18/2018   Intramural leiomyoma of uterus 05/18/2018   Personal history of MRSA (methicillin resistant Staphylococcus aureus) 05/18/2018    [name], PT 03/20/2021, 4:00 PM  [place] MAIN [facility] SERVICES [address] [place], [place], [address] Phone: [phone]   Fax:  [phone]  Name: Carolyn Cole MRN:  [id number] Date of Birth: 1990-08-29

## 2021-03-24 ENCOUNTER — Encounter: Payer: Medicaid Other | Admitting: Physical Therapy

## 2021-03-27 ENCOUNTER — Ambulatory Visit: Payer: Medicaid Other | Admitting: Physical Therapy

## 2021-03-27 ENCOUNTER — Other Ambulatory Visit: Payer: Self-pay

## 2021-03-27 DIAGNOSIS — R2689 Other abnormalities of gait and mobility: Secondary | ICD-10-CM

## 2021-03-27 DIAGNOSIS — M62838 Other muscle spasm: Secondary | ICD-10-CM | POA: Diagnosis not present

## 2021-03-27 DIAGNOSIS — M533 Sacrococcygeal disorders, not elsewhere classified: Secondary | ICD-10-CM

## 2021-03-27 NOTE — Patient Instructions (Signed)
Multifidis twist  Green Band is on doorknob: sit (facing perpendicular) without back against the chair   Twisting trunk without moving the hips and knees Hold band at the level of ribcage, elbows bent,shoulder blades roll back and down like squeezing a pencil under armpit    Exhale twist,.10-15 deg away from door without moving your hips/ knees. Continue to maintain equal weight through legs. Keep knee unlocked.  20 reps each side

## 2021-03-27 NOTE — Addendum Note (Signed)
Addended by: [name] on: 03/27/2021 12:26 PM   Modules accepted: Orders

## 2021-03-28 DIAGNOSIS — F4323 Adjustment disorder with mixed anxiety and depressed mood: Secondary | ICD-10-CM | POA: Diagnosis not present

## 2021-03-28 NOTE — Therapy (Signed)
[place] MAIN [facility] SERVICES [address] [place], [place], [address] Phone: [phone]   Fax:  [phone]  Physical Therapy Treatment  Patient Details  Name: Carolyn Cole MRN: [id number] Date of Birth: 1991/03/08 Referring Provider (PT): [place], [place],   Encounter Date: 03/27/2021   PT End of Session - 03/27/21 1217     Visit Number 5    Number of Visits 10    Date for PT Re-Evaluation 05/29/21    Authorization Type Medicaid    PT Start Time 1210    PT Stop Time 7673    PT Time Calculation (min) 55 min    Activity Tolerance Patient tolerated treatment well;No increased pain    Behavior During Therapy WFL for tasks assessed/performed             Past Medical History:  Diagnosis Date   Anemia    Fibroids    Papilledema, both eyes    Vertigo     Past Surgical History:  Procedure Laterality Date   APPENDECTOMY      There were no vitals filed for this visit.   Subjective Assessment - 03/27/21 1231     Subjective Pt was able to have more bowel movements (1-2x day) and no more straining after last session instead of 1-2 x week and straining everytime. Pt has Type 6 consistency now instead of Type 1.                [facility] PT Assessment - 03/27/21 1233       Coordination   Coordination and Movement Description poor diassociation between trunk/ pelvis                        Pelvic Floor Special Questions - 03/28/21 0947     External Perineal Exam through clothing:  tightness and tenderness at anterior triangle of pelvic floor 1-2  layers               [facility] Adult PT Treatment/Exercise - 03/28/21 0946       Therapeutic Activites    Other Therapeutic Activities explained the  4 roles  pelvic floor functions, nn system impacting sexual function of pelvic floor, female sexual response cycle and anatomy/ physioology      Neuro Re-ed    Neuro Re-ed Details  cued for multifidis twist for  more diassociation awarenss between trunk and pelvis                          PT Long Term Goals - 03/27/21 1220       PT LONG TERM GOAL #1   Title Pt will demo equal alignment of pelvic floor and shoulder to progress to deep core coordination/ strengthening for pelvic support during last trimester of pregnancy    Baseline L iliac crest higher , L shoulder lower    Time 4    Period Weeks    Status Achieved      PT LONG TERM GOAL #2   Title Pt will demo IND with HEP    Time 2    Period Weeks    Status On-going      PT LONG TERM GOAL #3   Title Pt will demo IND with body mechanics  and pelvic floor mm stretches to minmize straining pelvic floor and back and improving pain    Time 8    Period Weeks    Status Achieved  PT LONG TERM GOAL #4   Title Pt will demo proper deep core coordination HEP in order to help with  bowel movements and pelvic girdle stability to minimize pubic pain for standing/ getting/out of bed    Baseline straining with bowel movements    Time 6    Period Weeks    Status Achieved      PT LONG TERM GOAL #5   Title Pt report daily BM with Type 4  across 25% of the time and no more straining    Baseline : bowel movemetns occur every other day. Type 1-2, Straining 100% of the time (Daily fluid intake 1/2 gal of water).    Time 10    Period Weeks    Status On-going      PT LONG TERM GOAL #6   Title Pt's FOTO scores will improve Bowel 52 pt --> > 72 pts, Pain 33 pt --> < 23 pts in order to improve QOL    Time 10    Period Weeks    Status On-going                   Plan - 03/27/21 1232     Clinical Impression Statement Pt has been able to have more bowel movements (1-2x day) and no more straining after last session instead of 1-2 x week and straining everytime. Pt has Type 6 consistency now instead of Type 1. These are indicators that last manual therapy at SIJ and coccyx was helpful.   Today, pt progressed to multidifis twist  and requried cues for diassociation of pelvs/LE and trunk. Prescribed in seated position today with green band to minimize lumbar lordosis and strengthening back.   Provided education on pain science, pelvic floor anatomy/physiology, and nn system on the role of sexual function.      Plan to further address anterior pelvic floor mm tightness at next session.   Pt continues to benefit from skilled PT                   Examination-Activity Limitations Sit;Squat;Stand;Toileting    Stability/Clinical Decision Making Evolving/Moderate complexity    Rehab Potential Good    PT Frequency 1x / week    PT Duration Other (comment)   10   PT Treatment/Interventions Moist Heat;Therapeutic activities;Functional mobility training;Stair training;Gait training;Neuromuscular re-education;Balance training;Patient/family education;Therapeutic exercise;Cryotherapy;Passive range of motion;Manual techniques;Scar mobilization;Manual lymph drainage;Taping;Splinting;Energy conservation;Joint Manipulations    Consulted and Agree with Plan of Care Patient             Patient will benefit from skilled therapeutic intervention in order to improve the following deficits and impairments:  Abnormal gait, Improper body mechanics, Pain, Increased muscle spasms, Decreased scar mobility, Decreased coordination, Decreased endurance, Decreased range of motion, Decreased activity tolerance, Hypomobility, Difficulty walking, Postural dysfunction, Decreased balance, Decreased mobility, Decreased strength  Visit Diagnosis: Sacrococcygeal disorders, not elsewhere classified  Other abnormalities of gait and mobility  Other muscle spasm     Problem List Patient Active Problem List   Diagnosis Date Noted   Preeclampsia in postpartum period 02/08/2021   Encounter for induction of labor 01/28/2021   BMI 36.0-36.9,adult 01/27/2021   Fibroids 06/04/2018   Asthma 05/18/2018   Intramural leiomyoma of uterus 05/18/2018    Personal history of MRSA (methicillin resistant Staphylococcus aureus) 05/18/2018    Carolyn Cole, PT 03/28/2021, 9:48 AM  [place], [place], [address] Phone: [phone]  Fax:  [phone]  Name: Carolyn Cole MRN: [id number] Date of Birth: 02-13-1991

## 2021-03-31 ENCOUNTER — Encounter: Payer: Medicaid Other | Admitting: Physical Therapy

## 2021-04-01 ENCOUNTER — Ambulatory Visit
Admission: RE | Admit: 2021-04-01 | Discharge: 2021-04-01 | Disposition: A | Discharge status: Home / Self Care | Payer: Medicaid Other | Source: Ambulatory Visit | Attending: Diagnostic Neuroimaging | Admitting: Diagnostic Neuroimaging

## 2021-04-01 ENCOUNTER — Ambulatory Visit: Payer: Medicaid Other | Admitting: Physical Therapy

## 2021-04-01 ENCOUNTER — Other Ambulatory Visit: Payer: Self-pay

## 2021-04-01 DIAGNOSIS — R519 Headache, unspecified: Secondary | ICD-10-CM

## 2021-04-01 DIAGNOSIS — H4710 Unspecified papilledema: Secondary | ICD-10-CM

## 2021-04-01 DIAGNOSIS — M62838 Other muscle spasm: Secondary | ICD-10-CM

## 2021-04-01 DIAGNOSIS — M533 Sacrococcygeal disorders, not elsewhere classified: Secondary | ICD-10-CM

## 2021-04-01 DIAGNOSIS — G4489 Other headache syndrome: Secondary | ICD-10-CM

## 2021-04-01 DIAGNOSIS — R2689 Other abnormalities of gait and mobility: Secondary | ICD-10-CM

## 2021-04-01 MED ORDER — GADOBENATE DIMEGLUMINE 529 MG/ML IV SOLN
20.0000 mL | Freq: Once | INTRAVENOUS | Status: AC | PRN
  Administered 2021-04-01: 10:00:00 20 mL via INTRAVENOUS

## 2021-04-01 NOTE — Patient Instructions (Signed)
Stretch for pelvic floor      Mermaid stretch  Rocking while seated on the floor with heels to one side of the hip Heels to one side of the hip  Rock forward towards the knee that is bent , rock beck towards the opposite sitting bones   ___  Happy Baby pose  On back   ____    Ardine Eng pose rocking     ---  Horse riding ( on belly, ) knee off bed, ankle slides

## 2021-04-01 NOTE — Therapy (Signed)
[place] MAIN [facility] SERVICES [address] [place], [place], [address] Phone: [phone]   Fax:  [phone]  Physical Therapy Treatment  Patient Details  Name: Carolyn Cole MRN: [id number] Date of Birth: 10-15-1990 Referring Provider (PT): [place], [place],   Encounter Date: 04/01/2021   PT End of Session - 04/01/21 [place]     Visit Number 6    Number of Visits 10    Date for PT Re-Evaluation 05/29/21    Authorization Type Medicaid    PT Start Time 6967    PT Stop Time 1805    PT Time Calculation (min) 60 min    Activity Tolerance Patient tolerated treatment well;No increased pain    Behavior During Therapy WFL for tasks assessed/performed             Past Medical History:  Diagnosis Date   Anemia    Fibroids    Papilledema, both eyes    Vertigo     Past Surgical History:  Procedure Laterality Date   APPENDECTOMY      There were no vitals filed for this visit.   Subjective Assessment - 04/01/21 1834     Subjective Pt has had a sick infant. Pt has only tried the multifidis HEP once last week. Pt was not able to tolerate gynecological exams with speculum in the past.                            Pelvic Floor Special Questions - 04/01/21 1827     External Perineal Exam through clothing:tightness and tenderness on L pelvic floor anterior mm only today, proper lengthening noted               [facility] Adult PT Treatment/Exercise - 04/01/21 1828       Therapeutic Activites    Other Therapeutic Activities discussed functional pelvic stretches and functiona positions to minmize pelvic pain      Neuro Re-ed    Neuro Re-ed Details  cued for pelvic floor stretches,  stability principles in functional positions,      Manual Therapy   Manual therapy comments STM/MWM at anterior  L pelvic floor mm                          PT Long Term Goals - 03/27/21 1220       PT LONG TERM GOAL #1    Title Pt will demo equal alignment of pelvic floor and shoulder to progress to deep core coordination/ strengthening for pelvic support during last trimester of pregnancy    Baseline L iliac crest higher , L shoulder lower    Time 4    Period Weeks    Status Achieved      PT LONG TERM GOAL #2   Title Pt will demo IND with HEP    Time 2    Period Weeks    Status On-going      PT LONG TERM GOAL #3   Title Pt will demo IND with body mechanics  and pelvic floor mm stretches to minmize straining pelvic floor and back and improving pain    Time 8    Period Weeks    Status Achieved      PT LONG TERM GOAL #4   Title Pt will demo proper deep core coordination HEP in order to help with  bowel movements and pelvic girdle stability to minimize  pubic pain for standing/ getting/out of bed    Baseline straining with bowel movements    Time 6    Period Weeks    Status Achieved      PT LONG TERM GOAL #5   Title Pt report daily BM with Type 4  across 25% of the time and no more straining    Baseline : bowel movemetns occur every other day. Type 1-2, Straining 100% of the time (Daily fluid intake 1/2 gal of water).    Time 10    Period Weeks    Status On-going      PT LONG TERM GOAL #6   Title Pt's FOTO scores will improve Bowel 52 pt --> > 72 pts, Pain 33 pt --> < 23 pts in order to improve QOL    Time 10    Period Weeks    Status On-going                   Plan - 04/01/21 1832     Clinical Impression Statement Pt demo'd decreased pelvic floor lengthening bilaterally after external manual Tx to the L anterior pelvic floor mm. Pt was provided cues for  pelvic floor stretches and functional positions to help lengthen pelvic floor mm. Pt was educated on various sexual positions and relaxation practices with stretches to help with minimizing pelvic pain with sex and with gynecological exams. Educated pt on what to expect with internal pelvic floor assessment Tx and will administer at  upcoming sessions if pt consents and is ready. Anticipate pelvic floor stretches will continue to help pt manage pelvic pain.  Pt continues to benefit from skilled PT.   Examination-Activity Limitations Sit;Squat;Stand;Toileting    Stability/Clinical Decision Making Evolving/Moderate complexity    Rehab Potential Good    PT Frequency 1x / week    PT Duration Other (comment)   10   PT Treatment/Interventions Moist Heat;Therapeutic activities;Functional mobility training;Stair training;Gait training;Neuromuscular re-education;Balance training;Patient/family education;Therapeutic exercise;Cryotherapy;Passive range of motion;Manual techniques;Scar mobilization;Manual lymph drainage;Taping;Splinting;Energy conservation;Joint Manipulations    Consulted and Agree with Plan of Care Patient             Patient will benefit from skilled therapeutic intervention in order to improve the following deficits and impairments:  Abnormal gait, Improper body mechanics, Pain, Increased muscle spasms, Decreased scar mobility, Decreased coordination, Decreased endurance, Decreased range of motion, Decreased activity tolerance, Hypomobility, Difficulty walking, Postural dysfunction, Decreased balance, Decreased mobility, Decreased strength  Visit Diagnosis: Sacrococcygeal disorders, not elsewhere classified  Other abnormalities of gait and mobility  Other muscle spasm     Problem List Patient Active Problem List   Diagnosis Date Noted   Preeclampsia in postpartum period 02/08/2021   Encounter for induction of labor 01/28/2021   BMI 36.0-36.9,adult 01/27/2021   Fibroids 06/04/2018   Asthma 05/18/2018   Intramural leiomyoma of uterus 05/18/2018   Personal history of MRSA (methicillin resistant Staphylococcus aureus) 05/18/2018    [name], PT 04/01/2021, 6:35 PM  [place] MAIN [facility] SERVICES [address] [place], [place], [address] Phone:  [phone]   Fax:  [phone]  Name: Krista Godsil MRN: [id number] Date of Birth: 1990-10-04

## 2021-04-02 ENCOUNTER — Encounter: Payer: Medicaid Other | Admitting: Physical Therapy

## 2021-04-08 ENCOUNTER — Telehealth: Payer: Self-pay | Admitting: *Deleted

## 2021-04-08 NOTE — Telephone Encounter (Signed)
LVM informing patient her MRI brain. Left # for questions.

## 2021-04-09 ENCOUNTER — Ambulatory Visit: Payer: Medicaid Other | Admitting: Physical Therapy

## 2021-04-09 ENCOUNTER — Other Ambulatory Visit: Payer: Self-pay

## 2021-04-09 DIAGNOSIS — R2689 Other abnormalities of gait and mobility: Secondary | ICD-10-CM

## 2021-04-09 DIAGNOSIS — M533 Sacrococcygeal disorders, not elsewhere classified: Secondary | ICD-10-CM

## 2021-04-09 DIAGNOSIS — F4323 Adjustment disorder with mixed anxiety and depressed mood: Secondary | ICD-10-CM | POA: Diagnosis not present

## 2021-04-09 DIAGNOSIS — M62838 Other muscle spasm: Secondary | ICD-10-CM | POA: Diagnosis not present

## 2021-04-10 ENCOUNTER — Encounter: Payer: Medicaid Other | Admitting: Physical Therapy

## 2021-04-10 NOTE — Patient Instructions (Signed)
Adductor stretch R

## 2021-04-10 NOTE — Therapy (Signed)
[place] MAIN [facility] SERVICES [address] [place], [place], [address] Phone: [phone]   Fax:  [phone]  Physical Therapy Treatment  Patient Details  Name: Carolyn Cole MRN: [id number] Date of Birth: 1990-12-24 Referring Provider (PT): [place], [place],   Encounter Date: 04/09/2021   PT End of Session - 04/10/21 1054     Visit Number 7    Number of Visits 10    Date for PT Re-Evaluation 05/29/21    Authorization Type Medicaid    PT Start Time 1316    PT Stop Time 1415    PT Time Calculation (min) 59 min    Activity Tolerance Patient tolerated treatment well;No increased pain    Behavior During Therapy WFL for tasks assessed/performed             Past Medical History:  Diagnosis Date   Anemia    Fibroids    Papilledema, both eyes    Vertigo     Past Surgical History:  Procedure Laterality Date   APPENDECTOMY      There were no vitals filed for this visit.   Subjective Assessment - 04/10/21 1114     Subjective Pt had no increased pain after last session. Pt feels more confident after learning more about pelvic floor last session                            Pelvic Floor Special Questions - 04/10/21 1019     External Perineal Exam without clothing: superficial tightness and tenderness at R deep transverse perineal, obt int,R, pectineus/ adductors distal and proximal attachments    Pelvic Floor Internal Exam pt consented verbally and had no contraindications    Exam Type Vaginal    Palpation tightness / tenderness at 7-10 o'clock , ischiorami / ischial fossa -1st layer,. ( assessment stopped at 1 st layer , followed up external Tx due to tenderness and wincing pain with internal assessment)               [facility] Adult PT Treatment/Exercise - 04/10/21 1058       Neuro Re-ed    Neuro Re-ed Details  cued for adductor stretch on R      Modalities   Modalities Moist Heat      Moist Heat  Therapy   Number Minutes Moist Heat 5 Minutes    Moist Heat Location --   perineum, in butterfly pose, support under knees, unbilled     Manual Therapy   Manual therapy comments STM/MWM at problem areas noted in assessment. Internal pelvic floor Tx was discontinued due to wincing pain , deferred to external Tx which pt tolerated                          PT Long Term Goals - 03/27/21 1220       PT LONG TERM GOAL #1   Title Pt will demo equal alignment of pelvic floor and shoulder to progress to deep core coordination/ strengthening for pelvic support during last trimester of pregnancy    Baseline L iliac crest higher , L shoulder lower    Time 4    Period Weeks    Status Achieved      PT LONG TERM GOAL #2   Title Pt will demo IND with HEP    Time 2    Period Weeks    Status On-going  PT LONG TERM GOAL #3   Title Pt will demo IND with body mechanics  and pelvic floor mm stretches to minmize straining pelvic floor and back and improving pain    Time 8    Period Weeks    Status Achieved      PT LONG TERM GOAL #4   Title Pt will demo proper deep core coordination HEP in order to help with  bowel movements and pelvic girdle stability to minimize pubic pain for standing/ getting/out of bed    Baseline straining with bowel movements    Time 6    Period Weeks    Status Achieved      PT LONG TERM GOAL #5   Title Pt report daily BM with Type 4  across 25% of the time and no more straining    Baseline : bowel movemetns occur every other day. Type 1-2, Straining 100% of the time (Daily fluid intake 1/2 gal of water).    Time 10    Period Weeks    Status On-going      PT LONG TERM GOAL #6   Title Pt's FOTO scores will improve Bowel 52 pt --> > 72 pts, Pain 33 pt --> < 23 pts in order to improve QOL    Time 10    Period Weeks    Status On-going                   Plan - 04/10/21 1054     Clinical Impression Statement Pt demo'd increased pelvic floor  mm tightness and tenderness at R side  more than L with associated tightness at anterior thigh mm.  Deferred to external Tx due to pain at 1st layer of mm during internal assessment. Pt demo'd improved mobility at pelvic floor on R post Tx. Pt tolerated external Tx without complaints with    modifications to lighter pressure and technique. Plan to continue to address tight mm at next session. Pt continues to benefit from skilled PT.    Examination-Activity Limitations Sit;Squat;Stand;Toileting    Stability/Clinical Decision Making Evolving/Moderate complexity    Rehab Potential Good    PT Frequency 1x / week    PT Duration Other (comment)   10   PT Treatment/Interventions Moist Heat;Therapeutic activities;Functional mobility training;Stair training;Gait training;Neuromuscular re-education;Balance training;Patient/family education;Therapeutic exercise;Cryotherapy;Passive range of motion;Manual techniques;Scar mobilization;Manual lymph drainage;Taping;Splinting;Energy conservation;Joint Manipulations    Consulted and Agree with Plan of Care Patient             Patient will benefit from skilled therapeutic intervention in order to improve the following deficits and impairments:  Abnormal gait, Improper body mechanics, Pain, Increased muscle spasms, Decreased scar mobility, Decreased coordination, Decreased endurance, Decreased range of motion, Decreased activity tolerance, Hypomobility, Difficulty walking, Postural dysfunction, Decreased balance, Decreased mobility, Decreased strength  Visit Diagnosis: Other abnormalities of gait and mobility  Sacrococcygeal disorders, not elsewhere classified  Other muscle spasm     Problem List Patient Active Problem List   Diagnosis Date Noted   Preeclampsia in postpartum period 02/08/2021   Encounter for induction of labor 01/28/2021   BMI 36.0-36.9,adult 01/27/2021   Fibroids 06/04/2018   Asthma 05/18/2018   Intramural leiomyoma of uterus  05/18/2018   Personal history of MRSA (methicillin resistant Staphylococcus aureus) 05/18/2018    [name], PT 04/10/2021, 12:23 PM  [place] [address] [place], [place], [address] Phone: [phone]   Fax:  [phone]  Name: Carolyn Cole  MRN: [id number] Date of Birth: Apr 08, 1991

## 2021-04-15 ENCOUNTER — Ambulatory Visit: Payer: Medicaid Other | Admitting: Physical Therapy

## 2021-04-15 DIAGNOSIS — F4323 Adjustment disorder with mixed anxiety and depressed mood: Secondary | ICD-10-CM | POA: Diagnosis not present

## 2021-04-16 ENCOUNTER — Ambulatory Visit: Payer: Medicaid Other | Admitting: Diagnostic Neuroimaging

## 2021-04-21 ENCOUNTER — Other Ambulatory Visit: Payer: Self-pay

## 2021-04-21 ENCOUNTER — Ambulatory Visit: Payer: Medicaid Other | Attending: Obstetrics and Gynecology | Admitting: Physical Therapy

## 2021-04-21 DIAGNOSIS — M62838 Other muscle spasm: Secondary | ICD-10-CM | POA: Diagnosis not present

## 2021-04-21 DIAGNOSIS — R2689 Other abnormalities of gait and mobility: Secondary | ICD-10-CM | POA: Diagnosis present

## 2021-04-21 DIAGNOSIS — M533 Sacrococcygeal disorders, not elsewhere classified: Secondary | ICD-10-CM | POA: Diagnosis present

## 2021-04-21 NOTE — Patient Instructions (Signed)
Feet care :  Self -feet massage   Handshake : fingers between toes, moving ballmounds/toes back and forth several times while other hand anchors at arch. Do the same at the hind/mid foot.  Heel to toes upward to a letter Big Letter T strokes to spread ballmounds and toes, several times, pinch between webs of toes  Run finger tips along top of foot between long bones "comb between the bones"    Wiggle toes and spread them out when relaxing    __   - Yoga sequence on the floor with daughter ( see your videos)  - deep core level 1-2  ( morning/ evening)  -  Stroller exercises   __  Floor to rise with baby      Baby is parallel to you or under arms, kneeling over toes, untucked Scoop baby under forearms as you squeeze shoulder blades back and down to bring baby to you    Tuck toes under, move knees and body towards ottoman/bench Place baby with your body as close to ottoman as possible  Transition to wide minisquat  Exhale to life baby up

## 2021-04-21 NOTE — Therapy (Signed)
[place] MAIN [facility] SERVICES [address] [place], [place], [address] Phone: [phone]   Fax:  [phone]  Physical Therapy Treatment  Patient Details  Name: Carolyn Cole MRN: [id number] Date of Birth: 10/29/1990 Referring Provider (PT): [place], [place],   Encounter Date: 04/21/2021   PT End of Session - 04/21/21 1234     Visit Number 8    Number of Visits 10    Date for PT Re-Evaluation 05/29/21    Authorization Type Medicaid    PT Start Time 1007    PT Stop Time 1100    PT Time Calculation (min) 53 min    Activity Tolerance Patient tolerated treatment well;No increased pain    Behavior During Therapy WFL for tasks assessed/performed             Past Medical History:  Diagnosis Date   Anemia    Fibroids    Papilledema, both eyes    Vertigo     Past Surgical History:  Procedure Laterality Date   APPENDECTOMY      There were no vitals filed for this visit.   Subjective Assessment - 04/21/21 1235     Subjective Pt notices she has had leakage  and discharge when not having done her exercises. Pt feels she will need to set a timer to make sure she gets to her HEP                [facility] PT Assessment - 04/21/21 1236       Observation/Other Assessments   Observations supination, heel striking, poor balance on SLS , hyperextension of knees      Floor to Stand   Comments half kneeling method      Other:   Other/ Comments poor propioceptionof knee alignment and foot in half kneeling      Palpation   Palpation comment hypomobile midfoot, rays between II-III on L, tightness and tenderness at dorsum of foot                           [facility] Adult PT Treatment/Exercise - 04/21/21 1240       Therapeutic Activites    Other Therapeutic Activities practiced floor <> stand with baby in arms with method that promotes pelvic girdle stability      Neuro Re-ed    Neuro Re-ed Details  cued for floor  stretches, supported on blocks to promote anterior tilt of pelvicfloor/ adductor stretches, standing balancing strengthening exercises with stroller,  floor <> stand with baby method with support on bench. not with half kneeling to promote moe pelvic girdle stability.      Exercises   Exercises Other Exercises    Other Exercises  see pt instructions                          PT Long Term Goals - 03/27/21 1220       PT LONG TERM GOAL #1   Title Pt will demo equal alignment of pelvic floor and shoulder to progress to deep core coordination/ strengthening for pelvic support during last trimester of pregnancy    Baseline L iliac crest higher , L shoulder lower    Time 4    Period Weeks    Status Achieved      PT LONG TERM GOAL #2   Title Pt will demo IND with HEP    Time 2  Period Weeks    Status On-going      PT LONG TERM GOAL #3   Title Pt will demo IND with body mechanics  and pelvic floor mm stretches to minmize straining pelvic floor and back and improving pain    Time 8    Period Weeks    Status Achieved      PT LONG TERM GOAL #4   Title Pt will demo proper deep core coordination HEP in order to help with  bowel movements and pelvic girdle stability to minimize pubic pain for standing/ getting/out of bed    Baseline straining with bowel movements    Time 6    Period Weeks    Status Achieved      PT LONG TERM GOAL #5   Title Pt report daily BM with Type 4  across 25% of the time and no more straining    Baseline : bowel movemetns occur every other day. Type 1-2, Straining 100% of the time (Daily fluid intake 1/2 gal of water).    Time 10    Period Weeks    Status On-going      PT LONG TERM GOAL #6   Title Pt's FOTO scores will improve Bowel 52 pt --> > 72 pts, Pain 33 pt --> < 23 pts in order to improve QOL    Time 10    Period Weeks    Status On-going                   Plan - 04/21/21 1235     Clinical Impression Statement Pt required  cues for floor stretches, required use of blocks to promote anterior tilt of pelvic floor/ adductor stretches. Pt required cues to not hyperextend knees and supinate with standing balancing strengthening exercises with stroller. Educated pt on floor <> stand technique while holding baby with use of support on bench and not with half kneeling to promote moe pelvic girdle stability. Initiated standing fitness exercises with baby in stroller. Pt had lower kinetic chain chain deficits at L foot which is associated with L adductor and pelvic floor mm tightness that were areas addressed during past sessions. Plan to continues to use regional interdependent approaches to help pt return to fitness with decreased risk for injuries/ overactivity of pelvic floor.  Pt benefits from skilled PT    Examination-Activity Limitations Sit;Squat;Stand;Toileting    Stability/Clinical Decision Making Evolving/Moderate complexity    Rehab Potential Good    PT Frequency 1x / week    PT Duration Other (comment)   10   PT Treatment/Interventions Moist Heat;Therapeutic activities;Functional mobility training;Stair training;Gait training;Neuromuscular re-education;Balance training;Patient/family education;Therapeutic exercise;Cryotherapy;Passive range of motion;Manual techniques;Scar mobilization;Manual lymph drainage;Taping;Splinting;Energy conservation;Joint Manipulations    Consulted and Agree with Plan of Care Patient             Patient will benefit from skilled therapeutic intervention in order to improve the following deficits and impairments:  Abnormal gait, Improper body mechanics, Pain, Increased muscle spasms, Decreased scar mobility, Decreased coordination, Decreased endurance, Decreased range of motion, Decreased activity tolerance, Hypomobility, Difficulty walking, Postural dysfunction, Decreased balance, Decreased mobility, Decreased strength  Visit Diagnosis: Other muscle spasm  Sacrococcygeal disorders,  not elsewhere classified  Other abnormalities of gait and mobility     Problem List Patient Active Problem List   Diagnosis Date Noted   Preeclampsia in postpartum period 02/08/2021   Encounter for induction of labor 01/28/2021   BMI 36.0-36.9,adult 01/27/2021   Fibroids 06/04/2018   Asthma  05/18/2018   Intramural leiomyoma of uterus 05/18/2018   Personal history of MRSA (methicillin resistant Staphylococcus aureus) 05/18/2018    [name], PT 04/21/2021, 12:44 PM  [place] MAIN [facility] SERVICES [address] [place], [place], [address] Phone: [phone]   Fax:  [phone]  Name: Carolyn Cole MRN: [id number] Date of Birth: 06-Oct-1990

## 2021-04-22 DIAGNOSIS — F4323 Adjustment disorder with mixed anxiety and depressed mood: Secondary | ICD-10-CM | POA: Diagnosis not present

## 2021-04-29 ENCOUNTER — Other Ambulatory Visit: Payer: Self-pay

## 2021-04-29 ENCOUNTER — Ambulatory Visit: Payer: Medicaid Other | Admitting: Physical Therapy

## 2021-04-29 DIAGNOSIS — M62838 Other muscle spasm: Secondary | ICD-10-CM

## 2021-04-29 DIAGNOSIS — M533 Sacrococcygeal disorders, not elsewhere classified: Secondary | ICD-10-CM

## 2021-04-29 DIAGNOSIS — R2689 Other abnormalities of gait and mobility: Secondary | ICD-10-CM

## 2021-04-29 NOTE — Patient Instructions (Signed)
Practice proper pelvic floor coordination  Inhale: expand pelvic floor muscles Exhale" "j" scoop, allow pelvic floor to close, lift first before belly sinks   ( not "draw abdominal muscle to spine" or strain with abdominal muscles")

## 2021-04-29 NOTE — Therapy (Signed)
[place] MAIN [facility] SERVICES [address] [place], [place], [address] Phone: [phone]   Fax:  [phone]  Physical Therapy Treatment  Patient Details  Name: Carolyn Cole MRN: [id number] Date of Birth: August 04, 1990 Referring Provider (PT): [place], [place],   Encounter Date: 04/29/2021   PT End of Session - 04/29/21 1303     Visit Number 9    Number of Visits 10    Date for PT Re-Evaluation 05/29/21    Authorization Type Medicaid    PT Start Time 1108    PT Stop Time 9326    PT Time Calculation (min) 56 min    Activity Tolerance Patient tolerated treatment well;No increased pain    Behavior During Therapy WFL for tasks assessed/performed             Past Medical History:  Diagnosis Date   Anemia    Fibroids    Papilledema, both eyes    Vertigo     Past Surgical History:  Procedure Laterality Date   APPENDECTOMY      There were no vitals filed for this visit.   Subjective Assessment - 04/29/21 1113     Subjective Pt set alarms to remeber to do her exercise. Pt tried last exercises and is feeling tighter at her low ab. Pt noticed less urinary leakage                [facility] PT Assessment - 04/29/21 1303       Posture/Postural Control   Posture Comments anterior COM, not hyperextended knees, stronger posture, without cues                        Pelvic Floor Special Questions - 04/29/21 1115     Pelvic Floor Internal Exam pt consented verbally and had no contraindications    Exam Type Vaginal    Palpation tenderness and tightness at 2-3 rd layers, 3-5 o'clock and at ischial rami / fossa L > R , overuse of ab and limited upward movemetn with pelvic floor               [facility] Adult PT Treatment/Exercise - 04/29/21 1303       Therapeutic Activites    Other Therapeutic Activities explained relaxation principles for pelvic floor and nn system      Neuro Re-ed    Neuro Re-ed Details  cued  for less ab overuse, coordination to correct dyscoordination      Modalities   Modalities Moist Heat      Moist Heat Therapy   Moist Heat Location --   perineum, pt dressed.during guided restorative pose to stretch pelvic floor while learning relaxation principles     Manual Therapy   Internal Pelvic Floor STM/MWM at problem areas to lengthen pelvic floor, propioception training,                          PT Long Term Goals - 03/27/21 1220       PT LONG TERM GOAL #1   Title Pt will demo equal alignment of pelvic floor and shoulder to progress to deep core coordination/ strengthening for pelvic support during last trimester of pregnancy    Baseline L iliac crest higher , L shoulder lower    Time 4    Period Weeks    Status Achieved      PT LONG TERM GOAL #2   Title Pt will  demo IND with HEP    Time 2    Period Weeks    Status On-going      PT LONG TERM GOAL #3   Title Pt will demo IND with body mechanics  and pelvic floor mm stretches to minmize straining pelvic floor and back and improving pain    Time 8    Period Weeks    Status Achieved      PT LONG TERM GOAL #4   Title Pt will demo proper deep core coordination HEP in order to help with  bowel movements and pelvic girdle stability to minimize pubic pain for standing/ getting/out of bed    Baseline straining with bowel movements    Time 6    Period Weeks    Status Achieved      PT LONG TERM GOAL #5   Title Pt report daily BM with Type 4  across 25% of the time and no more straining    Baseline : bowel movemetns occur every other day. Type 1-2, Straining 100% of the time (Daily fluid intake 1/2 gal of water).    Time 10    Period Weeks    Status On-going      PT LONG TERM GOAL #6   Title Pt's FOTO scores will improve Bowel 52 pt --> > 72 pts, Pain 33 pt --> < 23 pts in order to improve QOL    Time 10    Period Weeks    Status On-going                   Plan - 04/29/21 1303      Clinical Impression Statement Pt demonstrates good carry over with postural improvements and stronger deep core. Pt is compliant with HEP. Pt reports less urinary leakage and pelvic pain but pt still required manual Tx for decreasing pelvic floor mm tensions today . Pt tolerated with lighter pressure and movement with mobilization techniques which accommodated her c/o tenderness and presentation of tightness. Pt required cues for less overuse of ab mm and demo'd improved upward pelvic floor movement post training. Pt will continue with skilled PT to further improve pelvic floor lengthening and coordination.    Examination-Activity Limitations Sit;Squat;Stand;Toileting    Stability/Clinical Decision Making Evolving/Moderate complexity    Rehab Potential Good    PT Frequency 1x / week    PT Duration Other (comment)   10   PT Treatment/Interventions Moist Heat;Therapeutic activities;Functional mobility training;Stair training;Gait training;Neuromuscular re-education;Balance training;Patient/family education;Therapeutic exercise;Cryotherapy;Passive range of motion;Manual techniques;Scar mobilization;Manual lymph drainage;Taping;Splinting;Energy conservation;Joint Manipulations    Consulted and Agree with Plan of Care Patient             Patient will benefit from skilled therapeutic intervention in order to improve the following deficits and impairments:  Abnormal gait, Improper body mechanics, Pain, Increased muscle spasms, Decreased scar mobility, Decreased coordination, Decreased endurance, Decreased range of motion, Decreased activity tolerance, Hypomobility, Difficulty walking, Postural dysfunction, Decreased balance, Decreased mobility, Decreased strength  Visit Diagnosis: Sacrococcygeal disorders, not elsewhere classified  Other abnormalities of gait and mobility  Other muscle spasm     Problem List Patient Active Problem List   Diagnosis Date Noted   Preeclampsia in postpartum period  02/08/2021   Encounter for induction of labor 01/28/2021   BMI 36.0-36.9,adult 01/27/2021   Fibroids 06/04/2018   Asthma 05/18/2018   Intramural leiomyoma of uterus 05/18/2018   Personal history of MRSA (methicillin resistant Staphylococcus aureus) 05/18/2018    [name], PT  04/29/2021, 1:07 PM  [place] MAIN [facility] SERVICES [address] [place], [place], [address] Phone: [phone]   Fax:  [phone]  Name: Carolyn Cole MRN: [id number] Date of Birth: [birth year]

## 2021-05-09 DIAGNOSIS — F4323 Adjustment disorder with mixed anxiety and depressed mood: Secondary | ICD-10-CM | POA: Diagnosis not present

## 2021-05-13 ENCOUNTER — Ambulatory Visit: Payer: Medicaid Other | Admitting: Physical Therapy

## 2021-05-16 DIAGNOSIS — F4323 Adjustment disorder with mixed anxiety and depressed mood: Secondary | ICD-10-CM | POA: Diagnosis not present

## 2021-05-19 DIAGNOSIS — D2272 Melanocytic nevi of left lower limb, including hip: Secondary | ICD-10-CM | POA: Diagnosis not present

## 2021-05-27 DIAGNOSIS — F4323 Adjustment disorder with mixed anxiety and depressed mood: Secondary | ICD-10-CM | POA: Diagnosis not present

## 2021-05-28 ENCOUNTER — Encounter: Payer: Medicaid Other | Admitting: Physical Therapy

## 2021-06-05 DIAGNOSIS — D2272 Melanocytic nevi of left lower limb, including hip: Secondary | ICD-10-CM | POA: Diagnosis not present

## 2021-06-05 DIAGNOSIS — L905 Scar conditions and fibrosis of skin: Secondary | ICD-10-CM | POA: Diagnosis not present

## 2021-06-09 ENCOUNTER — Other Ambulatory Visit: Payer: Self-pay

## 2021-06-09 ENCOUNTER — Ambulatory Visit: Payer: Medicaid Other | Attending: Obstetrics and Gynecology | Admitting: Physical Therapy

## 2021-06-09 DIAGNOSIS — R2689 Other abnormalities of gait and mobility: Secondary | ICD-10-CM | POA: Diagnosis not present

## 2021-06-09 DIAGNOSIS — M62838 Other muscle spasm: Secondary | ICD-10-CM | POA: Diagnosis present

## 2021-06-09 DIAGNOSIS — M533 Sacrococcygeal disorders, not elsewhere classified: Secondary | ICD-10-CM | POA: Insufficient documentation

## 2021-06-09 DIAGNOSIS — F4323 Adjustment disorder with mixed anxiety and depressed mood: Secondary | ICD-10-CM | POA: Diagnosis not present

## 2021-06-09 NOTE — Patient Instructions (Signed)
° °  Feet slides :   Points of contact at sitting bones  Four points of contact of foot,  Side knee back while keeping knee out along 2-3rd toe line   Heel up, ankle not twist out Lower heel while keeping knee out along 2-3rd toe line Four points of contact of foot, Slide foot back while keeping knee out along 2-3rd toe line   Repeated with other foot     __  Body scan emailed to you for relaxation
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
## 2021-06-23 ENCOUNTER — Encounter: Payer: Medicaid Other | Admitting: Physical Therapy

## 2021-06-30 DIAGNOSIS — D259 Leiomyoma of uterus, unspecified: Secondary | ICD-10-CM | POA: Diagnosis not present

## 2021-07-01 DIAGNOSIS — F4323 Adjustment disorder with mixed anxiety and depressed mood: Secondary | ICD-10-CM | POA: Diagnosis not present

## 2021-07-07 ENCOUNTER — Ambulatory Visit: Payer: Medicaid Other | Attending: Obstetrics and Gynecology | Admitting: Physical Therapy

## 2021-07-07 ENCOUNTER — Other Ambulatory Visit: Payer: Self-pay

## 2021-07-07 DIAGNOSIS — M533 Sacrococcygeal disorders, not elsewhere classified: Secondary | ICD-10-CM | POA: Insufficient documentation

## 2021-07-07 DIAGNOSIS — M62838 Other muscle spasm: Secondary | ICD-10-CM | POA: Diagnosis present

## 2021-07-07 DIAGNOSIS — R2689 Other abnormalities of gait and mobility: Secondary | ICD-10-CM | POA: Diagnosis present
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
## 2021-07-07 NOTE — Patient Instructions (Signed)
Abdominal massage upward from L,  R , center to belly button 3 stroke x 3 , pressure is gentle and light with all fingers flat, not using finger tips   ? ?Do not do when you are on your period  ?

## 2021-07-08 DIAGNOSIS — F4323 Adjustment disorder with mixed anxiety and depressed mood: Secondary | ICD-10-CM | POA: Diagnosis not present

## 2021-07-22 ENCOUNTER — Ambulatory Visit: Payer: Medicaid Other | Attending: Obstetrics and Gynecology | Admitting: Physical Therapy

## 2021-07-22 DIAGNOSIS — R2689 Other abnormalities of gait and mobility: Secondary | ICD-10-CM | POA: Insufficient documentation

## 2021-07-22 DIAGNOSIS — M533 Sacrococcygeal disorders, not elsewhere classified: Secondary | ICD-10-CM | POA: Diagnosis present

## 2021-07-22 DIAGNOSIS — M62838 Other muscle spasm: Secondary | ICD-10-CM | POA: Insufficient documentation

## 2021-07-22 NOTE — Patient Instructions (Signed)
Over bench  ? ?Hip flexor stretch, foot on bench ? ?Adductor stretch ? ?Twist  ? ?Hamstring  ? ? ?___ ? ? ?Open book ? ? Angel wings  ? ? ?Open book other side  ? ? ?Neck streches: ? ? 6 directions  ? ?

## 2021-07-23 DIAGNOSIS — F4323 Adjustment disorder with mixed anxiety and depressed mood: Secondary | ICD-10-CM | POA: Diagnosis not present
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
## 2021-08-04 ENCOUNTER — Ambulatory Visit: Payer: Medicaid Other | Admitting: Physical Therapy

## 2021-08-04 ENCOUNTER — Telehealth: Payer: Self-pay | Admitting: Physical Therapy

## 2021-08-04 NOTE — Telephone Encounter (Signed)
Therapist called pt and left message to f/u about missed appt and if she would like to r/s this week by calling front desk. ?

## 2021-08-11 DIAGNOSIS — F4323 Adjustment disorder with mixed anxiety and depressed mood: Secondary | ICD-10-CM | POA: Diagnosis not present

## 2021-08-18 DIAGNOSIS — F4323 Adjustment disorder with mixed anxiety and depressed mood: Secondary | ICD-10-CM | POA: Diagnosis not present

## 2021-08-19 ENCOUNTER — Telehealth: Payer: Self-pay | Admitting: Physical Therapy

## 2021-08-19 ENCOUNTER — Ambulatory Visit: Payer: Medicaid Other | Attending: Obstetrics and Gynecology | Admitting: Physical Therapy

## 2021-08-19 DIAGNOSIS — R2689 Other abnormalities of gait and mobility: Secondary | ICD-10-CM | POA: Insufficient documentation

## 2021-08-19 DIAGNOSIS — M533 Sacrococcygeal disorders, not elsewhere classified: Secondary | ICD-10-CM | POA: Insufficient documentation

## 2021-08-19 DIAGNOSIS — M62838 Other muscle spasm: Secondary | ICD-10-CM | POA: Insufficient documentation

## 2021-08-19 NOTE — Telephone Encounter (Signed)
Left VM re: today's appt and to confirm if she needs 5/30 appt.  ?

## 2021-08-21 ENCOUNTER — Ambulatory Visit: Payer: Medicaid Other | Admitting: Physical Therapy

## 2021-08-21 VITALS — BP 128/61

## 2021-08-21 DIAGNOSIS — M533 Sacrococcygeal disorders, not elsewhere classified: Secondary | ICD-10-CM

## 2021-08-21 DIAGNOSIS — R2689 Other abnormalities of gait and mobility: Secondary | ICD-10-CM | POA: Diagnosis present

## 2021-08-21 DIAGNOSIS — M62838 Other muscle spasm: Secondary | ICD-10-CM
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
## 2021-08-26 ENCOUNTER — Ambulatory Visit: Payer: Medicaid Other | Admitting: Physical Therapy

## 2021-08-26 DIAGNOSIS — R2689 Other abnormalities of gait and mobility: Secondary | ICD-10-CM

## 2021-08-26 DIAGNOSIS — M62838 Other muscle spasm: Secondary | ICD-10-CM

## 2021-08-26 DIAGNOSIS — M533 Sacrococcygeal disorders, not elsewhere classified: Secondary | ICD-10-CM | POA: Diagnosis not present
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
## 2021-08-26 NOTE — Patient Instructions (Signed)
Scoot hips to L , Cross L thigh over  ?Stretch L  ? ?__ ? ?Carry baby on R and alternate with L  ?

## 2021-09-01 ENCOUNTER — Ambulatory Visit: Payer: Medicaid Other | Admitting: Physical Therapy

## 2021-09-09 ENCOUNTER — Ambulatory Visit: Payer: Medicaid Other | Admitting: Physical Therapy

## 2021-09-10 DIAGNOSIS — F4323 Adjustment disorder with mixed anxiety and depressed mood: Secondary | ICD-10-CM | POA: Diagnosis not present

## 2021-09-15 ENCOUNTER — Encounter: Payer: Self-pay | Admitting: Diagnostic Neuroimaging

## 2021-09-15 ENCOUNTER — Ambulatory Visit: Payer: Medicaid Other | Admitting: Diagnostic Neuroimaging

## 2021-09-15 VITALS — BP 115/73 | HR 70 | Ht 64.0 in | Wt 228.0 lb

## 2021-09-15 DIAGNOSIS — G4489 Other headache syndrome: Secondary | ICD-10-CM

## 2021-09-15 DIAGNOSIS — H4710 Unspecified papilledema: Secondary | ICD-10-CM

## 2021-09-15 DIAGNOSIS — F4323 Adjustment disorder with mixed anxiety and depressed mood: Secondary | ICD-10-CM | POA: Diagnosis not present

## 2021-09-15 NOTE — Progress Notes (Signed)
[facility]  PATIENT: Carolyn Cole DOB: October 06, 1990  REFERRING CLINICIAN: Minette Brine, FNP HISTORY FROM: patient  REASON FOR VISIT: follow up   HISTORICAL  CHIEF COMPLAINT:  Chief Complaint  Patient presents with   Follow-up    RM 7 alone here for 6 month f/u. Pt reports dizziness sx remain the same. Has noticed any improvements since her last f/u.     HISTORY OF PRESENT ILLNESS:   UPDATE (09/15/21, VRP): Since last visit, doing well. Symptoms are slightly better. Eye exam is improved.   UPDATE (03/17/21, VRP): Since last visit, sxs improved, then skipped MRI. Then pregnant, and delivered heathy baby in Oct 2022. Had post-partum preeclampsia sxs. Now better. But also having headaches, blurred vision like in 2021. HA are 4x per week. Positive photophobia; no nausea. Gen pressure and tension.   PRIOR HPI (04/08/20): 31 year old female here for evaluation of papilledema.  For past 1 year patient has had intermittent headaches on the front or side of her head.  Sometimes she has ringing in the ears.  No blurred vision, nausea, sensitive to light or sound.  She was also having some intermittent "dizziness" sensations which she describes as lightheadedness and faint sensation when she stands up or moves too quickly.  No spinning sensation or nausea.  She was also having some problems with visual attention and focusing, especially when shopping at the store or using her phone.  This is not a blurred vision problem but but instead a difficulty processing the information.  She also had some eye twitching and muscle twitching on the left side.    Patient has had weight fluctuation over the past 2 to 3 years ranging from 170 up to 215 pounds.  She is currently at her highest weight.  Patient went to optometrist in October 2021 for evaluation and was found to have bilateral papilledema on dilated retinal exam and OCT testing.  Overall symptoms have improved since Oct  2021.   REVIEW OF SYSTEMS: Full 14 system review of systems performed and negative with exception of: As per HPI.  ALLERGIES: No Known Allergies  HOME MEDICATIONS: Outpatient Medications Prior to Visit  Medication Sig Dispense Refill   Prenatal Vit-Fe Fumarate-FA (PRENATAL MULTIVITAMIN) TABS tablet Take 1 tablet by mouth daily at 12 noon.     ferrous sulfate 325 (65 FE) MG tablet Take 1 tablet (325 mg total) by mouth 2 (two) times daily with a meal. 60 tablet 3   No facility-administered medications prior to visit.    PAST MEDICAL HISTORY: Past Medical History:  Diagnosis Date   Anemia    Fibroids    Papilledema, both eyes    Vertigo     PAST SURGICAL HISTORY: Past Surgical History:  Procedure Laterality Date   APPENDECTOMY      FAMILY HISTORY: Family History  Problem Relation Age of Onset   Thyroid disease Mother    Hypertension Mother    Hypertension Father    Ulcers Father    Gout Paternal Grandmother    Chiari malformation Sister     SOCIAL HISTORY: Social History   Socioeconomic History   Marital status: Single    Spouse name: Not on file   Number of children: 0   Years of education: Not on file   Highest education level: Master's degree (e.g., MA, MS, MEng, MEd, MSW, MBA)  Occupational History   Not on file  Tobacco Use   Smoking status: Never   Smokeless tobacco: Never  Vaping Use  Vaping Use: Never used  Substance and Sexual Activity   Alcohol use: Not Currently   Drug use: Never   Sexual activity: Not on file  Other Topics Concern   Not on file  Social History Narrative   Caffeine- green tea   Social Determinants of Health   Financial Resource Strain: Not on file  Food Insecurity: Not on file  Transportation Needs: Not on file  Physical Activity: Not on file  Stress: Not on file  Social Connections: Not on file  Intimate Partner Violence: Not on file     PHYSICAL EXAM  GENERAL EXAM/CONSTITUTIONAL: Vitals:  Vitals:    09/15/21 1432  BP: 115/73  Pulse: 70  Weight: 228 lb (103.4 kg)  Height: '5\' 4"'$  (1.626 m)   Body mass index is 39.14 kg/m. Wt Readings from Last 3 Encounters:  09/15/21 228 lb (103.4 kg)  03/17/21 221 lb (100.2 kg)  02/09/21 242 lb 1 oz (109.8 kg)   Patient is in no distress; well developed, nourished and groomed; neck is supple  CARDIOVASCULAR: Examination of carotid arteries is normal; no carotid bruits Regular rate and rhythm, no murmurs Examination of peripheral vascular system by observation and palpation is normal  EYES: Ophthalmoscopic exam of optic discs and posterior segments is normal; no papilledema or hemorrhages No results found.  MUSCULOSKELETAL: Gait, strength, tone, movements noted in Neurologic exam below  NEUROLOGIC: MENTAL STATUS:      View : No data to display.         awake, alert, oriented to person, place and time recent and remote memory intact normal attention and concentration language fluent, comprehension intact, naming intact fund of knowledge appropriate  CRANIAL NERVE:  2nd - BLURRED OPTIC [place] 2nd, 3rd, 4th, 6th - pupils equal and reactive to light, visual fields full to confrontation, extraocular muscles intact, no nystagmus 5th - facial sensation symmetric 7th - facial strength symmetric 8th - hearing intact 9th - palate elevates symmetrically, uvula midline 11th - shoulder shrug symmetric 12th - tongue protrusion midline  MOTOR:  normal bulk and tone, full strength in the BUE, BLE  SENSORY:  normal and symmetric to light touch, temperature, vibration  COORDINATION:  finger-nose-finger, fine finger movements normal  REFLEXES:  deep tendon reflexes present and symmetric  GAIT/STATION:  narrow based gait     DIAGNOSTIC DATA (LABS, IMAGING, TESTING) - I reviewed patient records, labs, notes, testing and imaging myself where available.  Lab Results  Component Value Date   WBC 5.2 02/09/2021   HGB 13.2  02/09/2021   HCT 39.1 02/09/2021   MCV 97.5 02/09/2021   PLT 271 02/09/2021      Component Value Date/Time   NA 139 02/09/2021 0506   K 3.9 02/09/2021 0506   CL 103 02/09/2021 0506   CO2 26 02/09/2021 0506   GLUCOSE 100 (H) 02/09/2021 0506   BUN 6 02/09/2021 0506   CREATININE 0.72 02/09/2021 0506   CALCIUM 8.2 (L) 02/09/2021 0506   PROT 5.8 (L) 02/09/2021 0506   ALBUMIN 3.7 02/09/2021 0506   AST 33 02/09/2021 0506   ALT 43 02/09/2021 0506   ALKPHOS 105 02/09/2021 0506   BILITOT 0.8 02/09/2021 0506   GFRNONAA >60 02/09/2021 0506   No results found for: CHOL, HDL, LDLCALC, LDLDIRECT, TRIG, CHOLHDL No results found for: HGBA1C No results found for: VITAMINB12 Lab Results  Component Value Date   TSH 1.940 11/09/2019     12/22/19 CT head [I reviewed images myself and agree with interpretation. -  VRP]  - no acute findings  04/01/21 Normal MRI brain (with and without).      ASSESSMENT AND PLAN  31 y.o. year old female here with 1 year of intermittent lightheadedness, dizziness, difficulty with visual attention, intermittent ringing in ears, weight gain, and found to have papilledema.  May represent idiopathic intracranial hypertension.  Dx:  1. Papilledema   2. Other headache syndrome     PLAN:  PAPILLEDEMA / DIFFICULTY FOCUSING / TWITCHING (improving) - improving; follow up Dr. Sharren Bridge (optometry); if papilledema worsening, then consider LP and acetazolamide  Return for pending if symptoms worsen or fail to improve.    Penni Bombard, MD 0/09/2692, 8:54 PM Certified in Neurology, Neurophysiology and Neuroimaging  [facility] [address] [place], [place] [address] [phone]

## 2021-09-15 NOTE — Patient Instructions (Signed)
PAPILLEDEMA / DIFFICULTY FOCUSING / TWITCHING (improving) - improving; follow up Dr. Sharren Bridge (optometry); if papilledema worsening, then consider LP and acetazolamide

## 2021-09-17 DIAGNOSIS — L910 Hypertrophic scar: Secondary | ICD-10-CM | POA: Diagnosis not present

## 2021-09-17 DIAGNOSIS — L089 Local infection of the skin and subcutaneous tissue, unspecified: Secondary | ICD-10-CM | POA: Diagnosis not present

## 2021-10-07 DIAGNOSIS — Z6838 Body mass index (BMI) 38.0-38.9, adult: Secondary | ICD-10-CM | POA: Diagnosis not present

## 2021-10-07 DIAGNOSIS — N946 Dysmenorrhea, unspecified: Secondary | ICD-10-CM | POA: Diagnosis not present

## 2021-10-07 DIAGNOSIS — D259 Leiomyoma of uterus, unspecified: Secondary | ICD-10-CM | POA: Diagnosis not present

## 2021-10-07 DIAGNOSIS — N942 Vaginismus: Secondary | ICD-10-CM | POA: Diagnosis not present

## 2021-10-07 DIAGNOSIS — E669 Obesity, unspecified: Secondary | ICD-10-CM | POA: Diagnosis not present

## 2021-10-10 DIAGNOSIS — F4323 Adjustment disorder with mixed anxiety and depressed mood: Secondary | ICD-10-CM | POA: Diagnosis not present

## 2021-10-20 ENCOUNTER — Ambulatory Visit: Payer: Medicaid Other | Attending: Obstetrics and Gynecology | Admitting: Physical Therapy

## 2021-10-20 DIAGNOSIS — M62838 Other muscle spasm: Secondary | ICD-10-CM | POA: Diagnosis present

## 2021-10-20 DIAGNOSIS — M533 Sacrococcygeal disorders, not elsewhere classified: Secondary | ICD-10-CM | POA: Diagnosis not present

## 2021-10-20 DIAGNOSIS — R2689 Other abnormalities of gait and mobility: Secondary | ICD-10-CM | POA: Insufficient documentation

## 2021-10-20 NOTE — Therapy (Signed)
[place] MAIN [facility] SERVICES [address] [place], [place], [address] Phone: [phone]   Fax:  [phone]  Physical Therapy Treatment / Discharge Summary across 15 visits   Patient Details  Name: Carolyn Cole MRN: [id number] Date of Birth: Jul 31, 1990 Referring Provider (PT): [place], [place],   Encounter Date: 10/20/2021   PT End of Session - 10/20/21 1542     Visit Number 15    Date for PT Re-Evaluation 10/30/21   eval 01/08/21, PN 06/09/21   Authorization Type Medicaid    PT Start Time 1540    PT Stop Time 1610    PT Time Calculation (min) 30 min    Activity Tolerance Patient tolerated treatment well;No increased pain    Behavior During Therapy WFL for tasks assessed/performed             Past Medical History:  Diagnosis Date   Anemia    Fibroids    Papilledema, both eyes    Vertigo     Past Surgical History:  Procedure Laterality Date   APPENDECTOMY      There were no vitals filed for this visit.   Subjective Assessment - 10/20/21 1617     Subjective Pr reports no pain with sexual intercourse. Pt continues to do her HEP. Pt returned to [employer] classes and loves it                [facility] PT Assessment - 10/20/21 1605       Coordination   Coordination and Movement Description simulated Zumba moves: hyperextended knees      Squat   Comments proper alignment, no lumbar lordosis                           [facility] Adult PT Treatment/Exercise - 10/20/21 1618       Therapeutic Activites    Other Therapeutic Activities discussed d/c. reviewed goals,      Neuro Re-ed    Neuro Re-ed Details  cued for new exercise, toe abduction in squat, and not hyperextended knees in simualted Zumba moves                          PT Long Term Goals - 10/20/21 1615       PT LONG TERM GOAL #1   Title Pt will demo equal alignment of pelvic floor and shoulder to progress to deep core  coordination/ strengthening for pelvic support during last trimester of pregnancy    Baseline L iliac crest higher , L shoulder lower    Period Weeks    Status Achieved      PT LONG TERM GOAL #2   Title Pt will demo IND with HEP    Baseline 06/09/21: pt is trying new strategies to be more compliant    Time 2    Period Weeks    Status Achieved    Target Date 07/07/21      PT LONG TERM GOAL #3   Title Pt will demo IND with body mechanics  and pelvic floor mm stretches to minmize straining pelvic floor and back and improving pain    Time 8    Period Weeks    Status Achieved    Target Date 03/06/21      PT LONG TERM GOAL #4   Title Pt will demo proper deep core coordination HEP in order to help with  bowel movements and  pelvic girdle stability to minimize pubic pain for standing/ getting/out of bed    Baseline straining with bowel movements    Time 6    Period Weeks    Status Achieved      PT LONG TERM GOAL #5   Title Pt report daily BM with Type 4  across 25% of the time and no more straining ( 07/22/21: 90% )    Baseline : bowel movements occur every other day. Type 1-2, Straining 100% of the time (Daily fluid intake 1/2 gal of water).    Time 10    Period Weeks    Status Achieved    Target Date 08/18/21      PT LONG TERM GOAL #6   Title Pt's FOTO scores will improve PFDI bowel 29 pts to < 27 pts ( d/c 0 pts) , Pain 33 pt --> < 23 pts ( d/c 8pts)   in order to improve QOL    Time 10    Period Weeks    Status Achieved      PT LONG TERM GOAL #7   Title PT will demo increased feet mobility, less knee adduction in order to maintain more SIJ mobility and optimize pelvic function    Baseline hypomobile feet, knee adducted , supinationof feet    Time 4    Period Weeks    Status Achieved    Target Date 07/07/21                   Plan - 10/20/21 1619     Clinical Impression Statement Pt achieved 100% of her goals. Pt has returned to [employer] classes and sexual  intercourse without pain. Pt has regular bowel movments without straining which is healthier for pelvic function. Pt demonstrates improved alignment of pelvis and spine, decreased pelvic floor and global mm tightness, stronger deep core strength, posture, and demo'd proper alignment with fitness workout movements and body mechanics. 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 Pt is ready for d/c.      Examination-Activity Limitations Sit;Squat;Stand;Toileting    Stability/Clinical Decision Making Evolving/Moderate complexity    Rehab Potential Good    PT Frequency 1x / week    PT Duration Other (comment)   10   PT Treatment/Interventions Moist Heat;Therapeutic activities;Functional mobility training;Stair training;Gait training;Neuromuscular re-education;Balance training;Patient/family education;Therapeutic exercise;Cryotherapy;Passive range of motion;Manual techniques;Scar mobilization;Manual lymph drainage;Taping;Splinting;Energy conservation;Joint Manipulations    Consulted and Agree with Plan of Care Patient             Patient will benefit from skilled therapeutic intervention in order to improve the following deficits and impairments:  Abnormal gait, Improper body mechanics, Pain, Increased muscle spasms, Decreased scar mobility, Decreased coordination, Decreased endurance, Decreased range of motion, Decreased activity tolerance, Hypomobility, Difficulty walking, Postural dysfunction, Decreased balance, Decreased mobility, Decreased strength  Visit Diagnosis: Sacrococcygeal disorders, not elsewhere classified  Other muscle spasm  Other abnormalities of gait and mobility     Problem List Patient Active Problem List   Diagnosis Date Noted   Preeclampsia in postpartum period 02/08/2021   Encounter for induction of labor 01/28/2021   BMI 36.0-36.9,adult 01/27/2021   Fibroids 06/04/2018   Asthma 05/18/2018   Intramural leiomyoma of uterus 05/18/2018    Personal history of MRSA (methicillin resistant Staphylococcus aureus) 05/18/2018    [name], PT 10/20/2021, 4:20 PM  [place] [facility] SERVICES [place], [place], [address] Phone: [phone]  Fax:  [phone]  Name: Carolyn Cole MRN: [id number] Date of Birth: 01/04/1991

## 2021-10-20 NOTE — Patient Instructions (Signed)
Dolphin plank ( interlaced fingers, forearms triangle against the wall, mini squat , rise up with chin tucked  20 reps   __  Minisquat: Scoot buttocks back slight, hinge like you are looking at your reflection on a pond  Knees behind toes,  Inhale to "smell flowers"  Exhale on the rise "like rocket"  Do not lock knees, have more weight across ballmounds of feet, toes relaxed , ( TRY A FEW WITH TOES UP TO ENSURE BALLMOUNDS DOWN)   10 reps x 3 x day

## 2021-10-27 DIAGNOSIS — F4323 Adjustment disorder with mixed anxiety and depressed mood: Secondary | ICD-10-CM | POA: Diagnosis not present

## 2021-11-03 DIAGNOSIS — F4323 Adjustment disorder with mixed anxiety and depressed mood: Secondary | ICD-10-CM | POA: Diagnosis not present

## 2021-11-21 DIAGNOSIS — F4323 Adjustment disorder with mixed anxiety and depressed mood: Secondary | ICD-10-CM | POA: Diagnosis not present

## 2021-11-27 DIAGNOSIS — F4323 Adjustment disorder with mixed anxiety and depressed mood: Secondary | ICD-10-CM | POA: Diagnosis not present

## 2021-12-08 DIAGNOSIS — D259 Leiomyoma of uterus, unspecified: Secondary | ICD-10-CM | POA: Diagnosis not present

## 2021-12-08 DIAGNOSIS — N9412 Deep dyspareunia: Secondary | ICD-10-CM | POA: Diagnosis not present

## 2021-12-08 DIAGNOSIS — R102 Pelvic and perineal pain: Secondary | ICD-10-CM | POA: Diagnosis not present

## 2021-12-12 DIAGNOSIS — F4323 Adjustment disorder with mixed anxiety and depressed mood: Secondary | ICD-10-CM | POA: Diagnosis not present

## 2021-12-22 DIAGNOSIS — F4323 Adjustment disorder with mixed anxiety and depressed mood: Secondary | ICD-10-CM | POA: Diagnosis not present

## 2022-01-08 DIAGNOSIS — F4323 Adjustment disorder with mixed anxiety and depressed mood: Secondary | ICD-10-CM | POA: Diagnosis not present

## 2022-01-14 DIAGNOSIS — N946 Dysmenorrhea, unspecified: Secondary | ICD-10-CM | POA: Diagnosis not present

## 2022-01-14 DIAGNOSIS — D259 Leiomyoma of uterus, unspecified: Secondary | ICD-10-CM | POA: Diagnosis not present

## 2022-01-14 DIAGNOSIS — G4719 Other hypersomnia: Secondary | ICD-10-CM | POA: Diagnosis not present

## 2022-01-14 DIAGNOSIS — N941 Unspecified dyspareunia: Secondary | ICD-10-CM | POA: Diagnosis not present

## 2022-01-14 DIAGNOSIS — Z0000 Encounter for general adult medical examination without abnormal findings: Secondary | ICD-10-CM | POA: Diagnosis not present

## 2022-01-14 DIAGNOSIS — E669 Obesity, unspecified: Secondary | ICD-10-CM | POA: Diagnosis not present

## 2022-01-14 DIAGNOSIS — Z13220 Encounter for screening for lipoid disorders: Secondary | ICD-10-CM | POA: Diagnosis not present

## 2022-01-14 DIAGNOSIS — F419 Anxiety disorder, unspecified: Secondary | ICD-10-CM | POA: Diagnosis not present

## 2022-01-15 DIAGNOSIS — F4323 Adjustment disorder with mixed anxiety and depressed mood: Secondary | ICD-10-CM | POA: Diagnosis not present

## 2022-02-06 DIAGNOSIS — F4323 Adjustment disorder with mixed anxiety and depressed mood: Secondary | ICD-10-CM | POA: Diagnosis not present

## 2022-02-22 IMAGING — CT CT HEAD W/O CM
1 series · 16 of 30 positions shown, 20 images · non-contrast
Comparison: None.

CLINICAL DATA: Dizziness

EXAM:
CT HEAD WITHOUT CONTRAST
TECHNIQUE: Contiguous axial images were obtained from the base of the skull
through the vertex without intravenous contrast.

[Series 2: head w/(date) · axial · 0.41mm/px · z∈[+864,+1009]mm · 16 of 33 slices shown, 20 images]
[im 2/33  brain]
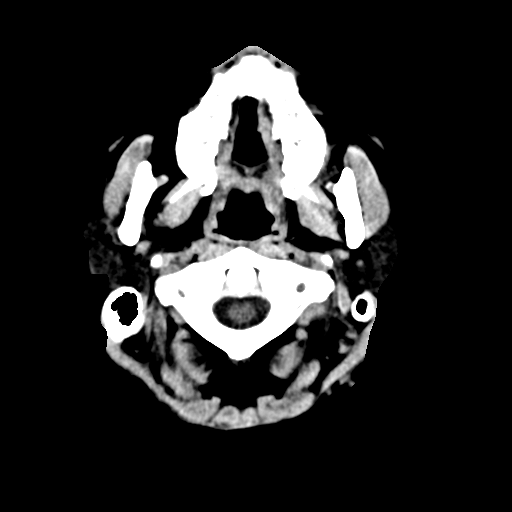
[im 2/33  bone]
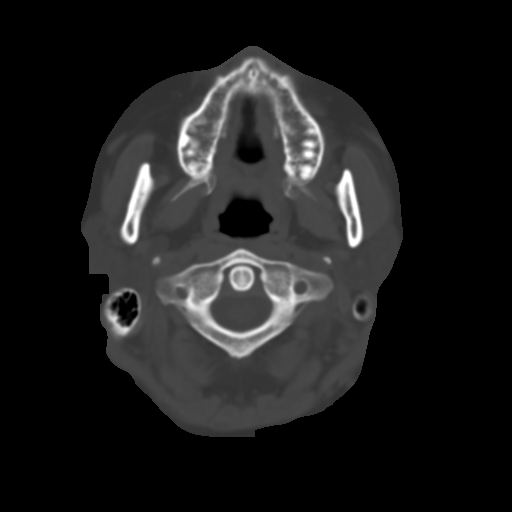
[im 4/33  brain]
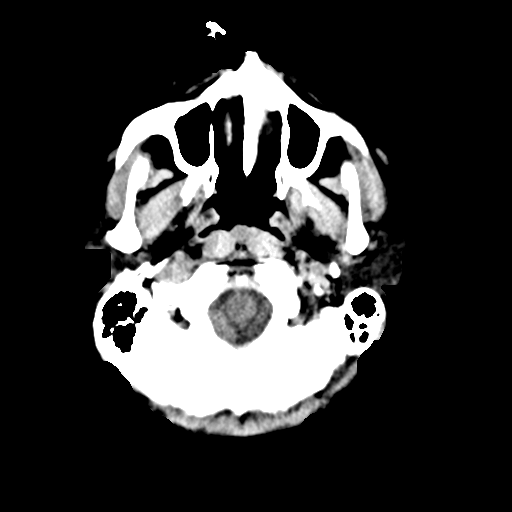
[im 6/33  brain]
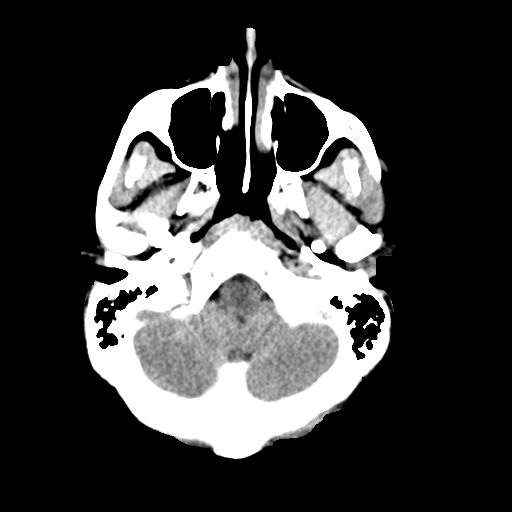
[im 8/33  brain]
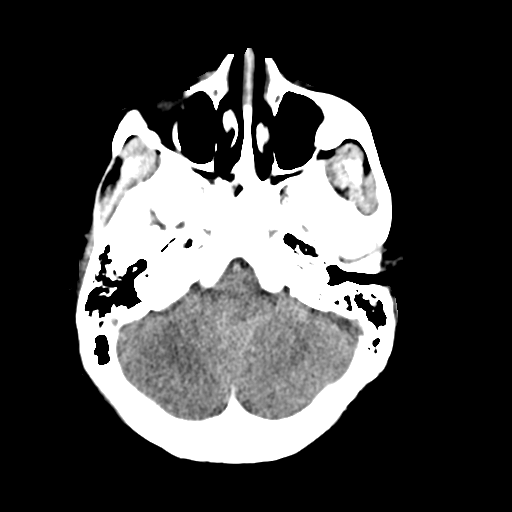
[im 9/33  brain]
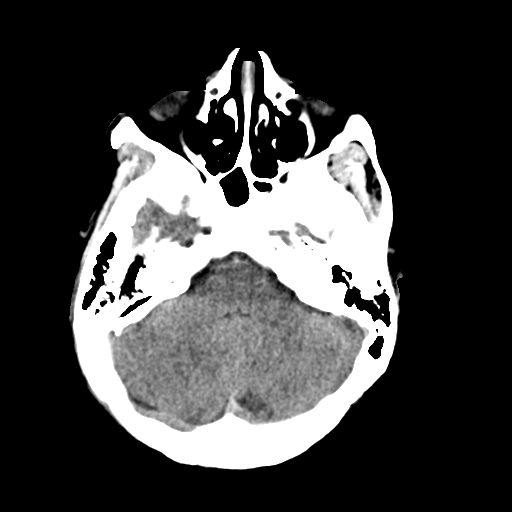
[im 9/33  bone]
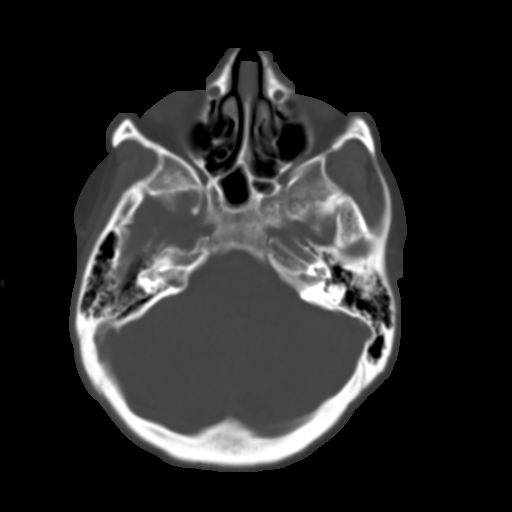
[im 12/33  brain]
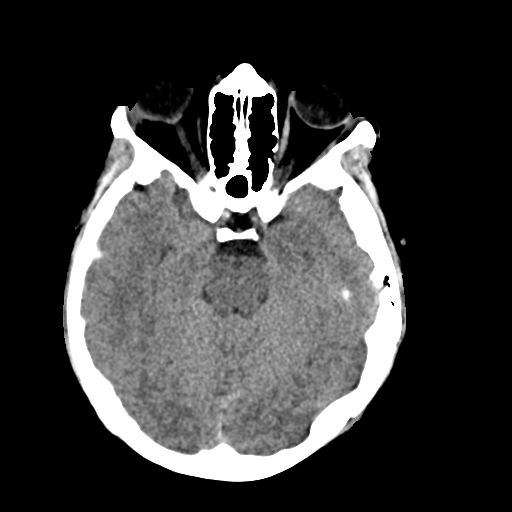
[im 14/33  brain]
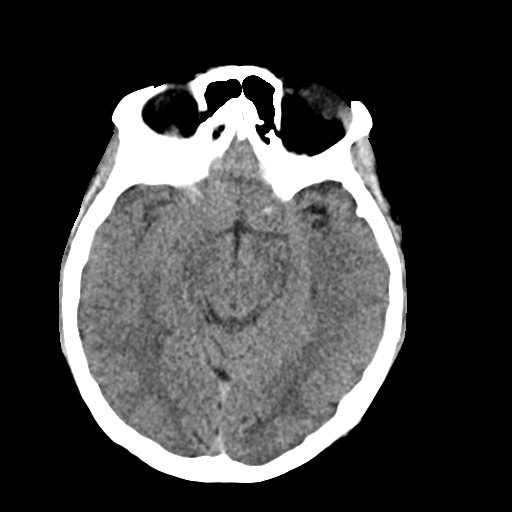
[im 16/33  brain]
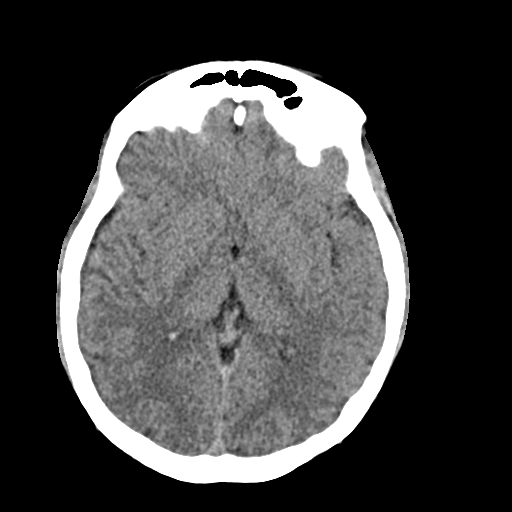
[im 17/33  brain]
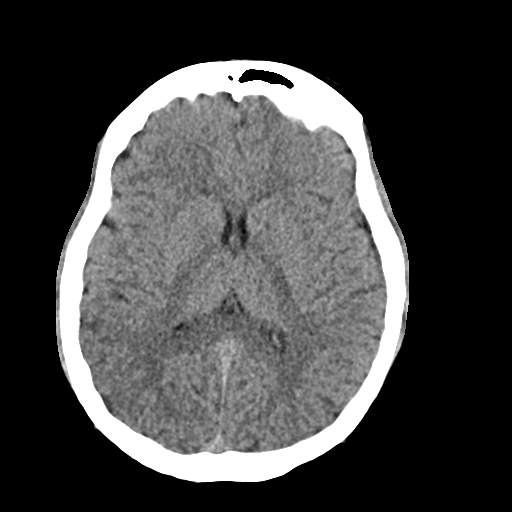
[im 17/33  bone]
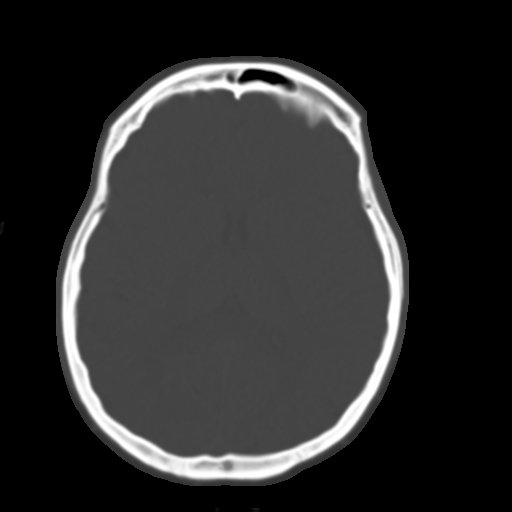
[im 19/33  brain]
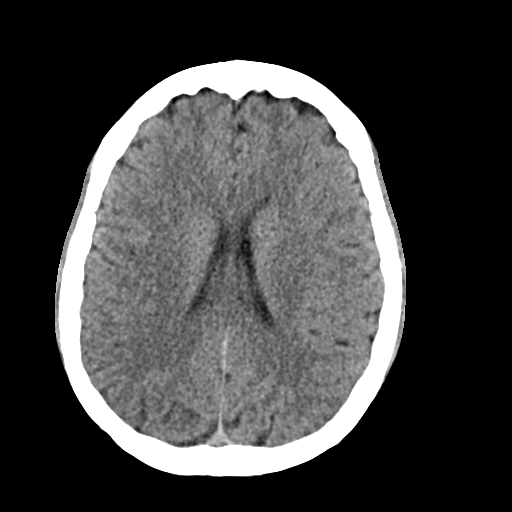
[im 21/33  brain]
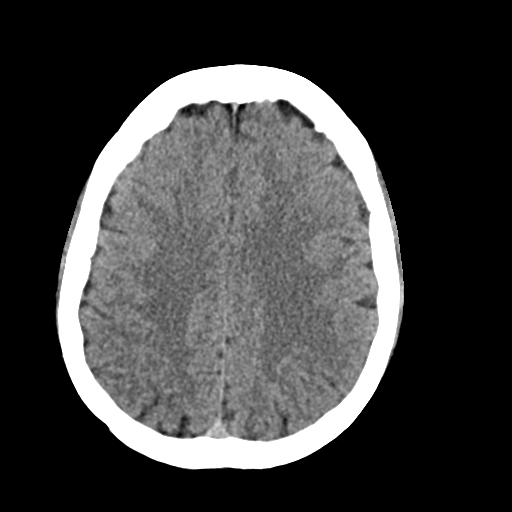
[im 24/33  brain]
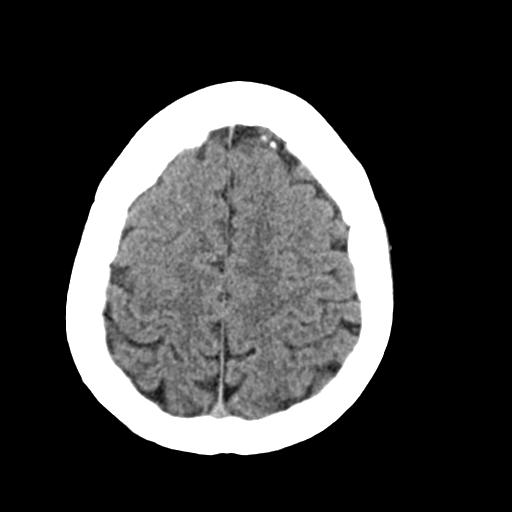
[im 25/33  brain]
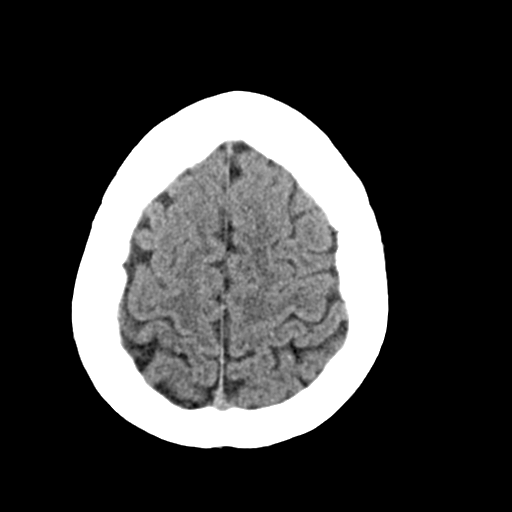
[im 25/33  bone]
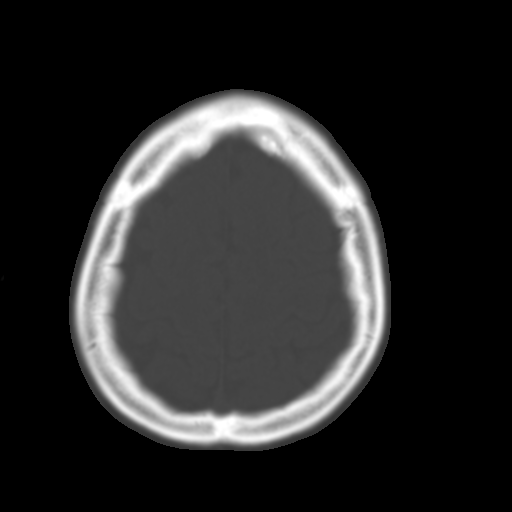
[im 27/33  brain]
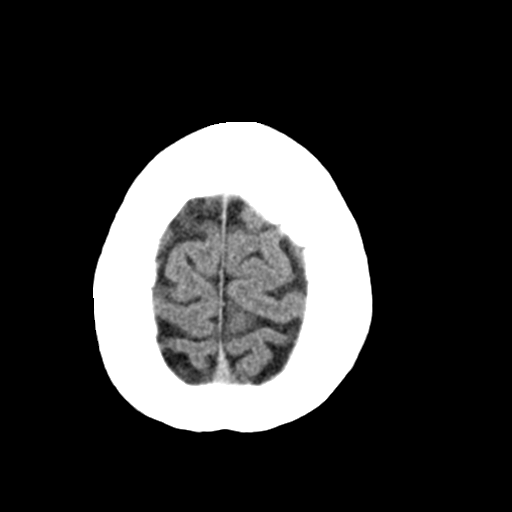
[im 29/33  brain]
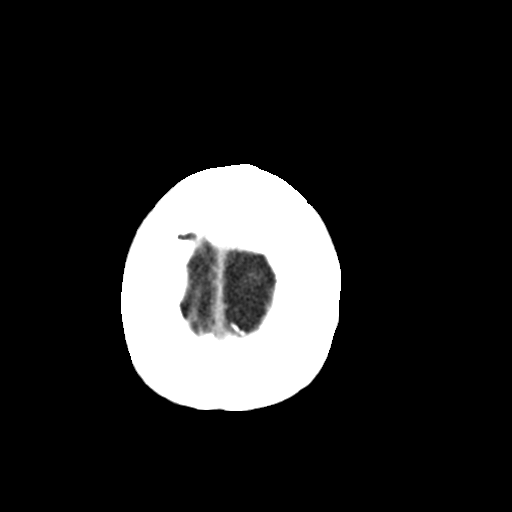
[im 31/33  brain]
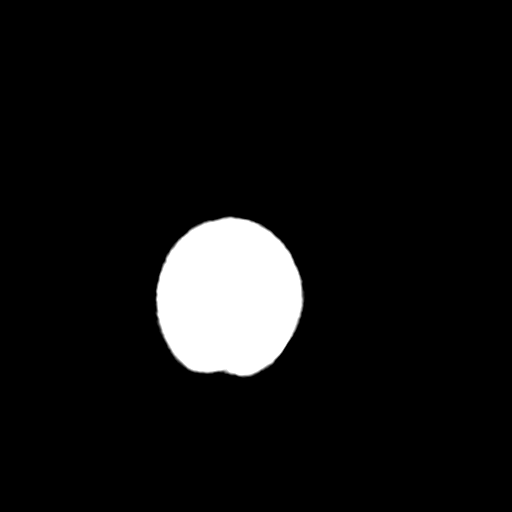

[16 of 30 positions shown; findings below may reference images not displayed]

FINDINGS: Brain: No evidence of acute infarction, hemorrhage, hydrocephalus,
extra-axial collection or mass lesion/mass effect.

Vascular: No hyperdense vessel or unexpected calcification.

Skull: Normal. Negative for fracture or focal lesion.

Sinuses/Orbits: No acute finding.

Other: None
IMPRESSION: Negative non contrasted CT appearance of the brain.

## 2022-04-23 DIAGNOSIS — F4323 Adjustment disorder with mixed anxiety and depressed mood: Secondary | ICD-10-CM | POA: Diagnosis not present

## 2022-05-05 DIAGNOSIS — F4323 Adjustment disorder with mixed anxiety and depressed mood: Secondary | ICD-10-CM | POA: Diagnosis not present

## 2022-05-12 ENCOUNTER — Other Ambulatory Visit: Payer: Self-pay | Admitting: Family Medicine

## 2022-05-12 ENCOUNTER — Ambulatory Visit
Admission: RE | Admit: 2022-05-12 | Discharge: 2022-05-12 | Disposition: A | Discharge status: Home / Self Care | Payer: Medicaid Other | Source: Ambulatory Visit | Attending: Family Medicine | Admitting: Family Medicine

## 2022-05-12 DIAGNOSIS — M549 Dorsalgia, unspecified: Secondary | ICD-10-CM | POA: Diagnosis not present

## 2022-05-12 DIAGNOSIS — M545 Low back pain, unspecified: Secondary | ICD-10-CM

## 2022-05-13 DIAGNOSIS — F4323 Adjustment disorder with mixed anxiety and depressed mood: Secondary | ICD-10-CM | POA: Diagnosis not present

## 2022-05-15 DIAGNOSIS — M542 Cervicalgia: Secondary | ICD-10-CM | POA: Diagnosis not present

## 2022-05-15 DIAGNOSIS — M546 Pain in thoracic spine: Secondary | ICD-10-CM | POA: Diagnosis not present

## 2022-05-15 DIAGNOSIS — M545 Low back pain, unspecified: Secondary | ICD-10-CM | POA: Diagnosis not present

## 2022-05-18 DIAGNOSIS — D259 Leiomyoma of uterus, unspecified: Secondary | ICD-10-CM | POA: Diagnosis not present

## 2022-06-02 NOTE — Therapy (Signed)
OUTPATIENT PHYSICAL THERAPY THORACOLUMBAR EVALUATION   Patient Name: Carolyn Cole MRN: [id number] DOB:[birth year],32 y.o., female Today's Date: 06/03/2022   END OF SESSION:  PT End of Session - 06/03/22 1102     Visit Number 1    Number of Visits 17    Date for PT Re-Evaluation 08/01/22    Authorization Type [employer]    PT Start Time 1102    PT Stop Time 1145    PT Time Calculation (min) 43 min    Activity Tolerance Patient tolerated treatment well    Behavior During Therapy WFL for tasks assessed/performed              Past Medical History:  Diagnosis Date   Anemia    Fibroids    Papilledema, both eyes    Vertigo    Past Surgical History:  Procedure Laterality Date   APPENDECTOMY     Patient Active Problem List   Diagnosis Date Noted   Preeclampsia in postpartum period 02/08/2021   Encounter for induction of labor 01/28/2021   BMI 36.0-36.9,adult 01/27/2021   Fibroids 06/04/2018   Asthma 05/18/2018   Intramural leiomyoma of uterus 05/18/2018   Personal history of MRSA (methicillin resistant Staphylococcus aureus) 05/18/2018    PCP: Marrianne Mood, FNP  REFERRING PROVIDER: Phylliss Bob, MD   REFERRING DIAG: Neck, Thoracic , Low back pain   Rationale for Evaluation and Treatment: Rehabilitation  THERAPY DIAG:  Other low back pain  Pain in thoracic spine  Muscle weakness (generalized)  Abnormal posture  ONSET DATE: chronic   SUBJECTIVE:                                                                                                                                                                                           SUBJECTIVE STATEMENT: Patient reports back pain on and off for years, but recently (over the past month) has been having more sharp and shooting making it hard to sit or stand for longer periods. She thinks her recent worsening of pain could be attributed to increasing her workout regimen as she started using a stepper machine and  working out about 30 minutes a day. She was concerned that she had scoliosis because her mom was diagnosed with scoliosis. She had an X-ray that did show a 20 degree curvature, but was told that it was not bad enough to require surgical intervention. She also spoke with MD about a breast reduction and would like this to be a last resort for pain management. She knows that she has bad posture and even when she tries to sit up straight this causes more pain along  the back. She reports the pain has worsened over the past month and can only tolerate sitting or standing for about 10 minutes before needing to change positions. She denies any changes in bowel/bladder. No numbness/tingling.   PERTINENT HISTORY:  Vertigo  Fibroids   PAIN:  Are you having pain? Yes: NPRS scale: 4 (at worst 8)/10 Pain location: mid/lower back Pain description: sharp,shooting,strain Aggravating factors: prolonged sitting, standing, washing dishes, reading baby books, driving Relieving factors: hot shower  PRECAUTIONS: None  WEIGHT BEARING RESTRICTIONS: No  FALLS:  Has patient fallen in last 6 months? No  LIVING ENVIRONMENT: Lives with: lives with their family Lives in: House/apartment Stairs: No Has following equipment at home: None  OCCUPATION: unemployed   PLOF: Independent  PATIENT GOALS: "have better posture and feel comfortable sitting and standing."    OBJECTIVE:   DIAGNOSTIC FINDINGS:  Thoracic X-ray IMPRESSION: Scoliotic curvature of the midthoracic spine, apex to the right with a Cobb angle of 20 degrees.  Lumbar X-ray: IMPRESSION: 1. Increased sclerosis in L5 and S1 near midline is nonspecific. Recommend a bone scan for further evaluation. If the bone scan is negative, no further follow-up is necessary. If the bone scan is positive, CT or MRI could further assess. 2. No other abnormalities.  PATIENT SURVEYS:  Modified Oswestry 24% disability; 12/50   SCREENING FOR RED FLAGS: Bowel or  bladder incontinence: No Spinal tumors: No Cauda equina syndrome: No Compression fracture: No Abdominal aneurysm: No  COGNITION: Overall cognitive status: Within functional limits for tasks assessed     SENSATION: Not tested  MUSCLE LENGTH: Hamstrings: WNL bilaterally    POSTURE: rounded shoulders, forward head, and decreased lumbar lordosis  PALPATION: Pain and hypomobility T-spine and L-spine TTP bilateral thoracolumbar paraspinals   LUMBAR ROM:   AROM eval  Flexion WNL  Extension 75% limited*  Right lateral flexion WNL  Left lateral flexion WNL*  Right rotation WNL  Left rotation WNL   (Blank rows = not tested) * pain   MMT:    MMT Right eval Left eval  Hip flexion 5 5  Hip extension 4 LBP pan 4 LBP pain  Hip abduction 4 4  Middle trap 4- 4-  Hip internal rotation    Hip external rotation    Knee flexion    Knee extension    Ankle dorsiflexion    Ankle plantarflexion    Ankle inversion    Ankle eversion     (Blank rows = not tested)  SPECIAL TESTS:  (-) Adams forward bend (+) ely  (+) thomas for hip flexor and quad bilateral   FUNCTIONAL TESTS:  Plank 5 sec   GAIT: Distance walked: 10 ft  Assistive device utilized: None Level of assistance: Complete Independence Comments: WNL  [facility] Adult PT Treatment:                                                DATE: 06/03/22  Therapeutic Exercise: Demonstrated and issued initial HEP.    Therapeutic Activity: Education on assessment findings that will be addressed throughout duration of POC.       PATIENT EDUCATION:  Education details: see treatment Person educated: Patient Education method: Explanation, Demonstration, Tactile cues, Verbal cues, and Handouts Education comprehension: verbalized understanding, returned demonstration, verbal cues required, tactile cues required, and needs further education  HOME EXERCISE PROGRAM: Access Code: [email or web address] URL:  [email or web address] Date: 06/03/2022 Prepared by: Gwendolyn Grant  Exercises - Doorway Pec Stretch at 90 Degrees Abduction  - 1 x daily - 7 x weekly - 3 sets - 30 sec  hold - Sidelying Thoracic Rotation with Open Book  - 1 x daily - 7 x weekly - 1 sets - 10 reps - Cat Cow  - 1 x daily - 7 x weekly - 1 sets - 10 reps - Supine Quadriceps Stretch with Strap on Table  - 1 x daily - 7 x weekly - 3 sets - 30 sec  hold - Seated Thoracic Lumbar Extension  - 1 x daily - 7 x weekly - 1 sets - 10 reps  ASSESSMENT:  CLINICAL IMPRESSION: Patient is a 32 y.o. female who was seen today for physical therapy evaluation and treatment for chronic back pain with recent worsening over the past month that she attributes to increasing her workout regimen. Her recent thoracic imaging does show a 20 degree scoliotic curvature. Upon assessment she is noted to have painful and limited trunk extension AROM, postural abnormalities, hip/core and periscapular weakness, hypomobility about the thoracic and lumbar spine, and activity limitations secondary to pain. She will benefit from skilled PT to address the above stated deficits in order to optimize her function and assist in overall pain reduction.   OBJECTIVE IMPAIRMENTS: decreased activity tolerance, decreased endurance, decreased knowledge of condition, difficulty walking, decreased ROM, decreased strength, hypomobility, increased fascial restrictions, impaired flexibility, postural dysfunction, and pain.   ACTIVITY LIMITATIONS: carrying, lifting, bending, sitting, standing, squatting, locomotion level, and caring for others  PARTICIPATION LIMITATIONS: meal prep, cleaning, laundry, driving, shopping, and community activity  PERSONAL FACTORS: Fitness and Time since onset of injury/illness/exacerbation are also affecting patient's functional outcome.   REHAB POTENTIAL: Good  CLINICAL DECISION MAKING: Stable/uncomplicated  EVALUATION COMPLEXITY:  Low   GOALS: Goals reviewed with patient? Yes  SHORT TERM GOALS: Target date: 07/01/2022    Patient will be independent and compliant with initial HEP.   Baseline: see above Goal status: INITIAL  2.  Patient will improve lumbar extension AROM by at least 25% to improve ability to complete reaching activity.   Baseline: see above  Goal status: INITIAL  3.  Patient will maintain plank for at least 15 seconds indicative of improved lumbopelvic stability.  Baseline: see above  Goal status: INITIAL  4. Patient will demonstrate knowledge and application of appropriate sitting posture to reduce stress on her back.   Baseline: see above  Goal status: initial    LONG TERM GOALS: Target date: 07/29/2022    Patient will demonstrate 4+/5 bilateral middle trap strength to improve postural stability.  Baseline: see above Goal status: INITIAL  2.  Patient will demonstrate 5/5 bilateral hip abductor/extensor strength to improve stability about the chain with prolonged walking/standing activity.  Baseline: see above Goal status: INITIAL  3.  Patient will self-report tolerating at least 30 minutes of sitting and standing activity to improve her tolerance to playing with her daughter and completing household tasks.  Baseline: see above Goal status: INITIAL  4.  Patient will report pain at worst rated as </=4/10 to reduce her current functional limitations.  Baseline: see above Goal status: INITIAL  5.  Patient will be independent with advanced home program to assist in management of her chronic condition.   Baseline: n/a Goal status: INITIAL   PLAN:  PT FREQUENCY: 2x/week  PT DURATION: 6-8 weeks   PLANNED INTERVENTIONS: Therapeutic exercises, Therapeutic activity, Neuromuscular re-education, Balance training,  Gait training, Patient/Family education, Self Care, Dry Needling, Electrical stimulation, Spinal manipulation, Spinal mobilization, Cryotherapy, Moist heat, Manual  therapy, and Re-evaluation.  PLAN FOR NEXT SESSION: review and progress HEP prn; spinal mobility, work into trunk extension as tolerated, core/hip strengthening, postural strengthening.   Gwendolyn Grant, PT, DPT, ATC 06/03/22 12:02 PM

## 2022-06-03 ENCOUNTER — Ambulatory Visit: Payer: Medicaid Other | Attending: Orthopedic Surgery

## 2022-06-03 ENCOUNTER — Other Ambulatory Visit: Payer: Self-pay

## 2022-06-03 DIAGNOSIS — R293 Abnormal posture: Secondary | ICD-10-CM

## 2022-06-03 DIAGNOSIS — M5459 Other low back pain: Secondary | ICD-10-CM | POA: Diagnosis present

## 2022-06-03 DIAGNOSIS — M6281 Muscle weakness (generalized): Secondary | ICD-10-CM

## 2022-06-03 DIAGNOSIS — M546 Pain in thoracic spine: Secondary | ICD-10-CM | POA: Diagnosis present

## 2022-06-04 DIAGNOSIS — F4323 Adjustment disorder with mixed anxiety and depressed mood: Secondary | ICD-10-CM | POA: Diagnosis not present

## 2022-06-16 ENCOUNTER — Ambulatory Visit: Payer: Medicaid Other | Attending: Orthopedic Surgery

## 2022-06-16 DIAGNOSIS — R293 Abnormal posture: Secondary | ICD-10-CM | POA: Insufficient documentation

## 2022-06-16 DIAGNOSIS — M546 Pain in thoracic spine: Secondary | ICD-10-CM | POA: Insufficient documentation

## 2022-06-16 DIAGNOSIS — M6281 Muscle weakness (generalized): Secondary | ICD-10-CM | POA: Diagnosis present

## 2022-06-16 DIAGNOSIS — M5459 Other low back pain: Secondary | ICD-10-CM | POA: Insufficient documentation

## 2022-06-16 NOTE — Therapy (Signed)
OUTPATIENT PHYSICAL THERAPY TREATMENT NOTE   Patient Name: Carolyn Cole MRN: [id number] DOB:06-22-1990, 32 y.o., female Today's Date: 06/16/2022  PCP: Marrianne Mood, FNP  REFERRING PROVIDER: Phylliss Bob, MD    END OF SESSION:   PT End of Session - 06/16/22 1146     Visit Number 2    Number of Visits 17    Date for PT Re-Evaluation 08/01/22    Authorization Type [employer]    PT Start Time 1146    PT Stop Time 1230    PT Time Calculation (min) 44 min    Activity Tolerance Patient tolerated treatment well    Behavior During Therapy WFL for tasks assessed/performed             Past Medical History:  Diagnosis Date   Anemia    Fibroids    Papilledema, both eyes    Vertigo    Past Surgical History:  Procedure Laterality Date   APPENDECTOMY     Patient Active Problem List   Diagnosis Date Noted   Preeclampsia in postpartum period 02/08/2021   Encounter for induction of labor 01/28/2021   BMI 36.0-36.9,adult 01/27/2021   Fibroids 06/04/2018   Asthma 05/18/2018   Intramural leiomyoma of uterus 05/18/2018   Personal history of MRSA (methicillin resistant Staphylococcus aureus) 05/18/2018    REFERRING DIAG:  Neck, Thoracic , Low back pain    THERAPY DIAG:  Other low back pain  Pain in thoracic spine  Muscle weakness (generalized)  Abnormal posture  Rationale for Evaluation and Treatment Rehabilitation  PERTINENT HISTORY: vertigo, fibroids  PRECAUTIONS: none   SUBJECTIVE:                                                                                                                                                                                      SUBJECTIVE STATEMENT:  Patient reports she has only completed a couple of her exercises since the initial evaluation. Her pain isn't "too bad" right now.    PAIN:  Are you having pain? Yes: NPRS scale: 4/10 Pain location: Lt mid/low back Pain description: ache,sore Aggravating factors:  sitting,standing,washing dishes,reading baby books, driving Relieving factors: hot shower   OBJECTIVE: (objective measures completed at initial evaluation unless otherwise dated) DIAGNOSTIC FINDINGS:  Thoracic X-ray IMPRESSION: Scoliotic curvature of the midthoracic spine, apex to the right with a Cobb angle of 20 degrees.   Lumbar X-ray: IMPRESSION: 1. Increased sclerosis in L5 and S1 near midline is nonspecific. Recommend a bone scan for further evaluation. If the bone scan is negative, no further follow-up is necessary. If the bone scan is positive, CT or MRI could further assess. 2. No other abnormalities.  PATIENT SURVEYS:  Modified Oswestry 24% disability; 12/50    SCREENING FOR RED FLAGS: Bowel or bladder incontinence: No Spinal tumors: No Cauda equina syndrome: No Compression fracture: No Abdominal aneurysm: No   COGNITION: Overall cognitive status: Within functional limits for tasks assessed                          SENSATION: Not tested   MUSCLE LENGTH: Hamstrings: WNL bilaterally      POSTURE: rounded shoulders, forward head, and decreased lumbar lordosis   PALPATION: Pain and hypomobility T-spine and L-spine TTP bilateral thoracolumbar paraspinals    LUMBAR ROM:    AROM eval 06/16/22  Flexion WNL   Extension 75% limited* 50% limited pain    Right lateral flexion WNL   Left lateral flexion WNL*   Right rotation WNL   Left rotation WNL    (Blank rows = not tested) * pain     MMT:     MMT Right eval Left eval  Hip flexion 5 5  Hip extension 4 LBP pan 4 LBP pain  Hip abduction 4 4  Middle trap 4- 4-  Hip internal rotation      Hip external rotation      Knee flexion      Knee extension      Ankle dorsiflexion      Ankle plantarflexion      Ankle inversion      Ankle eversion       (Blank rows = not tested)   SPECIAL TESTS:  (-) Adams forward bend (+) ely  (+) thomas for hip flexor and quad bilateral    FUNCTIONAL TESTS:  Plank 5  sec    GAIT: Distance walked: 10 ft  Assistive device utilized: None Level of assistance: Complete Independence Comments: WNL [facility] Adult PT Treatment:                                                DATE: 06/16/22 Therapeutic Exercise: Elliptical level 3, grade 5 x 5 minutes Sidelying thoracic rotation x 10 each Cat/cow x 10  Quad stretch with strap x 60 seconds each Seated thoracic extension x 10  Pec doorway stretch 2 x 30 sec  Supine posterior pelvic tilts x 10; 5 sec hold  Hip bridge 2 x 10  Supine resisted horizontal shoulder abduction red band 2 x 10  Prone opposite arm/leg extension x 10  Bilateral shoulder ER red band d/c due to pain    [facility] Adult PT Treatment:                                                DATE: 06/03/22   Therapeutic Exercise: Demonstrated and issued initial HEP.      Therapeutic Activity: Education on assessment findings that will be addressed throughout duration of POC.            PATIENT EDUCATION:  Education details: HEP review  Person educated: Patient Education method: Explanation, Demonstration, Corporate treasurer cues, Verbal cues Education comprehension: verbalized understanding, returned demonstration, verbal cues required, tactile cues required, and needs further education   HOME EXERCISE PROGRAM: Access Code: [email or web address] URL: [email or web address] Date: 06/03/2022 Prepared by: Gwendolyn Grant  Exercises - Doorway Pec Stretch at 90 Degrees Abduction  - 1 x daily - 7 x weekly - 3 sets - 30 sec  hold - Sidelying Thoracic Rotation with Open Book  - 1 x daily - 7 x weekly - 1 sets - 10 reps - Cat Cow  - 1 x daily - 7 x weekly - 1 sets - 10 reps - Supine Quadriceps Stretch with Strap on Table  - 1 x daily - 7 x weekly - 3 sets - 30 sec  hold - Seated Thoracic Lumbar Extension  - 1 x daily - 7 x weekly - 1 sets - 10 reps   ASSESSMENT:   CLINICAL IMPRESSION: Patient arrives with mild back pain. She reports only completing a few of her  exercises that were prescribed at initial evaluation, so we spent time reviewing all of her HEP. She requires cues for setup of majority of these exercises. Able to begin posterior chain and core strengthening with good tolerance with exception of sitting exercises as she reported pain about Rt thoracic region when she attempts bilateral shoulder ER. Her lumbar extension AROM has improved compared to baseline, but she continues to have pain at end range. No changes made to HEP at this time with patient encouraged to adhere to initially prescribed exercises.    OBJECTIVE IMPAIRMENTS: decreased activity tolerance, decreased endurance, decreased knowledge of condition, difficulty walking, decreased ROM, decreased strength, hypomobility, increased fascial restrictions, impaired flexibility, postural dysfunction, and pain.    ACTIVITY LIMITATIONS: carrying, lifting, bending, sitting, standing, squatting, locomotion level, and caring for others   PARTICIPATION LIMITATIONS: meal prep, cleaning, laundry, driving, shopping, and community activity   PERSONAL FACTORS: Fitness and Time since onset of injury/illness/exacerbation are also affecting patient's functional outcome.    REHAB POTENTIAL: Good   CLINICAL DECISION MAKING: Stable/uncomplicated   EVALUATION COMPLEXITY: Low     GOALS: Goals reviewed with patient? Yes   SHORT TERM GOALS: Target date: 07/01/2022       Patient will be independent and compliant with initial HEP.    Baseline: see above Goal status: INITIAL   2.  Patient will improve lumbar extension AROM by at least 25% to improve ability to complete reaching activity.   Baseline: see above  Goal status: INITIAL   3.  Patient will maintain plank for at least 15 seconds indicative of improved lumbopelvic stability.  Baseline: see above  Goal status: INITIAL   4. Patient will demonstrate knowledge and application of appropriate sitting posture to reduce stress on her back.               Baseline: see above             Goal status: initial      LONG TERM GOALS: Target date: 07/29/2022       Patient will demonstrate 4+/5 bilateral middle trap strength to improve postural stability.  Baseline: see above Goal status: INITIAL   2.  Patient will demonstrate 5/5 bilateral hip abductor/extensor strength to improve stability about the chain with prolonged walking/standing activity.  Baseline: see above Goal status: INITIAL   3.  Patient will self-report tolerating at least 30 minutes of sitting and standing activity to improve her tolerance to playing with her daughter and completing household tasks.  Baseline: see above Goal status: INITIAL   4.  Patient will report pain at worst rated as </=4/10 to reduce her current functional limitations.  Baseline: see above Goal status: INITIAL   5.  Patient  will be independent with advanced home program to assist in management of her chronic condition.    Baseline: n/a Goal status: INITIAL     PLAN:   PT FREQUENCY: 2x/week   PT DURATION: 6-8 weeks    PLANNED INTERVENTIONS: Therapeutic exercises, Therapeutic activity, Neuromuscular re-education, Balance training, Gait training, Patient/Family education, Self Care, Dry Needling, Electrical stimulation, Spinal manipulation, Spinal mobilization, Cryotherapy, Moist heat, Manual therapy, and Re-evaluation.   PLAN FOR NEXT SESSION: review and progress HEP prn; spinal mobility, work into trunk extension as tolerated, core/hip strengthening, postural strengthening.   Gwendolyn Grant, PT, DPT, ATC 06/16/22 12:33 PM

## 2022-06-19 ENCOUNTER — Ambulatory Visit: Payer: Medicaid Other | Admitting: Physical Therapy

## 2022-06-23 ENCOUNTER — Ambulatory Visit: Payer: Medicaid Other | Admitting: Physical Therapy

## 2022-06-25 ENCOUNTER — Encounter: Payer: Medicaid Other | Admitting: Obstetrics and Gynecology

## 2022-06-26 ENCOUNTER — Ambulatory Visit: Payer: Medicaid Other

## 2022-06-26 DIAGNOSIS — M5459 Other low back pain: Secondary | ICD-10-CM

## 2022-06-26 DIAGNOSIS — R293 Abnormal posture: Secondary | ICD-10-CM

## 2022-06-26 DIAGNOSIS — F4323 Adjustment disorder with mixed anxiety and depressed mood: Secondary | ICD-10-CM | POA: Diagnosis not present

## 2022-06-26 DIAGNOSIS — M546 Pain in thoracic spine: Secondary | ICD-10-CM

## 2022-06-26 DIAGNOSIS — M6281 Muscle weakness (generalized): Secondary | ICD-10-CM

## 2022-06-26 NOTE — Patient Instructions (Signed)

## 2022-06-26 NOTE — Therapy (Signed)
OUTPATIENT PHYSICAL THERAPY TREATMENT NOTE   Patient Name: Carolyn Cole MRN: [id number] DOB:15-Jan-1991, 32 y.o., female Today's Date: 06/26/2022  PCP: Marrianne Mood, FNP  REFERRING PROVIDER: Phylliss Bob, MD    END OF SESSION:   PT End of Session - 06/26/22 1148     Visit Number 3    Number of Visits 17    Date for PT Re-Evaluation 08/01/22    Authorization Type [employer]    PT Start Time 1148    PT Stop Time 1230    PT Time Calculation (min) 42 min    Activity Tolerance Patient tolerated treatment well    Behavior During Therapy WFL for tasks assessed/performed             Past Medical History:  Diagnosis Date   Anemia    Fibroids    Papilledema, both eyes    Vertigo    Past Surgical History:  Procedure Laterality Date   APPENDECTOMY     Patient Active Problem List   Diagnosis Date Noted   Preeclampsia in postpartum period 02/08/2021   Encounter for induction of labor 01/28/2021   BMI 36.0-36.9,adult 01/27/2021   Fibroids 06/04/2018   Asthma 05/18/2018   Intramural leiomyoma of uterus 05/18/2018   Personal history of MRSA (methicillin resistant Staphylococcus aureus) 05/18/2018    REFERRING DIAG:  Neck, Thoracic , Low back pain    THERAPY DIAG:  Other low back pain  Pain in thoracic spine  Muscle weakness (generalized)  Abnormal posture  Rationale for Evaluation and Treatment Rehabilitation  PERTINENT HISTORY: vertigo, fibroids  PRECAUTIONS: none   SUBJECTIVE:                                                                                                                                                                                      SUBJECTIVE STATEMENT: Patient reports she is feeling a little bit better. She has been completing her exercises.   PAIN:  Are you having pain? Yes: NPRS scale: 3/10 Pain location: Lt mid/low back Pain description: ache,sore Aggravating factors: sitting,standing,washing dishes,reading baby books,  driving Relieving factors: hot shower   OBJECTIVE: (objective measures completed at initial evaluation unless otherwise dated) DIAGNOSTIC FINDINGS:  Thoracic X-ray IMPRESSION: Scoliotic curvature of the midthoracic spine, apex to the right with a Cobb angle of 20 degrees.   Lumbar X-ray: IMPRESSION: 1. Increased sclerosis in L5 and S1 near midline is nonspecific. Recommend a bone scan for further evaluation. If the bone scan is negative, no further follow-up is necessary. If the bone scan is positive, CT or MRI could further assess. 2. No other abnormalities.   PATIENT SURVEYS:  Modified Oswestry 24% disability;  12/50    SCREENING FOR RED FLAGS: Bowel or bladder incontinence: No Spinal tumors: No Cauda equina syndrome: No Compression fracture: No Abdominal aneurysm: No   COGNITION: Overall cognitive status: Within functional limits for tasks assessed                          SENSATION: Not tested   MUSCLE LENGTH: Hamstrings: WNL bilaterally      POSTURE: rounded shoulders, forward head, and decreased lumbar lordosis   PALPATION: Pain and hypomobility T-spine and L-spine TTP bilateral thoracolumbar paraspinals    LUMBAR ROM:    AROM eval 06/16/22 06/26/22  Flexion WNL  Full   Extension 75% limited* 50% limited pain   25% limited pain  Right lateral flexion WNL  WNL "pulling"  Left lateral flexion WNL*  WNL "pulling"  Right rotation WNL  WNL  Left rotation WNL  WNL   (Blank rows = not tested) * pain     MMT:     MMT Right eval Left eval  Hip flexion 5 5  Hip extension 4 LBP pan 4 LBP pain  Hip abduction 4 4  Middle trap 4- 4-  Hip internal rotation      Hip external rotation      Knee flexion      Knee extension      Ankle dorsiflexion      Ankle plantarflexion      Ankle inversion      Ankle eversion       (Blank rows = not tested)   SPECIAL TESTS:  (-) Adams forward bend (+) ely  (+) thomas for hip flexor and quad bilateral    FUNCTIONAL  TESTS:  Plank 5 sec    GAIT: Distance walked: 10 ft  Assistive device utilized: None Level of assistance: Complete Independence Comments: WNL [facility] Adult PT Treatment:                                                DATE: 06/26/22 Therapeutic Exercise: Elliptical level 3, grade 5 x 5 minutes LTR with figure 4 x 1 minute each Supine posterior pelvic tilts x 10; 5 sec hold  Supine TA march 2 x 10  SLR with posterior pelvic tilt x 10 each  Supine hip flexor/quad stretch x 30 sec  Updated HEP  Manual Therapy: STM bilateral lumbar paraspinals, QL    [facility] Adult PT Treatment:                                                DATE: 06/16/22 Therapeutic Exercise: Elliptical level 3, grade 5 x 5 minutes Sidelying thoracic rotation x 10 each Cat/cow x 10  Quad stretch with strap x 60 seconds each Seated thoracic extension x 10  Pec doorway stretch 2 x 30 sec  Supine posterior pelvic tilts x 10; 5 sec hold  Hip bridge 2 x 10  Supine resisted horizontal shoulder abduction red band 2 x 10  Prone opposite arm/leg extension x 10  Bilateral shoulder ER red band d/c due to pain    [facility] Adult PT Treatment:  DATE: 06/03/22   Therapeutic Exercise: Demonstrated and issued initial HEP.      Therapeutic Activity: Education on assessment findings that will be addressed throughout duration of POC.            PATIENT EDUCATION:  Education details: HEP update;TPDN  Person educated: Patient Education method: Explanation, Demonstration, Tactile cues, Verbal cues, handout  Education comprehension: verbalized understanding, returned demonstration, verbal cues required, tactile cues required, and needs further education   HOME EXERCISE PROGRAM: Access Code: [email or web address] URL: [email or web address] Date: 06/03/2022 Prepared by: Gwendolyn Grant   Exercises - Doorway Pec Stretch at 90 Degrees Abduction  - 1 x daily - 7 x weekly - 3 sets - 30 sec   hold - Sidelying Thoracic Rotation with Open Book  - 1 x daily - 7 x weekly - 1 sets - 10 reps - Cat Cow  - 1 x daily - 7 x weekly - 1 sets - 10 reps - Supine Quadriceps Stretch with Strap on Table  - 1 x daily - 7 x weekly - 3 sets - 30 sec  hold - Seated Thoracic Lumbar Extension  - 1 x daily - 7 x weekly - 1 sets - 10 reps   ASSESSMENT:   CLINICAL IMPRESSION: Patient arrives with mild back pain. Her lumbar extension AROM continues to improve, but remains painful. She is noted to have pain and hypomobility with lumbar PAIVM. We discussed potential benefit of TPDN to lumbar musculature with educational handouts provided today as patient would like to hold off on this intervention today. Able to progress core stabilization with patient having difficulty maintaining neutral spine with supine dynamic core activity.    OBJECTIVE IMPAIRMENTS: decreased activity tolerance, decreased endurance, decreased knowledge of condition, difficulty walking, decreased ROM, decreased strength, hypomobility, increased fascial restrictions, impaired flexibility, postural dysfunction, and pain.    ACTIVITY LIMITATIONS: carrying, lifting, bending, sitting, standing, squatting, locomotion level, and caring for others   PARTICIPATION LIMITATIONS: meal prep, cleaning, laundry, driving, shopping, and community activity   PERSONAL FACTORS: Fitness and Time since onset of injury/illness/exacerbation are also affecting patient's functional outcome.    REHAB POTENTIAL: Good   CLINICAL DECISION MAKING: Stable/uncomplicated   EVALUATION COMPLEXITY: Low     GOALS: Goals reviewed with patient? Yes   SHORT TERM GOALS: Target date: 07/01/2022       Patient will be independent and compliant with initial HEP.    Baseline: see above Goal status: INITIAL   2.  Patient will improve lumbar extension AROM by at least 25% to improve ability to complete reaching activity.   Baseline: see above  Goal status: met   3.   Patient will maintain plank for at least 15 seconds indicative of improved lumbopelvic stability.  Baseline: see above  Goal status: INITIAL   4. Patient will demonstrate knowledge and application of appropriate sitting posture to reduce stress on her back.              Baseline: see above             Goal status: initial      LONG TERM GOALS: Target date: 07/29/2022       Patient will demonstrate 4+/5 bilateral middle trap strength to improve postural stability.  Baseline: see above Goal status: INITIAL   2.  Patient will demonstrate 5/5 bilateral hip abductor/extensor strength to improve stability about the chain with prolonged walking/standing activity.  Baseline: see above Goal status: INITIAL   3.  Patient will self-report tolerating  at least 30 minutes of sitting and standing activity to improve her tolerance to playing with her daughter and completing household tasks.  Baseline: see above Goal status: INITIAL   4.  Patient will report pain at worst rated as </=4/10 to reduce her current functional limitations.  Baseline: see above Goal status: INITIAL   5.  Patient will be independent with advanced home program to assist in management of her chronic condition.    Baseline: n/a Goal status: INITIAL     PLAN:   PT FREQUENCY: 2x/week   PT DURATION: 6-8 weeks    PLANNED INTERVENTIONS: Therapeutic exercises, Therapeutic activity, Neuromuscular re-education, Balance training, Gait training, Patient/Family education, Self Care, Dry Needling, Electrical stimulation, Spinal manipulation, Spinal mobilization, Cryotherapy, Moist heat, Manual therapy, and Re-evaluation.   PLAN FOR NEXT SESSION: review and progress HEP prn; spinal mobility, work into trunk extension as tolerated, core/hip strengthening, postural strengthening.   Gwendolyn Grant, PT, DPT, ATC 06/26/22 12:38 PM

## 2022-06-30 ENCOUNTER — Ambulatory Visit: Payer: Medicaid Other

## 2022-06-30 DIAGNOSIS — M6281 Muscle weakness (generalized): Secondary | ICD-10-CM

## 2022-06-30 DIAGNOSIS — M5459 Other low back pain: Secondary | ICD-10-CM

## 2022-06-30 DIAGNOSIS — M546 Pain in thoracic spine: Secondary | ICD-10-CM

## 2022-06-30 DIAGNOSIS — R293 Abnormal posture: Secondary | ICD-10-CM

## 2022-06-30 NOTE — Therapy (Signed)
OUTPATIENT PHYSICAL THERAPY TREATMENT NOTE   Patient Name: Carolyn Cole MRN: [id number] DOB:[birth year], 32 y.o., female Today's Date: 06/30/2022  PCP: Marrianne Mood, FNP  REFERRING PROVIDER: Phylliss Bob, MD    END OF SESSION:   PT End of Session - 06/30/22 1147     Visit Number 4    Number of Visits 17    Date for PT Re-Evaluation 08/01/22    Authorization Type [employer]    PT Start Time 1147    PT Stop Time 1230    PT Time Calculation (min) 43 min    Activity Tolerance Patient tolerated treatment well    Behavior During Therapy WFL for tasks assessed/performed             Past Medical History:  Diagnosis Date   Anemia    Fibroids    Papilledema, both eyes    Vertigo    Past Surgical History:  Procedure Laterality Date   APPENDECTOMY     Patient Active Problem List   Diagnosis Date Noted   Preeclampsia in postpartum period 02/08/2021   Encounter for induction of labor 01/28/2021   BMI 36.0-36.9,adult 01/27/2021   Fibroids 06/04/2018   Asthma 05/18/2018   Intramural leiomyoma of uterus 05/18/2018   Personal history of MRSA (methicillin resistant Staphylococcus aureus) 05/18/2018    REFERRING DIAG:  Neck, Thoracic , Low back pain    THERAPY DIAG:  Other low back pain  Pain in thoracic spine  Muscle weakness (generalized)  Abnormal posture  Rationale for Evaluation and Treatment Rehabilitation  PERTINENT HISTORY: vertigo, fibroids  PRECAUTIONS: none   SUBJECTIVE:                                                                                                                                                                                      SUBJECTIVE STATEMENT: Patient reports having pain when completing some of her exercises today. Specifically seated thoracic extension and supine marching.   PAIN:  Are you having pain? Yes: NPRS scale: 4/10 Pain location: mid/low back Pain description: ache,sore Aggravating factors:  sitting,standing,washing dishes,reading baby books, driving Relieving factors: hot shower   OBJECTIVE: (objective measures completed at initial evaluation unless otherwise dated) DIAGNOSTIC FINDINGS:  Thoracic X-ray IMPRESSION: Scoliotic curvature of the midthoracic spine, apex to the right with a Cobb angle of 20 degrees.   Lumbar X-ray: IMPRESSION: 1. Increased sclerosis in L5 and S1 near midline is nonspecific. Recommend a bone scan for further evaluation. If the bone scan is negative, no further follow-up is necessary. If the bone scan is positive, CT or MRI could further assess. 2. No other abnormalities.   PATIENT SURVEYS:  Modified Oswestry  24% disability; 12/50    SCREENING FOR RED FLAGS: Bowel or bladder incontinence: No Spinal tumors: No Cauda equina syndrome: No Compression fracture: No Abdominal aneurysm: No   COGNITION: Overall cognitive status: Within functional limits for tasks assessed                          SENSATION: Not tested   MUSCLE LENGTH: Hamstrings: WNL bilaterally      POSTURE: rounded shoulders, forward head, and decreased lumbar lordosis   PALPATION: Pain and hypomobility T-spine and L-spine TTP bilateral thoracolumbar paraspinals    LUMBAR ROM:    AROM eval 06/16/22 06/26/22  Flexion WNL  Full   Extension 75% limited* 50% limited pain   25% limited pain  Right lateral flexion WNL  WNL "pulling"  Left lateral flexion WNL*  WNL "pulling"  Right rotation WNL  WNL  Left rotation WNL  WNL   (Blank rows = not tested) * pain     MMT:     MMT Right eval Left eval  Hip flexion 5 5  Hip extension 4 LBP pan 4 LBP pain  Hip abduction 4 4  Middle trap 4- 4-  Hip internal rotation      Hip external rotation      Knee flexion      Knee extension      Ankle dorsiflexion      Ankle plantarflexion      Ankle inversion      Ankle eversion       (Blank rows = not tested)   SPECIAL TESTS:  (-) Adams forward bend (+) ely  (+) thomas  for hip flexor and quad bilateral    FUNCTIONAL TESTS:  Plank 5 sec   06/30/22: plank: 8 seconds  GAIT: Distance walked: 10 ft  Assistive device utilized: None Level of assistance: Complete Independence Comments: WNL [facility] Adult PT Treatment:                                                DATE: 06/30/22 Therapeutic Exercise: Elliptical level 4, grade 6 x 5 minutes  Seated thoracic extension x 5 Supine TA march 2 x 10  Supine bent knee fallout 2 x 10  Thread the needle x 10  Plank on elbows 3 trials to fatigue  Hip bridge with abduction blue band 2 x 10  Updated HEP    [facility] Adult PT Treatment:                                                DATE: 06/26/22 Therapeutic Exercise: Elliptical level 3, grade 5 x 5 minutes LTR with figure 4 x 1 minute each Supine posterior pelvic tilts x 10; 5 sec hold  Supine TA march 2 x 10  SLR with posterior pelvic tilt x 10 each  Supine hip flexor/quad stretch x 30 sec  Updated HEP  Manual Therapy: STM bilateral lumbar paraspinals, QL    [facility] Adult PT Treatment:  DATE: 06/16/22 Therapeutic Exercise: Elliptical level 3, grade 5 x 5 minutes Sidelying thoracic rotation x 10 each Cat/cow x 10  Quad stretch with strap x 60 seconds each Seated thoracic extension x 10  Pec doorway stretch 2 x 30 sec  Supine posterior pelvic tilts x 10; 5 sec hold  Hip bridge 2 x 10  Supine resisted horizontal shoulder abduction red band 2 x 10  Prone opposite arm/leg extension x 10  Bilateral shoulder ER red band d/c due to pain        PATIENT EDUCATION:  Education details: HEP update Person educated: Patient Education method: Explanation, Demonstration, Tactile cues, Verbal cues, handout  Education comprehension: verbalized understanding, returned demonstration, verbal cues required, tactile cues required, and needs further education   HOME EXERCISE PROGRAM: Access Code: [email or web address] URL:  [email or web address] Date: 06/03/2022 Prepared by: Gwendolyn Grant   Exercises - Doorway Pec Stretch at 90 Degrees Abduction  - 1 x daily - 7 x weekly - 3 sets - 30 sec  hold - Sidelying Thoracic Rotation with Open Book  - 1 x daily - 7 x weekly - 1 sets - 10 reps - Cat Cow  - 1 x daily - 7 x weekly - 1 sets - 10 reps - Supine Quadriceps Stretch with Strap on Table  - 1 x daily - 7 x weekly - 3 sets - 30 sec  hold - Seated Thoracic Lumbar Extension  - 1 x daily - 7 x weekly - 1 sets - 10 reps   ASSESSMENT:   CLINICAL IMPRESSION: Reviewed the HEP exercises that patient reports were painful when attempting at home today. With seated thoracic extension she feels that her chair at home is not the same height and this is likely the cause of increased back pain. This exercise was removed from HEP. With supine marching she requires heavy cues to maintain neutral spine and allow for breathing. When she is able to maintain core activation she does not experience back pain. Able to progress core/hip strengthening and trunk mobility with good tolerance, but patient quickly fatigues with majority of there ex today.    OBJECTIVE IMPAIRMENTS: decreased activity tolerance, decreased endurance, decreased knowledge of condition, difficulty walking, decreased ROM, decreased strength, hypomobility, increased fascial restrictions, impaired flexibility, postural dysfunction, and pain.    ACTIVITY LIMITATIONS: carrying, lifting, bending, sitting, standing, squatting, locomotion level, and caring for others   PARTICIPATION LIMITATIONS: meal prep, cleaning, laundry, driving, shopping, and community activity   PERSONAL FACTORS: Fitness and Time since onset of injury/illness/exacerbation are also affecting patient's functional outcome.    REHAB POTENTIAL: Good   CLINICAL DECISION MAKING: Stable/uncomplicated   EVALUATION COMPLEXITY: Low     GOALS: Goals reviewed with patient? Yes   SHORT TERM  GOALS: Target date: 07/01/2022       Patient will be independent and compliant with initial HEP.    Baseline: see above Goal status: met   2.  Patient will improve lumbar extension AROM by at least 25% to improve ability to complete reaching activity.   Baseline: see above  Goal status: met   3.  Patient will maintain plank for at least 15 seconds indicative of improved lumbopelvic stability.  Baseline: see above  Goal status: ongoing    4. Patient will demonstrate knowledge and application of appropriate sitting posture to reduce stress on her back.              Baseline: see above  06/30/22: continues to require postural cues  Goal status: ongoing       LONG TERM GOALS: Target date: 07/29/2022       Patient will demonstrate 4+/5 bilateral middle trap strength to improve postural stability.  Baseline: see above Goal status: INITIAL   2.  Patient will demonstrate 5/5 bilateral hip abductor/extensor strength to improve stability about the chain with prolonged walking/standing activity.  Baseline: see above Goal status: INITIAL   3.  Patient will self-report tolerating at least 30 minutes of sitting and standing activity to improve her tolerance to playing with her daughter and completing household tasks.  Baseline: see above Goal status: INITIAL   4.  Patient will report pain at worst rated as </=4/10 to reduce her current functional limitations.  Baseline: see above Goal status: INITIAL   5.  Patient will be independent with advanced home program to assist in management of her chronic condition.    Baseline: n/a Goal status: INITIAL     PLAN:   PT FREQUENCY: 2x/week   PT DURATION: 6-8 weeks    PLANNED INTERVENTIONS: Therapeutic exercises, Therapeutic activity, Neuromuscular re-education, Balance training, Gait training, Patient/Family education, Self Care, Dry Needling, Electrical stimulation, Spinal manipulation, Spinal mobilization, Cryotherapy, Moist  heat, Manual therapy, and Re-evaluation.   PLAN FOR NEXT SESSION: review and progress HEP prn; spinal mobility, work into trunk extension as tolerated, core/hip strengthening, postural strengthening.   Gwendolyn Grant, PT, DPT, ATC 06/30/22 12:30 PM

## 2022-07-03 ENCOUNTER — Encounter: Payer: Medicaid Other | Admitting: Physical Therapy

## 2022-07-03 DIAGNOSIS — F4323 Adjustment disorder with mixed anxiety and depressed mood: Secondary | ICD-10-CM | POA: Diagnosis not present

## 2022-07-07 ENCOUNTER — Ambulatory Visit: Payer: Medicaid Other

## 2022-07-07 DIAGNOSIS — R293 Abnormal posture: Secondary | ICD-10-CM

## 2022-07-07 DIAGNOSIS — M5459 Other low back pain: Secondary | ICD-10-CM | POA: Diagnosis not present

## 2022-07-07 DIAGNOSIS — M6281 Muscle weakness (generalized): Secondary | ICD-10-CM

## 2022-07-07 DIAGNOSIS — M546 Pain in thoracic spine: Secondary | ICD-10-CM

## 2022-07-07 NOTE — Therapy (Signed)
OUTPATIENT PHYSICAL THERAPY TREATMENT NOTE   Patient Name: Carolyn Cole MRN: [id number] DOB:[birth year], 32 y.o., female Today's Date: 07/07/2022  PCP: Marrianne Mood, FNP  REFERRING PROVIDER: Phylliss Bob, MD    END OF SESSION:   PT End of Session - 07/07/22 1149     Visit Number 5    Number of Visits 17    Date for PT Re-Evaluation 08/01/22    Authorization Type [employer]    PT Start Time 1148    PT Stop Time 1228    PT Time Calculation (min) 40 min    Activity Tolerance Patient tolerated treatment well    Behavior During Therapy WFL for tasks assessed/performed              Past Medical History:  Diagnosis Date   Anemia    Fibroids    Papilledema, both eyes    Vertigo    Past Surgical History:  Procedure Laterality Date   APPENDECTOMY     Patient Active Problem List   Diagnosis Date Noted   Preeclampsia in postpartum period 02/08/2021   Encounter for induction of labor 01/28/2021   BMI 36.0-36.9,adult 01/27/2021   Fibroids 06/04/2018   Asthma 05/18/2018   Intramural leiomyoma of uterus 05/18/2018   Personal history of MRSA (methicillin resistant Staphylococcus aureus) 05/18/2018    REFERRING DIAG:  Neck, Thoracic , Low back pain    THERAPY DIAG:  Other low back pain  Pain in thoracic spine  Muscle weakness (generalized)  Abnormal posture  Rationale for Evaluation and Treatment Rehabilitation  PERTINENT HISTORY: vertigo, fibroids  PRECAUTIONS: none   SUBJECTIVE:                                                                                                                                                                                      SUBJECTIVE STATEMENT: Patient reports her back is feeling bad. She has not been completing her exercises and has been doing more things around the house that have caused more pain. She was also doing bead work and was hunched over, which caused more pain.   PAIN:  Are you having pain? Yes: NPRS scale:  5/10 Pain location: mid/low back Pain description: ache,sore Aggravating factors: sitting,standing,washing dishes,reading baby books, driving Relieving factors: hot shower   OBJECTIVE: (objective measures completed at initial evaluation unless otherwise dated) DIAGNOSTIC FINDINGS:  Thoracic X-ray IMPRESSION: Scoliotic curvature of the midthoracic spine, apex to the right with a Cobb angle of 20 degrees.   Lumbar X-ray: IMPRESSION: 1. Increased sclerosis in L5 and S1 near midline is nonspecific. Recommend a bone scan for further evaluation. If the bone scan is negative, no further follow-up  is necessary. If the bone scan is positive, CT or MRI could further assess. 2. No other abnormalities.   PATIENT SURVEYS:  Modified Oswestry 24% disability; 12/50    SCREENING FOR RED FLAGS: Bowel or bladder incontinence: No Spinal tumors: No Cauda equina syndrome: No Compression fracture: No Abdominal aneurysm: No   COGNITION: Overall cognitive status: Within functional limits for tasks assessed                          SENSATION: Not tested   MUSCLE LENGTH: Hamstrings: WNL bilaterally      POSTURE: rounded shoulders, forward head, and decreased lumbar lordosis   PALPATION: Pain and hypomobility T-spine and L-spine TTP bilateral thoracolumbar paraspinals    LUMBAR ROM:    AROM eval 06/16/22 06/26/22  Flexion WNL  Full   Extension 75% limited* 50% limited pain   25% limited pain  Right lateral flexion WNL  WNL "pulling"  Left lateral flexion WNL*  WNL "pulling"  Right rotation WNL  WNL  Left rotation WNL  WNL   (Blank rows = not tested) * pain     MMT:     MMT Right eval Left eval  Hip flexion 5 5  Hip extension 4 LBP pan 4 LBP pain  Hip abduction 4 4  Middle trap 4- 4-  Hip internal rotation      Hip external rotation      Knee flexion      Knee extension      Ankle dorsiflexion      Ankle plantarflexion      Ankle inversion      Ankle eversion       (Blank  rows = not tested)   SPECIAL TESTS:  (-) Adams forward bend (+) ely  (+) thomas for hip flexor and quad bilateral    FUNCTIONAL TESTS:  Plank 5 sec   06/30/22: plank: 8 seconds  GAIT: Distance walked: 10 ft  Assistive device utilized: None Level of assistance: Complete Independence Comments: WNL  [facility] Adult PT Treatment:                                                DATE: 07/07/22 Therapeutic Exercise: Elliptical level 4, grade 6 x 5 minutes Supine posterior pelvics x 10  Supine TA march x 10  SLR with posterior pelvic tilts 2 x 10  Attempted 90/90 hold unable Seated TA march 2 x 10   Self Care: Education on energy conservation, posture, lifting mechanics with handouts provided.  Access Code: [email or web address] URL: [email or web address] Date: 07/07/2022 Prepared by: Gwendolyn Grant  Patient Education - Understanding Energy Conservation - Household Activities - Housekeeping - Marine scientist - Office Posture   [facility] Adult PT Treatment:                                                DATE: 06/30/22 Therapeutic Exercise: Elliptical level 4, grade 6 x 5 minutes  Seated thoracic extension x 5 Supine TA march 2 x 10  Supine bent knee fallout 2 x 10  Thread the needle x 10  Plank on elbows 3 trials to fatigue  Hip bridge with abduction blue band 2 x 10  Updated HEP    [facility] Adult PT Treatment:                                                DATE: 06/26/22 Therapeutic Exercise: Elliptical level 3, grade 5 x 5 minutes LTR with figure 4 x 1 minute each Supine posterior pelvic tilts x 10; 5 sec hold  Supine TA march 2 x 10  SLR with posterior pelvic tilt x 10 each  Supine hip flexor/quad stretch x 30 sec  Updated HEP  Manual Therapy: STM bilateral lumbar paraspinals, QL    [facility] Adult PT Treatment:                                                DATE: 06/16/22 Therapeutic Exercise: Elliptical level 3, grade 5 x 5 minutes Sidelying thoracic rotation x 10  each Cat/cow x 10  Quad stretch with strap x 60 seconds each Seated thoracic extension x 10  Pec doorway stretch 2 x 30 sec  Supine posterior pelvic tilts x 10; 5 sec hold  Hip bridge 2 x 10  Supine resisted horizontal shoulder abduction red band 2 x 10  Prone opposite arm/leg extension x 10  Bilateral shoulder ER red band d/c due to pain        PATIENT EDUCATION:  Education details: see treatment  Person educated: Patient Education method: Explanation, Demonstration, handout  Education comprehension: verbalized understanding   HOME EXERCISE PROGRAM: Access Code: [email or web address] URL: [email or web address] Date: 06/03/2022 Prepared by: Gwendolyn Grant   Exercises - Doorway Pec Stretch at 90 Degrees Abduction  - 1 x daily - 7 x weekly - 3 sets - 30 sec  hold - Sidelying Thoracic Rotation with Open Book  - 1 x daily - 7 x weekly - 1 sets - 10 reps - Cat Cow  - 1 x daily - 7 x weekly - 1 sets - 10 reps - Supine Quadriceps Stretch with Strap on Table  - 1 x daily - 7 x weekly - 3 sets - 30 sec  hold - Seated Thoracic Lumbar Extension  - 1 x daily - 7 x weekly - 1 sets - 10 reps   ASSESSMENT:   CLINICAL IMPRESSION: Patient arrives with an increase in back pain attributed it to overdoing it with household activity, maintaining poor posture, and not completing her exercises. Time spent discussing energy conservation, proper posture, and body mechanics with household activities with handouts provided. Continued with core stabilization with patient having significant difficulty maintaining TA activation with inability to progress to 90/90 strengthening due to weakness. She reported light-headedness towards the end of session that was relieved with water and a peppermint as patient reports she had not had anything to eat or drink yet today. No changes made to HEP at this time.    OBJECTIVE IMPAIRMENTS: decreased activity tolerance, decreased endurance, decreased knowledge of condition,  difficulty walking, decreased ROM, decreased strength, hypomobility, increased fascial restrictions, impaired flexibility, postural dysfunction, and pain.    ACTIVITY LIMITATIONS: carrying, lifting, bending, sitting, standing, squatting, locomotion level, and caring for others   PARTICIPATION LIMITATIONS: meal prep, cleaning, laundry, driving, shopping, and community activity   PERSONAL FACTORS: Fitness and Time since onset of injury/illness/exacerbation are also  affecting patient's functional outcome.    REHAB POTENTIAL: Good   CLINICAL DECISION MAKING: Stable/uncomplicated   EVALUATION COMPLEXITY: Low     GOALS: Goals reviewed with patient? Yes   SHORT TERM GOALS: Target date: 07/01/2022       Patient will be independent and compliant with initial HEP.    Baseline: see above Goal status: met   2.  Patient will improve lumbar extension AROM by at least 25% to improve ability to complete reaching activity.   Baseline: see above  Goal status: met   3.  Patient will maintain plank for at least 15 seconds indicative of improved lumbopelvic stability.  Baseline: see above  Goal status: ongoing    4. Patient will demonstrate knowledge and application of appropriate sitting posture to reduce stress on her back.              Baseline: see above  06/30/22: continues to require postural cues              Goal status: ongoing       LONG TERM GOALS: Target date: 07/29/2022       Patient will demonstrate 4+/5 bilateral middle trap strength to improve postural stability.  Baseline: see above Goal status: INITIAL   2.  Patient will demonstrate 5/5 bilateral hip abductor/extensor strength to improve stability about the chain with prolonged walking/standing activity.  Baseline: see above Goal status: INITIAL   3.  Patient will self-report tolerating at least 30 minutes of sitting and standing activity to improve her tolerance to playing with her daughter and completing household  tasks.  Baseline: see above Goal status: INITIAL   4.  Patient will report pain at worst rated as </=4/10 to reduce her current functional limitations.  Baseline: see above Goal status: INITIAL   5.  Patient will be independent with advanced home program to assist in management of her chronic condition.    Baseline: n/a Goal status: INITIAL     PLAN:   PT FREQUENCY: 2x/week   PT DURATION: 6-8 weeks    PLANNED INTERVENTIONS: Therapeutic exercises, Therapeutic activity, Neuromuscular re-education, Balance training, Gait training, Patient/Family education, Self Care, Dry Needling, Electrical stimulation, Spinal manipulation, Spinal mobilization, Cryotherapy, Moist heat, Manual therapy, and Re-evaluation.   PLAN FOR NEXT SESSION: review and progress HEP prn; spinal mobility, work into trunk extension as tolerated, core/hip strengthening, postural strengthening.   Gwendolyn Grant, PT, DPT, ATC 07/07/22 1:14 PM

## 2022-07-12 DIAGNOSIS — F4323 Adjustment disorder with mixed anxiety and depressed mood: Secondary | ICD-10-CM | POA: Diagnosis not present

## 2022-07-14 ENCOUNTER — Encounter: Payer: Medicaid Other | Admitting: Physical Therapy

## 2022-07-17 ENCOUNTER — Ambulatory Visit: Payer: Medicaid Other | Attending: Orthopedic Surgery

## 2022-07-17 DIAGNOSIS — R293 Abnormal posture: Secondary | ICD-10-CM | POA: Insufficient documentation

## 2022-07-17 DIAGNOSIS — M546 Pain in thoracic spine: Secondary | ICD-10-CM

## 2022-07-17 DIAGNOSIS — M5459 Other low back pain: Secondary | ICD-10-CM | POA: Diagnosis present

## 2022-07-17 DIAGNOSIS — M6281 Muscle weakness (generalized): Secondary | ICD-10-CM

## 2022-07-17 NOTE — Therapy (Signed)
OUTPATIENT PHYSICAL THERAPY TREATMENT NOTE   Patient Name: Carolyn Cole MRN: [id number] DOB:11-21-1990, 32 y.o., female Today's Date: 07/17/2022  PCP: Conception Oms, FNP  REFERRING PROVIDER: Estill Bamberg, MD    END OF SESSION:   PT End of Session - 07/17/22 1149     Visit Number 6    Number of Visits 17    Date for PT Re-Evaluation 08/01/22    Authorization Type [employer]    PT Start Time 1149    PT Stop Time 1232    PT Time Calculation (min) 43 min    Activity Tolerance Patient tolerated treatment well    Behavior During Therapy WFL for tasks assessed/performed              Past Medical History:  Diagnosis Date   Anemia    Fibroids    Papilledema, both eyes    Vertigo    Past Surgical History:  Procedure Laterality Date   APPENDECTOMY     Patient Active Problem List   Diagnosis Date Noted   Preeclampsia in postpartum period 02/08/2021   Encounter for induction of labor 01/28/2021   BMI 36.0-36.9,adult 01/27/2021   Fibroids 06/04/2018   Asthma 05/18/2018   Intramural leiomyoma of uterus 05/18/2018   Personal history of MRSA (methicillin resistant Staphylococcus aureus) 05/18/2018    REFERRING DIAG:  Neck, Thoracic , Low back pain    THERAPY DIAG:  Other low back pain  Pain in thoracic spine  Muscle weakness (generalized)  Abnormal posture  Rationale for Evaluation and Treatment Rehabilitation  PERTINENT HISTORY: vertigo, fibroids  PRECAUTIONS: none   SUBJECTIVE:                                                                                                                                                                                      SUBJECTIVE STATEMENT: Patient reports her back is sore, but not painful right now. She started back working out yesterday and is sore from this.   PAIN:  Are you having pain? None currently Yes: NPRS scale: 2 at worst/10 Pain location: mid/low back Pain description: ache,sore Aggravating factors:  sitting,standing,washing dishes,reading baby books, driving Relieving factors: hot shower   OBJECTIVE: (objective measures completed at initial evaluation unless otherwise dated) DIAGNOSTIC FINDINGS:  Thoracic X-ray IMPRESSION: Scoliotic curvature of the midthoracic spine, apex to the right with a Cobb angle of 20 degrees.   Lumbar X-ray: IMPRESSION: 1. Increased sclerosis in L5 and S1 near midline is nonspecific. Recommend a bone scan for further evaluation. If the bone scan is negative, no further follow-up is necessary. If the bone scan is positive, CT or MRI could further assess. 2. No  other abnormalities.   PATIENT SURVEYS:  Modified Oswestry 24% disability; 12/50    SCREENING FOR RED FLAGS: Bowel or bladder incontinence: No Spinal tumors: No Cauda equina syndrome: No Compression fracture: No Abdominal aneurysm: No   COGNITION: Overall cognitive status: Within functional limits for tasks assessed                          SENSATION: Not tested   MUSCLE LENGTH: Hamstrings: WNL bilaterally      POSTURE: rounded shoulders, forward head, and decreased lumbar lordosis   PALPATION: Pain and hypomobility T-spine and L-spine TTP bilateral thoracolumbar paraspinals    LUMBAR ROM:    AROM eval 06/16/22 06/26/22  Flexion WNL  Full   Extension 75% limited* 50% limited pain   25% limited pain  Right lateral flexion WNL  WNL "pulling"  Left lateral flexion WNL*  WNL "pulling"  Right rotation WNL  WNL  Left rotation WNL  WNL   (Blank rows = not tested) * pain     MMT:     MMT Right eval Left eval  Hip flexion 5 5  Hip extension 4 LBP pan 4 LBP pain  Hip abduction 4 4  Middle trap 4- 4-  Hip internal rotation      Hip external rotation      Knee flexion      Knee extension      Ankle dorsiflexion      Ankle plantarflexion      Ankle inversion      Ankle eversion       (Blank rows = not tested)   SPECIAL TESTS:  (-) Adams forward bend (+) ely  (+) thomas  for hip flexor and quad bilateral    FUNCTIONAL TESTS:  Plank 5 sec   06/30/22: plank: 8 seconds  GAIT: Distance walked: 10 ft  Assistive device utilized: None Level of assistance: Complete Independence Comments: WNL [facility] Adult PT Treatment:                                                DATE: 07/17/22 Therapeutic Exercise: Elliptical ramp 5 level 3 x 5 minutes  Supine posterior pelvic tilts x 10  Supine TA march 2 x 10  Supine bent knee fallout 2 x 10  90/90 march attempted d/c due to fibroid pain  Quadruped arm extension 2 x 10  Fire hydrant 2 x 10  Pallof press 2 x 10 green band  Updated HEP    [facility] Adult PT Treatment:                                                DATE: 07/07/22 Therapeutic Exercise: Elliptical level 4, grade 6 x 5 minutes Supine posterior pelvics x 10  Supine TA march x 10  SLR with posterior pelvic tilts 2 x 10  Attempted 90/90 hold unable Seated TA march 2 x 10   Self Care: Education on energy conservation, posture, lifting mechanics with handouts provided.  Access Code: [email or web address] URL: [email or web address] Date: 07/07/2022 Prepared by: Letitia Libra  Patient Education - Understanding Energy Conservation - Household Activities - Housekeeping - Designer, multimedia - Office Posture   [facility] Adult PT Treatment:  DATE: 06/30/22 Therapeutic Exercise: Elliptical level 4, grade 6 x 5 minutes  Seated thoracic extension x 5 Supine TA march 2 x 10  Supine bent knee fallout 2 x 10  Thread the needle x 10  Plank on elbows 3 trials to fatigue  Hip bridge with abduction blue band 2 x 10  Updated HEP        PATIENT EDUCATION:  Education details: see treatment  Person educated: Patient Education method: Programmer, multimediaxplanation, Demonstration, handout, cues  Education comprehension: verbalized understanding, returned demo, cues    HOME EXERCISE PROGRAM: Access Code: [email or web address] URL:  [email or web address] Date: 06/03/2022 Prepared by: Letitia LibraSamantha Guilianna Mckoy   Exercises - Doorway Pec Stretch at 90 Degrees Abduction  - 1 x daily - 7 x weekly - 3 sets - 30 sec  hold - Sidelying Thoracic Rotation with Open Book  - 1 x daily - 7 x weekly - 1 sets - 10 reps - Cat Cow  - 1 x daily - 7 x weekly - 1 sets - 10 reps - Supine Quadriceps Stretch with Strap on Table  - 1 x daily - 7 x weekly - 3 sets - 30 sec  hold - Seated Thoracic Lumbar Extension  - 1 x daily - 7 x weekly - 1 sets - 10 reps   ASSESSMENT:   CLINICAL IMPRESSION: Patient arrives without reports of back pain. Focused on progression of dynamic core stabilization. She is unable to tolerate table top positioning for dynamic core stabilization as she reports this increases her fibroid pain. Better tolerance to quadruped strengthening, but quickly fatigues. HEP was updated to include further strengthening. No reports of back pain throughout session.    OBJECTIVE IMPAIRMENTS: decreased activity tolerance, decreased endurance, decreased knowledge of condition, difficulty walking, decreased ROM, decreased strength, hypomobility, increased fascial restrictions, impaired flexibility, postural dysfunction, and pain.    ACTIVITY LIMITATIONS: carrying, lifting, bending, sitting, standing, squatting, locomotion level, and caring for others   PARTICIPATION LIMITATIONS: meal prep, cleaning, laundry, driving, shopping, and community activity   PERSONAL FACTORS: Fitness and Time since onset of injury/illness/exacerbation are also affecting patient's functional outcome.    REHAB POTENTIAL: Good   CLINICAL DECISION MAKING: Stable/uncomplicated   EVALUATION COMPLEXITY: Low     GOALS: Goals reviewed with patient? Yes   SHORT TERM GOALS: Target date: 07/01/2022       Patient will be independent and compliant with initial HEP.    Baseline: see above Goal status: met   2.  Patient will improve lumbar extension AROM by at  least 25% to improve ability to complete reaching activity.   Baseline: see above  Goal status: met   3.  Patient will maintain plank for at least 15 seconds indicative of improved lumbopelvic stability.  Baseline: see above  Goal status: ongoing    4. Patient will demonstrate knowledge and application of appropriate sitting posture to reduce stress on her back.              Baseline: see above  06/30/22: continues to require postural cues              Goal status: ongoing       LONG TERM GOALS: Target date: 07/29/2022       Patient will demonstrate 4+/5 bilateral middle trap strength to improve postural stability.  Baseline: see above Goal status: INITIAL   2.  Patient will demonstrate 5/5 bilateral hip abductor/extensor strength to improve stability about the chain with prolonged walking/standing activity.  Baseline: see above  Goal status: INITIAL   3.  Patient will self-report tolerating at least 30 minutes of sitting and standing activity to improve her tolerance to playing with her daughter and completing household tasks.  Baseline: see above Goal status: INITIAL   4.  Patient will report pain at worst rated as </=4/10 to reduce her current functional limitations.  Baseline: see above Goal status: INITIAL   5.  Patient will be independent with advanced home program to assist in management of her chronic condition.    Baseline: n/a Goal status: INITIAL     PLAN:   PT FREQUENCY: 2x/week   PT DURATION: 6-8 weeks    PLANNED INTERVENTIONS: Therapeutic exercises, Therapeutic activity, Neuromuscular re-education, Balance training, Gait training, Patient/Family education, Self Care, Dry Needling, Electrical stimulation, Spinal manipulation, Spinal mobilization, Cryotherapy, Moist heat, Manual therapy, and Re-evaluation.   PLAN FOR NEXT SESSION: review and progress HEP prn; spinal mobility, work into trunk extension as tolerated, core/hip strengthening, postural  strengthening.   Letitia LibraSamantha Rashaan Wyles, PT, DPT, ATC 07/17/22 12:32 PM

## 2022-07-20 DIAGNOSIS — F4323 Adjustment disorder with mixed anxiety and depressed mood: Secondary | ICD-10-CM | POA: Diagnosis not present

## 2022-07-21 ENCOUNTER — Ambulatory Visit: Payer: Medicaid Other

## 2022-07-27 DIAGNOSIS — F4323 Adjustment disorder with mixed anxiety and depressed mood: Secondary | ICD-10-CM | POA: Diagnosis not present

## 2022-07-28 ENCOUNTER — Other Ambulatory Visit: Payer: Self-pay

## 2022-07-28 ENCOUNTER — Ambulatory Visit (INDEPENDENT_AMBULATORY_CARE_PROVIDER_SITE_OTHER): Payer: Medicaid Other | Admitting: Obstetrics and Gynecology

## 2022-07-28 ENCOUNTER — Encounter: Payer: Self-pay | Admitting: Obstetrics and Gynecology

## 2022-07-28 VITALS — BP 121/90 | HR 76 | Wt 231.9 lb

## 2022-07-28 DIAGNOSIS — N939 Abnormal uterine and vaginal bleeding, unspecified: Secondary | ICD-10-CM | POA: Diagnosis not present

## 2022-07-28 DIAGNOSIS — D219 Benign neoplasm of connective and other soft tissue, unspecified: Secondary | ICD-10-CM | POA: Diagnosis not present

## 2022-07-28 DIAGNOSIS — N941 Unspecified dyspareunia: Secondary | ICD-10-CM | POA: Diagnosis not present

## 2022-07-28 NOTE — Progress Notes (Signed)
NEW GYNECOLOGY PATIENT Patient name: Carolyn Cole MRN [id number]  Date of birth: Jan 27, 1991 Chief Complaint:   Gynecologic Exam     History:  Carolyn Cole is a 32 y.o. G1P1001 being seen today for fibroids.  Previously discussed myomectomy with GYN and concerned about impact on fertility and future pregnancies. Would like to have more children if able. Had significant pain in her pregnancy at 5 months. Had fibroid during her pregnancy.   Having pain with intercourse and in PFPT. Did PFPT before and then got pregnant. Not able to tolerate intercourse in certain positions.   Late June would be best for surgery       Gynecologic History Patient's last menstrual period was 07/24/2022. Contraception: none Last Pap: 01/2020 NILM Last Mammogram: n/a Last Colonoscopy: n/a  Obstetric History OB History  Gravida Para Term Preterm AB Living  SAB IAB Ectopic Multiple Live Births        0 1    # Outcome Date GA Lbr Len/2nd Weight Sex Delivery Anes PTL Lv  1 Term 01/29/21 [redacted]w[redacted]d 04:47 / 00:46 7 lb 15 oz (3.6 kg) F Vag-Spont EPI  LIV    Past Medical History:  Diagnosis Date   Anemia    Fibroids    Papilledema, both eyes    Vertigo     Past Surgical History:  Procedure Laterality Date   APPENDECTOMY      Current Outpatient Medications on File Prior to Visit  Medication Sig Dispense Refill   Prenatal Vit-Fe Fumarate-FA (PRENATAL MULTIVITAMIN) TABS tablet Take 1 tablet by mouth daily at 12 noon. (Patient not taking: Reported on 07/28/2022)     No current facility-administered medications on file prior to visit.    No Known Allergies  Social History:  reports that she has never smoked. She has never used smokeless tobacco. She reports that she does not currently use alcohol. She reports that she does not use drugs.  Family History  Problem Relation Age of Onset   Thyroid disease Mother    Hypertension Mother    Hypertension Father    Ulcers Father    Gout  Paternal Grandmother    Chiari malformation Sister     The following portions of the patient's history were reviewed and updated as appropriate: allergies, current medications, past family history, past medical history, past social history, past surgical history and problem list.  Review of Systems Pertinent items noted in HPI and remainder of comprehensive ROS otherwise negative.  Physical Exam:  BP (!) 121/94   Pulse 76   Wt 231 lb 14.4 oz (105.2 kg)   LMP 07/24/2022   BMI 39.81 kg/m  Physical Exam Vitals and nursing note reviewed.  Constitutional:      Appearance: Normal appearance.  Cardiovascular:     Rate and Rhythm: Normal rate.  Pulmonary:     Effort: Pulmonary effort is normal.     Breath sounds: Normal breath sounds.  Abdominal:     Comments: Mild tenderness  Healed old bellybutton piercing supraumbilically   Neurological:     General: No focal deficit present.     Mental Status: She is alert and oriented to person, place, and time.  Psychiatric:        Mood and Affect: Mood normal.        Behavior: Behavior normal.        Thought Content: Thought content normal.        Judgment: Judgment normal.  Assessment and Plan:   1. Abnormal uterine bleeding (AUB) Labs today - CBC - Hemoglobin A1c  2. Dyspareunia, female Likely due to fibroid burden. Reviewed that would benefit from PFPT as well given prior need and continued fibroid burden. Reviewed surgery may briefly flare pelvic floor pain and recommend continuance of therapy after surgery.    3. Fibroids Large pedunculated fibroids on [place]. Discussed that given pedunculated nature, impact on fertility likely low. Pending location of intramural fibroid, may need cesarean for future pregnancies. Reviewed that pending procedure, will need to wait 3-6 months prior to conception.  Unclear how large stalk is or location of stalks from pedunculated fibroid. Reviewed risk of hysterectomy with myomectomy and  postoperative expectations.  Late June would be best for her schedule   Routine preventative health maintenance measures emphasized. Please refer to After Visit Summary for other counseling recommendations.   Follow-up: Return for GYN Follow Up. - pending MRI and surgical date      Lorriane Shire, MD Obstetrician & Gynecologist, Faculty Practice Minimally [facility]nec[facility], [facility]

## 2022-07-29 LAB — HEMOGLOBIN A1C
Est. average glucose Bld gHb Est-mCnc: 97 mg/dL
Hgb A1c MFr Bld: 5 % (ref 4.8–5.6)

## 2022-07-29 LAB — CBC
Hematocrit: 42.3 % (ref 34.0–46.6)
Hemoglobin: 14.3 g/dL (ref 11.1–15.9)
MCH: 33.2 pg — ABNORMAL HIGH (ref 26.6–33.0)
MCHC: 33.8 g/dL (ref 31.5–35.7)
MCV: 98 fL — ABNORMAL HIGH (ref 79–97)
Platelets: 302 10*3/uL (ref 150–450)
RBC: 4.31 x10E6/uL (ref 3.77–5.28)
RDW: 12.8 % (ref 11.7–15.4)
WBC: 4.5 10*3/uL (ref 3.4–10.8)

## 2022-07-31 ENCOUNTER — Ambulatory Visit: Payer: Medicaid Other

## 2022-08-04 ENCOUNTER — Encounter: Payer: Self-pay | Admitting: Obstetrics and Gynecology

## 2022-08-05 ENCOUNTER — Ambulatory Visit: Payer: Medicaid Other

## 2022-08-05 DIAGNOSIS — M5459 Other low back pain: Secondary | ICD-10-CM | POA: Diagnosis not present

## 2022-08-05 DIAGNOSIS — R293 Abnormal posture: Secondary | ICD-10-CM

## 2022-08-05 DIAGNOSIS — M546 Pain in thoracic spine: Secondary | ICD-10-CM

## 2022-08-05 DIAGNOSIS — M6281 Muscle weakness (generalized): Secondary | ICD-10-CM

## 2022-08-05 NOTE — Therapy (Signed)
OUTPATIENT PHYSICAL THERAPY TREATMENT NOTE/RE-CERTIFICATION   Patient Name: Carolyn Cole MRN: [id number] DOB:1990/10/23, 32 y.o., female Today's Date: 08/05/2022  PCP: Conception Oms, FNP  REFERRING PROVIDER: Estill Bamberg, MD    END OF SESSION:   PT End of Session - 08/05/22 1147     Visit Number 7    Number of Visits 13    Date for PT Re-Evaluation 09/19/22    Authorization Type [employer]    PT Start Time 1147    PT Stop Time 1230    PT Time Calculation (min) 43 min    Activity Tolerance Patient tolerated treatment well    Behavior During Therapy WFL for tasks assessed/performed               Past Medical History:  Diagnosis Date   Anemia    Fibroids    Papilledema, both eyes    Vertigo    Past Surgical History:  Procedure Laterality Date   APPENDECTOMY     Patient Active Problem List   Diagnosis Date Noted   Preeclampsia in postpartum period 02/08/2021   Encounter for induction of labor 01/28/2021   BMI 36.0-36.9,adult 01/27/2021   Fibroids 06/04/2018   Asthma 05/18/2018   Intramural leiomyoma of uterus 05/18/2018   Personal history of MRSA (methicillin resistant Staphylococcus aureus) 05/18/2018    REFERRING DIAG:  Neck, Thoracic , Low back pain    THERAPY DIAG:  Other low back pain  Pain in thoracic spine  Muscle weakness (generalized)  Abnormal posture  Rationale for Evaluation and Treatment Rehabilitation  PERTINENT HISTORY: vertigo, fibroids  PRECAUTIONS: none   SUBJECTIVE:                                                                                                                                                                                      SUBJECTIVE STATEMENT: "I've been doing my exercises. I have been busy. My back has been feeling a little bit better. I am feeling more motivated." She recently saw her OB-GYN and is going to have an MRI to further assess her fibroids.    PAIN:  Are you having pain? Yes: NPRS scale:  2/10 Pain location: mid/low back Pain description: pulling Aggravating factors: sitting,standing,washing dishes,reading baby books, driving Relieving factors: hot shower   OBJECTIVE: (objective measures completed at initial evaluation unless otherwise dated) DIAGNOSTIC FINDINGS:  Thoracic X-ray IMPRESSION: Scoliotic curvature of the midthoracic spine, apex to the right with a Cobb angle of 20 degrees.   Lumbar X-ray: IMPRESSION: 1. Increased sclerosis in L5 and S1 near midline is nonspecific. Recommend a bone scan for further evaluation. If the bone scan is negative, no further follow-up  is necessary. If the bone scan is positive, CT or MRI could further assess. 2. No other abnormalities.   PATIENT SURVEYS:  Modified Oswestry 24% disability; 12/50    SCREENING FOR RED FLAGS: Bowel or bladder incontinence: No Spinal tumors: No Cauda equina syndrome: No Compression fracture: No Abdominal aneurysm: No   COGNITION: Overall cognitive status: Within functional limits for tasks assessed                          SENSATION: Not tested   MUSCLE LENGTH: Hamstrings: WNL bilaterally      POSTURE: rounded shoulders, forward head, and decreased lumbar lordosis   PALPATION: Pain and hypomobility T-spine and L-spine TTP bilateral thoracolumbar paraspinals    LUMBAR ROM:    AROM eval 06/16/22 06/26/22  Flexion WNL  Full   Extension 75% limited* 50% limited pain   25% limited pain  Right lateral flexion WNL  WNL "pulling"  Left lateral flexion WNL*  WNL "pulling"  Right rotation WNL  WNL  Left rotation WNL  WNL   (Blank rows = not tested) * pain     MMT:     MMT Right eval Left eval 08/05/22  Hip flexion 5 5   Hip extension 4 LBP pan 4 LBP pain Rt: 4+; Lt:4  Hip abduction 4 4 Rt: 4+; Lt: 4+  Middle trap 4- 4- 4/5 bilateral   Hip internal rotation       Hip external rotation       Knee flexion       Knee extension       Ankle dorsiflexion       Ankle plantarflexion        Ankle inversion       Ankle eversion        (Blank rows = not tested)   SPECIAL TESTS:  (-) Adams forward bend (+) ely  (+) thomas for hip flexor and quad bilateral    FUNCTIONAL TESTS:  Plank 5 sec   06/30/22: plank: 8 seconds   08/05/22 plank: 15 seconds  GAIT: Distance walked: 10 ft  Assistive device utilized: None Level of assistance: Complete Independence Comments: WNL [facility] Adult PT Treatment:                                                DATE: 08/05/22 Therapeutic Exercise: Quadruped arm extension 2 x 10  Quadruped leg extension 2 x 10  Supine serratus punch 2 x 15; 5# Resisted rows blue band 2 x 15  Seated horizontal shoulder abduction red band 2 x 10  Updated HEP   Therapeutic Activity: Re-assessment to determine overall progress, educating patient on progress towards goals.     [facility] Adult PT Treatment:                                                DATE: 07/17/22 Therapeutic Exercise: Elliptical ramp 5 level 3 x 5 minutes  Supine posterior pelvic tilts x 10  Supine TA march 2 x 10  Supine bent knee fallout 2 x 10  90/90 march attempted d/c due to fibroid pain  Quadruped arm extension 2 x 10  Fire hydrant 2 x 10  Pallof press 2 x 10 green band  Updated HEP    [facility] Adult PT Treatment:                                                DATE: 07/07/22 Therapeutic Exercise: Elliptical level 4, grade 6 x 5 minutes Supine posterior pelvics x 10  Supine TA march x 10  SLR with posterior pelvic tilts 2 x 10  Attempted 90/90 hold unable Seated TA march 2 x 10   Self Care: Education on energy conservation, posture, lifting mechanics with handouts provided.  Access Code: [email or web address] URL: [email or web address] Date: 07/07/2022 Prepared by: Letitia Libra  Patient Education - Understanding Energy Conservation - Household Activities - Housekeeping - Designer, multimedia - Office Posture   PATIENT EDUCATION:  Education details: see treatment  Person  educated: Patient Education method: Programmer, multimedia, Facilities manager, handout, cues  Education comprehension: verbalized understanding, returned demo, cues    HOME EXERCISE PROGRAM: Access Code: [email or web address] URL: [email or web address] Date: 06/03/2022 Prepared by: Letitia Libra   Exercises - Doorway Pec Stretch at 90 Degrees Abduction  - 1 x daily - 7 x weekly - 3 sets - 30 sec  hold - Sidelying Thoracic Rotation with Open Book  - 1 x daily - 7 x weekly - 1 sets - 10 reps - Cat Cow  - 1 x daily - 7 x weekly - 1 sets - 10 reps - Supine Quadriceps Stretch with Strap on Table  - 1 x daily - 7 x weekly - 3 sets - 30 sec  hold - Seated Thoracic Lumbar Extension  - 1 x daily - 7 x weekly - 1 sets - 10 reps   ASSESSMENT:   CLINICAL IMPRESSION: Patient has attended 7 PT sessions since the start of care on 06/03/22 reporting an overall improvement in her back pain. Her sessions have focused more on foundational core and postural strengthening with patient demonstrating good core activation/awareness in gravity eliminated positioning. She will benefit from continued skilled PT 1 x week for 6 additional weeks to fully integrate dynamic core stabilization into standing and further progress her postural strengthening in order to optimize her function.    OBJECTIVE IMPAIRMENTS: decreased activity tolerance, decreased endurance, decreased knowledge of condition, difficulty walking, decreased ROM, decreased strength, hypomobility, increased fascial restrictions, impaired flexibility, postural dysfunction, and pain.    ACTIVITY LIMITATIONS: carrying, lifting, bending, sitting, standing, squatting, locomotion level, and caring for others   PARTICIPATION LIMITATIONS: meal prep, cleaning, laundry, driving, shopping, and community activity   PERSONAL FACTORS: Fitness and Time since onset of injury/illness/exacerbation are also affecting patient's functional outcome.    REHAB POTENTIAL: Good   CLINICAL  DECISION MAKING: Stable/uncomplicated   EVALUATION COMPLEXITY: Low     GOALS: Goals reviewed with patient? Yes   SHORT TERM GOALS: Target date: 07/01/2022       Patient will be independent and compliant with initial HEP.    Baseline: see above Goal status: met   2.  Patient will improve lumbar extension AROM by at least 25% to improve ability to complete reaching activity.   Baseline: see above  Goal status: met   3.  Patient will maintain plank for at least 15 seconds indicative of improved lumbopelvic stability.  Baseline: see above  Goal status: met    4. Patient will demonstrate knowledge and application of appropriate sitting posture to  reduce stress on her back.              Baseline: see above  06/30/22: continues to require postural cues  08/05/22: able to self-correct              Goal status: met       LONG TERM GOALS: Target date: 09/19/22       Patient will demonstrate 4+/5 bilateral middle trap strength to improve postural stability.  Baseline: see above Goal status: ongoing    2.  Patient will demonstrate 5/5 bilateral hip abductor/extensor strength to improve stability about the chain with prolonged walking/standing activity.  Baseline: see above Goal status: ongoing    3.  Patient will self-report tolerating at least 30 minutes of sitting and standing activity to improve her tolerance to playing with her daughter and completing household tasks.  Baseline: see above 08/05/22: "maybe 20 minutes" Goal status: ongoing    4.  Patient will report pain at worst rated as </=4/10 to reduce her current functional limitations.  Baseline: see above 08/05/22: 4/10 at worst Goal status: met   5.  Patient will be independent with advanced home program to assist in management of her chronic condition.    Baseline: n/a Goal status: ongoing      PLAN:   PT FREQUENCY: 1x/week    PT DURATION: 6 weeks   PLANNED INTERVENTIONS: Therapeutic exercises, Therapeutic  activity, Neuromuscular re-education, Balance training, Gait training, Patient/Family education, Self Care, Dry Needling, Electrical stimulation, Spinal manipulation, Spinal mobilization, Cryotherapy, Moist heat, Manual therapy, and Re-evaluation.   PLAN FOR NEXT SESSION: review and progress HEP prn; spinal mobility, work into trunk extension as tolerated, core/hip strengthening, postural strengthening.   Letitia Libra, PT, DPT, ATC 08/05/22 12:31 PM

## 2022-08-07 DIAGNOSIS — F4323 Adjustment disorder with mixed anxiety and depressed mood: Secondary | ICD-10-CM | POA: Diagnosis not present

## 2022-08-12 ENCOUNTER — Ambulatory Visit: Payer: Medicaid Other | Attending: Orthopedic Surgery

## 2022-08-12 DIAGNOSIS — M546 Pain in thoracic spine: Secondary | ICD-10-CM | POA: Diagnosis present

## 2022-08-12 DIAGNOSIS — F4323 Adjustment disorder with mixed anxiety and depressed mood: Secondary | ICD-10-CM | POA: Diagnosis not present

## 2022-08-12 DIAGNOSIS — R293 Abnormal posture: Secondary | ICD-10-CM

## 2022-08-12 DIAGNOSIS — M5459 Other low back pain: Secondary | ICD-10-CM | POA: Diagnosis present

## 2022-08-12 DIAGNOSIS — M6281 Muscle weakness (generalized): Secondary | ICD-10-CM | POA: Diagnosis present

## 2022-08-12 NOTE — Therapy (Signed)
OUTPATIENT PHYSICAL THERAPY TREATMENT NOTE   Patient Name: Carolyn Cole MRN: [id number] DOB:1991-01-21, 32 y.o., female Today's Date: 08/12/2022  PCP: Conception Oms, FNP  REFERRING PROVIDER: Estill Bamberg, MD    END OF SESSION:   PT End of Session - 08/12/22 1147     Visit Number 8    Number of Visits 13    Date for PT Re-Evaluation 09/19/22    Authorization Type [employer]    PT Start Time 1147    PT Stop Time 1228    PT Time Calculation (min) 41 min    Activity Tolerance Patient tolerated treatment well    Behavior During Therapy WFL for tasks assessed/performed                Past Medical History:  Diagnosis Date   Anemia    Fibroids    Papilledema, both eyes    Vertigo    Past Surgical History:  Procedure Laterality Date   APPENDECTOMY     Patient Active Problem List   Diagnosis Date Noted   Preeclampsia in postpartum period 02/08/2021   Encounter for induction of labor 01/28/2021   BMI 36.0-36.9,adult 01/27/2021   Fibroids 06/04/2018   Asthma 05/18/2018   Intramural leiomyoma of uterus 05/18/2018   Personal history of MRSA (methicillin resistant Staphylococcus aureus) 05/18/2018    REFERRING DIAG:  Neck, Thoracic , Low back pain    THERAPY DIAG:  Other low back pain  Pain in thoracic spine  Muscle weakness (generalized)  Abnormal posture  Rationale for Evaluation and Treatment Rehabilitation  PERTINENT HISTORY: vertigo, fibroids  PRECAUTIONS: none   SUBJECTIVE:                                                                                                                                                                                      SUBJECTIVE STATEMENT: "I've been busy and using my back a lot, so it hurts."   PAIN:  Are you having pain? Yes: NPRS scale: 4/10 Pain location: mid/low back Pain description: pulling Aggravating factors: sitting,standing,washing dishes,reading baby books, driving Relieving factors: hot  shower   OBJECTIVE: (objective measures completed at initial evaluation unless otherwise dated) DIAGNOSTIC FINDINGS:  Thoracic X-ray IMPRESSION: Scoliotic curvature of the midthoracic spine, apex to the right with a Cobb angle of 20 degrees.   Lumbar X-ray: IMPRESSION: 1. Increased sclerosis in L5 and S1 near midline is nonspecific. Recommend a bone scan for further evaluation. If the bone scan is negative, no further follow-up is necessary. If the bone scan is positive, CT or MRI could further assess. 2. No other abnormalities.   PATIENT SURVEYS:  Modified Oswestry 24% disability; 12/50  SCREENING FOR RED FLAGS: Bowel or bladder incontinence: No Spinal tumors: No Cauda equina syndrome: No Compression fracture: No Abdominal aneurysm: No   COGNITION: Overall cognitive status: Within functional limits for tasks assessed                          SENSATION: Not tested   MUSCLE LENGTH: Hamstrings: WNL bilaterally      POSTURE: rounded shoulders, forward head, and decreased lumbar lordosis   PALPATION: Pain and hypomobility T-spine and L-spine TTP bilateral thoracolumbar paraspinals    LUMBAR ROM:    AROM eval 06/16/22 06/26/22  Flexion WNL  Full   Extension 75% limited* 50% limited pain   25% limited pain  Right lateral flexion WNL  WNL "pulling"  Left lateral flexion WNL*  WNL "pulling"  Right rotation WNL  WNL  Left rotation WNL  WNL   (Blank rows = not tested) * pain     MMT:     MMT Right eval Left eval 08/05/22  Hip flexion 5 5   Hip extension 4 LBP pan 4 LBP pain Rt: 4+; Lt:4  Hip abduction 4 4 Rt: 4+; Lt: 4+  Middle trap 4- 4- 4/5 bilateral   Hip internal rotation       Hip external rotation       Knee flexion       Knee extension       Ankle dorsiflexion       Ankle plantarflexion       Ankle inversion       Ankle eversion        (Blank rows = not tested)   SPECIAL TESTS:  (-) Adams forward bend (+) ely  (+) thomas for hip flexor and quad  bilateral    FUNCTIONAL TESTS:  Plank 5 sec   06/30/22: plank: 8 seconds   08/05/22 plank: 15 seconds  GAIT: Distance walked: 10 ft  Assistive device utilized: None Level of assistance: Complete Independence Comments: WNL [facility] Adult PT Treatment:                                                DATE: 08/12/22 Therapeutic Exercise: Elliptical level 4, ramp 3 x 5 minutes Quadruped leg extension 2 x 10 Bird dog x 10  Squat to table 2 x 10   Seated hip hinge multiple reps Standing hip hinge attempted d/c due to poor form  Seated HS stretch x 30 sec Stability ball rollout x 2 minutes   Fire hydrant 2 x 10     [facility] Adult PT Treatment:                                                DATE: 08/05/22 Therapeutic Exercise: Quadruped arm extension 2 x 10  Quadruped leg extension 2 x 10  Supine serratus punch 2 x 15; 5# Resisted rows blue band 2 x 15  Seated horizontal shoulder abduction red band 2 x 10  Updated HEP   Therapeutic Activity: Re-assessment to determine overall progress, educating patient on progress towards goals.     [facility] Adult PT Treatment:  DATE: 07/17/22 Therapeutic Exercise: Elliptical ramp 5 level 3 x 5 minutes  Supine posterior pelvic tilts x 10  Supine TA march 2 x 10  Supine bent knee fallout 2 x 10  90/90 march attempted d/c due to fibroid pain  Quadruped arm extension 2 x 10  Fire hydrant 2 x 10  Pallof press 2 x 10 green band  Updated HEP    PATIENT EDUCATION:  Education details: HEP review; Estate manager/land agent with lifting  Person educated: Patient Education method: Programmer, multimedia, Demonstration Education comprehension: verbalized understanding   HOME EXERCISE PROGRAM: Access Code: [email or web address] URL: [email or web address] Date: 06/03/2022 Prepared by: Letitia Libra   Exercises - Doorway Pec Stretch at 90 Degrees Abduction  - 1 x daily - 7 x weekly - 3 sets - 30 sec  hold - Sidelying Thoracic Rotation  with Open Book  - 1 x daily - 7 x weekly - 1 sets - 10 reps - Cat Cow  - 1 x daily - 7 x weekly - 1 sets - 10 reps - Supine Quadriceps Stretch with Strap on Table  - 1 x daily - 7 x weekly - 3 sets - 30 sec  hold - Seated Thoracic Lumbar Extension  - 1 x daily - 7 x weekly - 1 sets - 10 reps   ASSESSMENT:   CLINICAL IMPRESSION: Patient arrives with mild back pain attributed to overuse this past week. Focused on dynamic core stabilization and lifting/bending mechanics. She demonstrates good form with squat to table, but has significant difficulty performing hip hinge. She reported mid back pain with hip hinge activity that was reduced after trunk mobility and hamstring stretching. No reports of back pain at conclusion, but endorsed muscle fatigue.    OBJECTIVE IMPAIRMENTS: decreased activity tolerance, decreased endurance, decreased knowledge of condition, difficulty walking, decreased ROM, decreased strength, hypomobility, increased fascial restrictions, impaired flexibility, postural dysfunction, and pain.    ACTIVITY LIMITATIONS: carrying, lifting, bending, sitting, standing, squatting, locomotion level, and caring for others   PARTICIPATION LIMITATIONS: meal prep, cleaning, laundry, driving, shopping, and community activity   PERSONAL FACTORS: Fitness and Time since onset of injury/illness/exacerbation are also affecting patient's functional outcome.    REHAB POTENTIAL: Good   CLINICAL DECISION MAKING: Stable/uncomplicated   EVALUATION COMPLEXITY: Low     GOALS: Goals reviewed with patient? Yes   SHORT TERM GOALS: Target date: 07/01/2022       Patient will be independent and compliant with initial HEP.    Baseline: see above Goal status: met   2.  Patient will improve lumbar extension AROM by at least 25% to improve ability to complete reaching activity.   Baseline: see above  Goal status: met   3.  Patient will maintain plank for at least 15 seconds indicative of improved  lumbopelvic stability.  Baseline: see above  Goal status: met    4. Patient will demonstrate knowledge and application of appropriate sitting posture to reduce stress on her back.              Baseline: see above  06/30/22: continues to require postural cues  08/05/22: able to self-correct              Goal status: met       LONG TERM GOALS: Target date: 09/19/22       Patient will demonstrate 4+/5 bilateral middle trap strength to improve postural stability.  Baseline: see above Goal status: ongoing    2.  Patient will demonstrate 5/5 bilateral hip abductor/extensor  strength to improve stability about the chain with prolonged walking/standing activity.  Baseline: see above Goal status: ongoing    3.  Patient will self-report tolerating at least 30 minutes of sitting and standing activity to improve her tolerance to playing with her daughter and completing household tasks.  Baseline: see above 08/05/22: "maybe 20 minutes" Goal status: ongoing    4.  Patient will report pain at worst rated as </=4/10 to reduce her current functional limitations.  Baseline: see above 08/05/22: 4/10 at worst Goal status: met   5.  Patient will be independent with advanced home program to assist in management of her chronic condition.    Baseline: n/a Goal status: ongoing      PLAN:   PT FREQUENCY: 1x/week    PT DURATION: 6 weeks   PLANNED INTERVENTIONS: Therapeutic exercises, Therapeutic activity, Neuromuscular re-education, Balance training, Gait training, Patient/Family education, Self Care, Dry Needling, Electrical stimulation, Spinal manipulation, Spinal mobilization, Cryotherapy, Moist heat, Manual therapy, and Re-evaluation.   PLAN FOR NEXT SESSION: review and progress HEP prn; spinal mobility, work into trunk extension as tolerated, core/hip strengthening, postural strengthening.   Letitia Libra, PT, DPT, ATC 08/12/22 12:29 PM

## 2022-08-19 ENCOUNTER — Ambulatory Visit: Payer: Medicaid Other

## 2022-08-20 DIAGNOSIS — F4323 Adjustment disorder with mixed anxiety and depressed mood: Secondary | ICD-10-CM | POA: Diagnosis not present

## 2022-08-21 DIAGNOSIS — D259 Leiomyoma of uterus, unspecified: Secondary | ICD-10-CM | POA: Diagnosis not present

## 2022-08-25 ENCOUNTER — Ambulatory Visit: Payer: Medicaid Other

## 2022-08-25 DIAGNOSIS — R293 Abnormal posture: Secondary | ICD-10-CM

## 2022-08-25 DIAGNOSIS — M5459 Other low back pain: Secondary | ICD-10-CM

## 2022-08-25 DIAGNOSIS — M6281 Muscle weakness (generalized): Secondary | ICD-10-CM

## 2022-08-25 DIAGNOSIS — M546 Pain in thoracic spine: Secondary | ICD-10-CM

## 2022-08-25 NOTE — Therapy (Signed)
OUTPATIENT PHYSICAL THERAPY TREATMENT NOTE   Patient Name: Carolyn Cole MRN: [id number] DOB:Apr 25, 1990, 32 y.o., female Today's Date: 08/25/2022  PCP: Conception Oms, FNP  REFERRING PROVIDER: Estill Bamberg, MD    END OF SESSION:   PT End of Session - 08/25/22 1446     Visit Number 9    Number of Visits 13    Date for PT Re-Evaluation 09/19/22    Authorization Type [employer]    PT Start Time 1446    PT Stop Time 1529    PT Time Calculation (min) 43 min    Activity Tolerance Patient tolerated treatment well    Behavior During Therapy WFL for tasks assessed/performed                 Past Medical History:  Diagnosis Date   Anemia    Fibroids    Papilledema, both eyes    Vertigo    Past Surgical History:  Procedure Laterality Date   APPENDECTOMY     Patient Active Problem List   Diagnosis Date Noted   Preeclampsia in postpartum period 02/08/2021   Encounter for induction of labor 01/28/2021   BMI 36.0-36.9,adult 01/27/2021   Fibroids 06/04/2018   Asthma 05/18/2018   Intramural leiomyoma of uterus 05/18/2018   Personal history of MRSA (methicillin resistant Staphylococcus aureus) 05/18/2018    REFERRING DIAG:  Neck, Thoracic , Low back pain    THERAPY DIAG:  Other low back pain  Pain in thoracic spine  Muscle weakness (generalized)  Abnormal posture  Rationale for Evaluation and Treatment Rehabilitation  PERTINENT HISTORY: vertigo, fibroids  PRECAUTIONS: none   SUBJECTIVE:                                                                                                                                                                                      SUBJECTIVE STATEMENT: "I did something careless yesterday." She reports sitting in poor posture for a long time yesterday and cleaning in a quick amount of time.   PAIN:  Are you having pain?none currently Yes: NPRS scale: 4 at worst/10 Pain location: mid/low back Pain description:  pulling,sore Aggravating factors: sitting,standing,washing dishes,reading baby books, driving Relieving factors: hot shower   OBJECTIVE: (objective measures completed at initial evaluation unless otherwise dated) DIAGNOSTIC FINDINGS:  Thoracic X-ray IMPRESSION: Scoliotic curvature of the midthoracic spine, apex to the right with a Cobb angle of 20 degrees.   Lumbar X-ray: IMPRESSION: 1. Increased sclerosis in L5 and S1 near midline is nonspecific. Recommend a bone scan for further evaluation. If the bone scan is negative, no further follow-up is necessary. If the bone scan is positive, CT or MRI could  further assess. 2. No other abnormalities.   PATIENT SURVEYS:  Modified Oswestry 24% disability; 12/50    SCREENING FOR RED FLAGS: Bowel or bladder incontinence: No Spinal tumors: No Cauda equina syndrome: No Compression fracture: No Abdominal aneurysm: No   COGNITION: Overall cognitive status: Within functional limits for tasks assessed                          SENSATION: Not tested   MUSCLE LENGTH: Hamstrings: WNL bilaterally      POSTURE: rounded shoulders, forward head, and decreased lumbar lordosis   PALPATION: Pain and hypomobility T-spine and L-spine TTP bilateral thoracolumbar paraspinals    LUMBAR ROM:    AROM eval 06/16/22 06/26/22  Flexion WNL  Full   Extension 75% limited* 50% limited pain   25% limited pain  Right lateral flexion WNL  WNL "pulling"  Left lateral flexion WNL*  WNL "pulling"  Right rotation WNL  WNL  Left rotation WNL  WNL   (Blank rows = not tested) * pain     MMT:     MMT Right eval Left eval 08/05/22  Hip flexion 5 5   Hip extension 4 LBP pan 4 LBP pain Rt: 4+; Lt:4  Hip abduction 4 4 Rt: 4+; Lt: 4+  Middle trap 4- 4- 4/5 bilateral   Hip internal rotation       Hip external rotation       Knee flexion       Knee extension       Ankle dorsiflexion       Ankle plantarflexion       Ankle inversion       Ankle eversion         (Blank rows = not tested)   SPECIAL TESTS:  (-) Adams forward bend (+) ely  (+) thomas for hip flexor and quad bilateral    FUNCTIONAL TESTS:  Plank 5 sec   06/30/22: plank: 8 seconds   08/05/22 plank: 15 seconds  GAIT: Distance walked: 10 ft  Assistive device utilized: None Level of assistance: Complete Independence Comments: WNL [facility] Adult PT Treatment:                                                DATE: 08/25/22 Therapeutic Exercise: Elliptical ramp 7, level 5 x 5 minutes  Prone quad stretch with strap x 60 seconds  Squat to table x 10  Hip hinge 2 x 10  Lunge hip flexor stretch x 60 seconds Squats full range attempted unable to control Mini squats 2 x 10  Attempted lizard pose, pigeon pose, supine figure 4 unable due to tightness Seated figure 4 x 30 sec each  Childs pose x 30 sec     [facility] Adult PT Treatment:                                                DATE: 08/12/22 Therapeutic Exercise: Elliptical level 4, ramp 3 x 5 minutes Quadruped leg extension 2 x 10 Bird dog x 10  Squat to table 2 x 10   Seated hip hinge multiple reps Standing hip hinge attempted d/c due to poor form  Seated HS stretch x  30 sec Stability ball rollout x 2 minutes   Fire hydrant 2 x 10     [facility] Adult PT Treatment:                                                DATE: 08/05/22 Therapeutic Exercise: Quadruped arm extension 2 x 10  Quadruped leg extension 2 x 10  Supine serratus punch 2 x 15; 5# Resisted rows blue band 2 x 15  Seated horizontal shoulder abduction red band 2 x 10  Updated HEP   Therapeutic Activity: Re-assessment to determine overall progress, educating patient on progress towards goals.   PATIENT EDUCATION:  Education details: HEP update Person educated: Patient Education method: Explanation, Demonstration, cues, handout Education comprehension: verbalized understanding, returned demo, cues    HOME EXERCISE PROGRAM: Access Code: [email or web address] URL:  [email or web address] Date: 06/03/2022 Prepared by: Letitia Libra   Exercises - Doorway Pec Stretch at 90 Degrees Abduction  - 1 x daily - 7 x weekly - 3 sets - 30 sec  hold - Sidelying Thoracic Rotation with Open Book  - 1 x daily - 7 x weekly - 1 sets - 10 reps - Cat Cow  - 1 x daily - 7 x weekly - 1 sets - 10 reps - Supine Quadriceps Stretch with Strap on Table  - 1 x daily - 7 x weekly - 3 sets - 30 sec  hold - Seated Thoracic Lumbar Extension  - 1 x daily - 7 x weekly - 1 sets - 10 reps   ASSESSMENT:   CLINICAL IMPRESSION: Patient arrives without reports of back pain. Focused on lifting mechanics today with patient demonstrating improved form with hip hinge requiring minimal cues initially to correct anterior pelvic tilt. She has good form with squat to table, but without the support of the table at end range of descent she is unable to complete fluid eccentric/concentric squat through full range. She is able to complete mini squat with proper form and no LOB. Added hip stretching to HEP. No reports of pain at conclusion of session.    OBJECTIVE IMPAIRMENTS: decreased activity tolerance, decreased endurance, decreased knowledge of condition, difficulty walking, decreased ROM, decreased strength, hypomobility, increased fascial restrictions, impaired flexibility, postural dysfunction, and pain.    ACTIVITY LIMITATIONS: carrying, lifting, bending, sitting, standing, squatting, locomotion level, and caring for others   PARTICIPATION LIMITATIONS: meal prep, cleaning, laundry, driving, shopping, and community activity   PERSONAL FACTORS: Fitness and Time since onset of injury/illness/exacerbation are also affecting patient's functional outcome.    REHAB POTENTIAL: Good   CLINICAL DECISION MAKING: Stable/uncomplicated   EVALUATION COMPLEXITY: Low     GOALS: Goals reviewed with patient? Yes   SHORT TERM GOALS: Target date: 07/01/2022       Patient will be independent  and compliant with initial HEP.    Baseline: see above Goal status: met   2.  Patient will improve lumbar extension AROM by at least 25% to improve ability to complete reaching activity.   Baseline: see above  Goal status: met   3.  Patient will maintain plank for at least 15 seconds indicative of improved lumbopelvic stability.  Baseline: see above  Goal status: met    4. Patient will demonstrate knowledge and application of appropriate sitting posture to reduce stress on her back.  Baseline: see above  06/30/22: continues to require postural cues  08/05/22: able to self-correct              Goal status: met       LONG TERM GOALS: Target date: 09/19/22       Patient will demonstrate 4+/5 bilateral middle trap strength to improve postural stability.  Baseline: see above Goal status: ongoing    2.  Patient will demonstrate 5/5 bilateral hip abductor/extensor strength to improve stability about the chain with prolonged walking/standing activity.  Baseline: see above Goal status: ongoing    3.  Patient will self-report tolerating at least 30 minutes of sitting and standing activity to improve her tolerance to playing with her daughter and completing household tasks.  Baseline: see above 08/05/22: "maybe 20 minutes" Goal status: ongoing    4.  Patient will report pain at worst rated as </=4/10 to reduce her current functional limitations.  Baseline: see above 08/05/22: 4/10 at worst Goal status: met   5.  Patient will be independent with advanced home program to assist in management of her chronic condition.    Baseline: n/a Goal status: ongoing      PLAN:   PT FREQUENCY: 1x/week    PT DURATION: 6 weeks   PLANNED INTERVENTIONS: Therapeutic exercises, Therapeutic activity, Neuromuscular re-education, Balance training, Gait training, Patient/Family education, Self Care, Dry Needling, Electrical stimulation, Spinal manipulation, Spinal mobilization, Cryotherapy,  Moist heat, Manual therapy, and Re-evaluation.   PLAN FOR NEXT SESSION: review and progress HEP prn; spinal mobility, work into trunk extension as tolerated, core/hip strengthening, postural strengthening.   Letitia Libra, PT, DPT, ATC 08/25/22 3:29 PM

## 2022-08-27 DIAGNOSIS — F4323 Adjustment disorder with mixed anxiety and depressed mood: Secondary | ICD-10-CM | POA: Diagnosis not present

## 2022-08-28 ENCOUNTER — Ambulatory Visit
Admission: RE | Admit: 2022-08-28 | Discharge: 2022-08-28 | Disposition: A | Discharge status: Home / Self Care | Payer: Medicaid Other | Source: Ambulatory Visit | Attending: Obstetrics and Gynecology | Admitting: Obstetrics and Gynecology

## 2022-08-28 ENCOUNTER — Encounter: Payer: Self-pay | Admitting: Obstetrics and Gynecology

## 2022-08-28 DIAGNOSIS — D259 Leiomyoma of uterus, unspecified: Secondary | ICD-10-CM | POA: Diagnosis not present

## 2022-08-28 DIAGNOSIS — N8003 Adenomyosis of the uterus: Secondary | ICD-10-CM | POA: Diagnosis not present

## 2022-08-28 DIAGNOSIS — D219 Benign neoplasm of connective and other soft tissue, unspecified: Secondary | ICD-10-CM

## 2022-08-28 MED ORDER — GADOPICLENOL 0.5 MMOL/ML IV SOLN
10.0000 mL | Freq: Once | INTRAVENOUS | Status: AC | PRN
  Administered 2022-08-28: 10 mL via INTRAVENOUS

## 2022-09-04 ENCOUNTER — Ambulatory Visit: Payer: Medicaid Other

## 2022-09-09 ENCOUNTER — Ambulatory Visit: Payer: Medicaid Other

## 2022-09-10 DIAGNOSIS — F4323 Adjustment disorder with mixed anxiety and depressed mood: Secondary | ICD-10-CM | POA: Diagnosis not present

## 2022-09-11 ENCOUNTER — Telehealth: Payer: Self-pay | Admitting: Obstetrics and Gynecology

## 2022-09-11 NOTE — Telephone Encounter (Signed)
Called patient and confirmed ID x2.  Discussed MRI results demonstrating very large pedunculated fibroid and other less large fibroid within body of uterus. Recommend open myomectomy for removal. Discussed risks/benefits of procedures and anticipated recover and inpatient stay after surgery. All questions answered.

## 2022-09-16 ENCOUNTER — Inpatient Hospital Stay: Admit: 2022-09-16 | Payer: Medicaid Other | Admitting: Obstetrics and Gynecology

## 2022-09-16 SURGERY — MYOMECTOMY, ROBOT-ASSISTED
Anesthesia: Choice

## 2022-09-17 DIAGNOSIS — F4323 Adjustment disorder with mixed anxiety and depressed mood: Secondary | ICD-10-CM | POA: Diagnosis not present

## 2022-09-22 ENCOUNTER — Ambulatory Visit: Payer: Medicaid Other

## 2022-09-28 NOTE — Therapy (Signed)
OUTPATIENT PHYSICAL THERAPY TREATMENT NOTE/RECERTIFICATION  PHYSICAL THERAPY DISCHARGE SUMMARY  Visits from Start of Care: 10  Current functional level related to goals / functional outcomes: All goals met   Remaining deficits: N/A   Education / Equipment: See education below    Patient agrees to discharge. Patient goals were met. Patient is being discharged due to meeting the stated rehab goals.  Patient Name: Carolyn Cole MRN: [id number] DOB:1991/02/08, 32 y.o., female Today's Date: 09/29/2022  PCP: Conception Oms, FNP  REFERRING PROVIDER: Estill Bamberg, MD    END OF SESSION:   PT End of Session - 09/29/22 1104     Visit Number 10    Number of Visits 13    Date for PT Re-Evaluation 09/19/22    Authorization Type [employer]    PT Start Time 1104    PT Stop Time 1144    PT Time Calculation (min) 40 min    Activity Tolerance Patient tolerated treatment well    Behavior During Therapy WFL for tasks assessed/performed                  Past Medical History:  Diagnosis Date   Anemia    Fibroids    Papilledema, both eyes    Vertigo    Past Surgical History:  Procedure Laterality Date   APPENDECTOMY     Patient Active Problem List   Diagnosis Date Noted   Preeclampsia in postpartum period 02/08/2021   Encounter for induction of labor 01/28/2021   BMI 36.0-36.9,adult 01/27/2021   Fibroids 06/04/2018   Asthma 05/18/2018   Intramural leiomyoma of uterus 05/18/2018   Personal history of MRSA (methicillin resistant Staphylococcus aureus) 05/18/2018    REFERRING DIAG:  Neck, Thoracic , Low back pain    THERAPY DIAG:  Other low back pain  Pain in thoracic spine  Muscle weakness (generalized)  Abnormal posture  Rationale for Evaluation and Treatment Rehabilitation  PERTINENT HISTORY: vertigo, fibroids  PRECAUTIONS: none   SUBJECTIVE:                                                                                                                                                                                       SUBJECTIVE STATEMENT: "My back hasn't been hurting, so that's good. I haven't been having too much back pain. I have been doing the exercises."  PAIN:  Are you having pain?none currently Yes: NPRS scale: 4 at worst/10 Pain location: mid/low back Pain description: stiff Aggravating factors: "not bending right; not stretching."  Relieving factors: hot shower   OBJECTIVE: (objective measures completed at initial evaluation unless otherwise dated) DIAGNOSTIC FINDINGS:  Thoracic X-ray IMPRESSION: Scoliotic curvature of  the midthoracic spine, apex to the right with a Cobb angle of 20 degrees.   Lumbar X-ray: IMPRESSION: 1. Increased sclerosis in L5 and S1 near midline is nonspecific. Recommend a bone scan for further evaluation. If the bone scan is negative, no further follow-up is necessary. If the bone scan is positive, CT or MRI could further assess. 2. No other abnormalities.   PATIENT SURVEYS:  Modified Oswestry 24% disability; 12/50  Modified Oswestry 14% disability; 7/50   SCREENING FOR RED FLAGS: Bowel or bladder incontinence: No Spinal tumors: No Cauda equina syndrome: No Compression fracture: No Abdominal aneurysm: No   COGNITION: Overall cognitive status: Within functional limits for tasks assessed                          SENSATION: Not tested   MUSCLE LENGTH: Hamstrings: WNL bilaterally      POSTURE: rounded shoulders, forward head, and decreased lumbar lordosis   PALPATION: Pain and hypomobility T-spine and L-spine TTP bilateral thoracolumbar paraspinals    LUMBAR ROM:    AROM eval 06/16/22 06/26/22 09/29/22  Flexion WNL  Full  WNL  Extension 75% limited* 50% limited pain   25% limited pain WNL  Right lateral flexion WNL  WNL "pulling" WNL  Left lateral flexion WNL*  WNL "pulling" WNL  Right rotation WNL  WNL WNL  Left rotation WNL  WNL WNL   (Blank rows = not tested) * pain     MMT:      MMT Right eval Left eval 08/05/22 09/29/22  Hip flexion 5 5  5  bilateral   Hip extension 4 LBP pan 4 LBP pain Rt: 4+; Lt:4 5 bilateral  Hip abduction 4 4 Rt: 4+; Lt: 4+ 5 bilateral  Middle trap 4- 4- 4/5 bilateral  4+ bilateral   Hip internal rotation        Hip external rotation        Knee flexion        Knee extension        Ankle dorsiflexion        Ankle plantarflexion        Ankle inversion        Ankle eversion         (Blank rows = not tested)   SPECIAL TESTS:  (-) Adams forward bend (+) ely  (+) thomas for hip flexor and quad bilateral    FUNCTIONAL TESTS:  Plank 5 sec   06/30/22: plank: 8 seconds   08/05/22 plank: 15 seconds  GAIT: Distance walked: 10 ft  Assistive device utilized: None Level of assistance: Complete Independence Comments: WNL [facility] Adult PT Treatment:                                                DATE: 09/29/22 Therapeutic Exercise: Demonstrated and performed advanced HEP that was issued today, discussed frequency, sets, reps, and ways to progress independently.   Therapeutic Activity: Re-assessment to determine overall progress, educating patient on progress towards goals.     [facility] Adult PT Treatment:                                                DATE: 08/25/22 Therapeutic  Exercise: Elliptical ramp 7, level 5 x 5 minutes  Prone quad stretch with strap x 60 seconds  Squat to table x 10  Hip hinge 2 x 10  Lunge hip flexor stretch x 60 seconds Squats full range attempted unable to control Mini squats 2 x 10  Attempted lizard pose, pigeon pose, supine figure 4 unable due to tightness Seated figure 4 x 30 sec each  Childs pose x 30 sec     [facility] Adult PT Treatment:                                                DATE: 08/12/22 Therapeutic Exercise: Elliptical level 4, ramp 3 x 5 minutes Quadruped leg extension 2 x 10 Bird dog x 10  Squat to table 2 x 10   Seated hip hinge multiple reps Standing hip hinge attempted d/c due to poor form   Seated HS stretch x 30 sec Stability ball rollout x 2 minutes   Fire hydrant 2 x 10    PATIENT EDUCATION:  Education details: see treatment; d/c education  Person educated: Patient Education method: Programmer, multimedia, Demonstration, cues, handout Education comprehension: verbalized understanding, returned demo   HOME EXERCISE PROGRAM: Access Code: [email or web address] URL: [email or web address] Date: 09/29/2022 Prepared by: Letitia Libra  Exercises - Doorway Pec Stretch at 90 Degrees Abduction  - 1 x daily - 7 x weekly - 3 sets - 30 sec  hold - Sidelying Thoracic Rotation with Open Book  - 1 x daily - 7 x weekly - 1 sets - 10 reps - Child's Pose Stretch  - 1 x daily - 7 x weekly - 3 sets - 30 sec  hold - Cat Cow  - 1 x daily - 7 x weekly - 1 sets - 10 reps - Seated Figure 4 Piriformis Stretch  - 1 x daily - 7 x weekly - 3 sets - 30 sec  hold - Half Kneeling Hip Flexor Stretch  - 1 x daily - 7 x weekly - 3 sets - 30 sec  hold - Quadruped Full Range Thoracic Rotation with Reach  - 1 x daily - 7 x weekly - 1 sets - 10 reps - Supine Quadriceps Stretch with Strap on Table  - 1 x daily - 7 x weekly - 3 sets - 30 sec  hold - Quadruped Hip Abduction and External Rotation  - 1 x daily - 3 x weekly - 2 sets - 10 reps - Standing Anti-Rotation Press with Anchored Resistance  - 1 x daily - 3 x weekly - 2 sets - 10 reps - Quadruped Alternating Leg Extensions  - 1 x daily - 3 x weekly - 2 sets - 10 reps - Standing Shoulder Row with Anchored Resistance  - 1 x daily - 3 x weekly - 2 sets - 10 reps - Shoulder External Rotation and Scapular Retraction with Resistance  - 1 x daily - 3 x weekly - 2 sets - 10 reps - Standing Shoulder Horizontal Abduction with Resistance  - 1 x daily - 3 x weekly - 2 sets - 10 reps - Squat with Chair Touch  - 1 x daily - 3 x weekly - 2 sets - 10 reps ASSESSMENT:   CLINICAL IMPRESSION: Carolyn Cole has progressed well throughout duration of care reporting an overall improvement  in her back pain. She demonstrates full  and pain free trunk AROM and improvements in hip,core, and periscapular strengthening since the start of care. She has met all established functional goals and demonstrates independence with advanced home program. She is therefore appropriate for discharge at this time with patient in agreement with this plan.    OBJECTIVE IMPAIRMENTS: decreased activity tolerance, decreased endurance, decreased knowledge of condition, difficulty walking, decreased ROM, decreased strength, hypomobility, increased fascial restrictions, impaired flexibility, postural dysfunction, and pain.    ACTIVITY LIMITATIONS: carrying, lifting, bending, sitting, standing, squatting, locomotion level, and caring for others   PARTICIPATION LIMITATIONS: meal prep, cleaning, laundry, driving, shopping, and community activity   PERSONAL FACTORS: Fitness and Time since onset of injury/illness/exacerbation are also affecting patient's functional outcome.    REHAB POTENTIAL: Good   CLINICAL DECISION MAKING: Stable/uncomplicated   EVALUATION COMPLEXITY: Low     GOALS: Goals reviewed with patient? Yes   SHORT TERM GOALS: Target date: 07/01/2022       Patient will be independent and compliant with initial HEP.    Baseline: see above Goal status: met   2.  Patient will improve lumbar extension AROM by at least 25% to improve ability to complete reaching activity.   Baseline: see above  Goal status: met   3.  Patient will maintain plank for at least 15 seconds indicative of improved lumbopelvic stability.  Baseline: see above  Goal status: met    4. Patient will demonstrate knowledge and application of appropriate sitting posture to reduce stress on her back.              Baseline: see above  06/30/22: continues to require postural cues  08/05/22: able to self-correct              Goal status: met       LONG TERM GOALS: Target date: 09/19/22       Patient will demonstrate 4+/5  bilateral middle trap strength to improve postural stability.  Baseline: see above Goal status: met    2.  Patient will demonstrate 5/5 bilateral hip abductor/extensor strength to improve stability about the chain with prolonged walking/standing activity.  Baseline: see above Goal status: met    3.  Patient will self-report tolerating at least 30 minutes of sitting and standing activity to improve her tolerance to playing with her daughter and completing household tasks.  Baseline: see above 08/05/22: "maybe 20 minutes" 09/29/22: oswestry, 1 hour  Goal status: met    4.  Patient will report pain at worst rated as </=4/10 to reduce her current functional limitations.  Baseline: see above 08/05/22: 4/10 at worst Goal status: met   5.  Patient will be independent with advanced home program to assist in management of her chronic condition.    Baseline: n/a Goal status: met      PLAN:   PT FREQUENCY: n/a   PT DURATION: n/a   PLANNED INTERVENTIONS: Therapeutic exercises, Therapeutic activity, Neuromuscular re-education, Balance training, Gait training, Patient/Family education, Self Care, Dry Needling, Electrical stimulation, Spinal manipulation, Spinal mobilization, Cryotherapy, Moist heat, Manual therapy, and Re-evaluation.   PLAN FOR NEXT SESSION: n/a  Letitia Libra, PT, DPT, ATC 09/29/22 1:33 PM

## 2022-09-29 ENCOUNTER — Ambulatory Visit: Payer: Medicaid Other | Attending: Orthopedic Surgery

## 2022-09-29 DIAGNOSIS — M6281 Muscle weakness (generalized): Secondary | ICD-10-CM | POA: Diagnosis present

## 2022-09-29 DIAGNOSIS — R293 Abnormal posture: Secondary | ICD-10-CM | POA: Diagnosis present

## 2022-09-29 DIAGNOSIS — M546 Pain in thoracic spine: Secondary | ICD-10-CM | POA: Insufficient documentation

## 2022-09-29 DIAGNOSIS — M5459 Other low back pain: Secondary | ICD-10-CM | POA: Insufficient documentation

## 2022-10-14 DIAGNOSIS — F4323 Adjustment disorder with mixed anxiety and depressed mood: Secondary | ICD-10-CM | POA: Diagnosis not present

## 2022-10-20 DIAGNOSIS — F4323 Adjustment disorder with mixed anxiety and depressed mood: Secondary | ICD-10-CM | POA: Diagnosis not present

## 2022-10-21 DIAGNOSIS — R202 Paresthesia of skin: Secondary | ICD-10-CM | POA: Diagnosis not present

## 2022-10-21 DIAGNOSIS — Q782 Osteopetrosis: Secondary | ICD-10-CM | POA: Diagnosis not present

## 2022-10-21 DIAGNOSIS — Z6837 Body mass index (BMI) 37.0-37.9, adult: Secondary | ICD-10-CM | POA: Diagnosis not present

## 2022-10-21 DIAGNOSIS — M4134 Thoracogenic scoliosis, thoracic region: Secondary | ICD-10-CM | POA: Diagnosis not present

## 2022-10-21 DIAGNOSIS — E669 Obesity, unspecified: Secondary | ICD-10-CM | POA: Diagnosis not present

## 2022-10-21 DIAGNOSIS — D219 Benign neoplasm of connective and other soft tissue, unspecified: Secondary | ICD-10-CM | POA: Diagnosis not present

## 2022-10-21 DIAGNOSIS — Z833 Family history of diabetes mellitus: Secondary | ICD-10-CM | POA: Diagnosis not present

## 2022-10-29 ENCOUNTER — Encounter: Payer: Self-pay | Admitting: Obstetrics and Gynecology

## 2022-10-29 ENCOUNTER — Ambulatory Visit (INDEPENDENT_AMBULATORY_CARE_PROVIDER_SITE_OTHER): Payer: Medicaid Other | Admitting: Obstetrics and Gynecology

## 2022-10-29 ENCOUNTER — Other Ambulatory Visit: Payer: Self-pay

## 2022-10-29 VITALS — BP 111/74 | HR 70 | Wt 228.5 lb

## 2022-10-29 DIAGNOSIS — D219 Benign neoplasm of connective and other soft tissue, unspecified: Secondary | ICD-10-CM

## 2022-10-29 DIAGNOSIS — N941 Unspecified dyspareunia: Secondary | ICD-10-CM

## 2022-10-29 DIAGNOSIS — N939 Abnormal uterine and vaginal bleeding, unspecified: Secondary | ICD-10-CM

## 2022-10-29 NOTE — H&P (View-Only) (Signed)
OB/GYN Pre-Op History and Physical  Carolyn Cole is a 32 y.o. G1P1001 presenting for AUB-L. Assess pelvic floor after surgery for PFPT. Has been having some increased pain and discomfort.       Past Medical History:  Diagnosis Date   Anemia    Concussion 04/13/2010   Fibroids    Papilledema, both eyes    Vertigo     Past Surgical History:  Procedure Laterality Date   APPENDECTOMY      OB History  Gravida Para Term Preterm AB Living  1 1 1     1   SAB IAB Ectopic Multiple Live Births        0 1    # Outcome Date GA Lbr Len/2nd Weight Sex Type Anes PTL Lv  1 Term 01/29/21 [redacted]w[redacted]d 04:47 / 00:46 7 lb 15 oz (3.6 kg) F Vag-Spont EPI  LIV    Social History   Socioeconomic History   Marital status: Single    Spouse name: Not on file   Number of children: 0   Years of education: Not on file   Highest education level: Master's degree (e.g., MA, MS, MEng, MEd, MSW, MBA)  Occupational History   Not on file  Tobacco Use   Smoking status: Never   Smokeless tobacco: Never  Vaping Use   Vaping status: Never Used  Substance and Sexual Activity   Alcohol use: Not Currently   Drug use: Never   Sexual activity: Not on file  Other Topics Concern   Not on file  Social History Narrative   Caffeine- green tea   Social Determinants of Health   Financial Resource Strain: Not on file  Food Insecurity: Not on file  Transportation Needs: Not on file  Physical Activity: Not on file  Stress: Not on file  Social Connections: Not on file    Family History  Problem Relation Age of Onset   Thyroid disease Mother    Hypertension Mother    Hypertension Father    Ulcers Father    Gout Paternal Grandmother    Chiari malformation Sister     (Not in a hospital admission)   No Known Allergies  Review of Systems: Negative except for what is mentioned in HPI.     Physical Exam: BP 111/74   Pulse 70   Wt 228 lb 8 oz (103.6 kg)   LMP 10/11/2022 (Exact Date)   BMI 39.22 kg/m   CONSTITUTIONAL: Well-developed, well-nourished female in no acute distress.  HENT:  Normocephalic, atraumatic, External right and left ear normal. Oropharynx is clear and moist EYES: Conjunctivae and EOM are normal. Pupils are equal, round, and reactive to light. No scleral icterus.  NECK: Normal range of motion, supple, no masses SKIN: Skin is warm and dry. No rash noted. Not diaphoretic. No erythema. No pallor. NEUROLGIC: Alert and oriented to person, place, and time. Normal reflexes, muscle tone coordination. No cranial nerve deficit noted. PSYCHIATRIC: Normal mood and affect. Normal behavior. Normal judgment and thought content. CARDIOVASCULAR: Normal heart rate noted, regular rhythm RESPIRATORY: Normal effort and breath sounds normal, no problems with respiration noted PELVIC: Deferred   Pertinent Labs/Studies:   MR PELVIS W WO CONTRAST CLINICAL DATA:  Fibroids, surgical planning  EXAM: MRI PELVIS WITHOUT AND WITH CONTRAST  TECHNIQUE: Multiplanar multisequence MR imaging of the pelvis was performed both before and after administration of intravenous contrast.  CONTRAST:  10 mL Vueway gadolinium contrast IV  COMPARISON:  None Available.  FINDINGS: Urinary Tract:  No abnormality  visualized.  Bowel:  Unremarkable visualized pelvic bowel loops.  Vascular/Lymphatic: No pathologically enlarged lymph nodes. No significant vascular abnormality seen.  Reproductive: Bulky uterine fibroids, including multiple very large exophytic or pedunculated fibroids. Overall dimensions of the uterus, including extent of pedunculated fibroids is at least 20.2 x 9.1 x 14.4 cm (series 5, image 20, series 3, image 13). Largest fibroid is a very bulky, exophytic or pedunculated fibroid of the uterine fundus measuring 14.4 x 7.8 x 9.5 cm (series 5, image 17). This and the majority of other fibroids are degenerative appearing and without internal contrast enhancement (series 15, image 31).  The endometrial cavity is mildly distorted, however this is predominantly due to fibroid bulk and there do not appear to be significant submucosal fibroids. Underlying adenomyosis.  Other:  None.  Musculoskeletal: No suspicious bone lesions identified.  IMPRESSION: 1. Bulky uterine fibroids, including multiple very large exophytic or pedunculated fibroids extending well into the abdomen. Overall dimensions of the uterus, including extent of pedunculated fibroids is at least 20.2 x 9.1 x 14.4 cm. 2. Largest fibroid is a very bulky, exophytic or pedunculated fibroid of the uterine fundus measuring 14.4 x 7.8 x 9.5 cm. This and the majority of other fibroids are degenerative appearing and without internal contrast enhancement. 3. The endometrial cavity is mildly distorted, however this is predominantly due to fibroid bulk and there do not appear to be significant submucosal fibroids. 4. Underlying uterine adenomyosis.  Electronically Signed   By: Jearld Lesch M.D.   On: 08/28/2022 11:11       Assessment and Plan :Carolyn Cole is a 32 y.o. G1P1001 here for preop visit for open myomectomy.   Patient desires surgical management with open myomectomy.  The risks of surgery were discussed in detail with the patient including but not limited to: bleeding which may require transfusion or reoperation; infection which may require prolonged hospitalization or re-hospitalization and antibiotic therapy; injury to bowel, bladder, ureters and major vessels or other surrounding organs which may lead to other procedures; formation of adhesions; need for additional procedures including laparotomy or subsequent procedures secondary to intraoperative injury or abnormal pathology; thromboembolic phenomenon; incisional problems and other postoperative or anesthesia complications.  Patient was told that the likelihood that her condition and symptoms will be treated effectively with this surgical management  was high; the postoperative expectations were also discussed in detail. The patient also understands the alternative treatment options which were discussed in full. All questions were answered.     Lorriane Shire, M.D. Minimally Invasive Gynecologic Surgery and Pelvic Pain Specialist Attending Obstetrician & Gynecologist, [facility] [employer], [facility]

## 2022-10-29 NOTE — Progress Notes (Signed)
OB/GYN Pre-Op History and Physical  Carolyn Cole is a 32 y.o. G1P1001 presenting for AUB-L. Assess pelvic floor after surgery for PFPT. Has been having some increased pain and discomfort.       Past Medical History:  Diagnosis Date   Anemia    Concussion 04/13/2010   Fibroids    Papilledema, both eyes    Vertigo     Past Surgical History:  Procedure Laterality Date   APPENDECTOMY      OB History  Gravida Para Term Preterm AB Living  1 1 1     1   SAB IAB Ectopic Multiple Live Births        0 1    # Outcome Date GA Lbr Len/2nd Weight Sex Type Anes PTL Lv  1 Term 01/29/21 [redacted]w[redacted]d 04:47 / 00:46 7 lb 15 oz (3.6 kg) F Vag-Spont EPI  LIV    Social History   Socioeconomic History   Marital status: Single    Spouse name: Not on file   Number of children: 0   Years of education: Not on file   Highest education level: Master's degree (e.g., MA, MS, MEng, MEd, MSW, MBA)  Occupational History   Not on file  Tobacco Use   Smoking status: Never   Smokeless tobacco: Never  Vaping Use   Vaping status: Never Used  Substance and Sexual Activity   Alcohol use: Not Currently   Drug use: Never   Sexual activity: Not on file  Other Topics Concern   Not on file  Social History Narrative   Caffeine- green tea   Social Determinants of Health   Financial Resource Strain: Not on file  Food Insecurity: Not on file  Transportation Needs: Not on file  Physical Activity: Not on file  Stress: Not on file  Social Connections: Not on file    Family History  Problem Relation Age of Onset   Thyroid disease Mother    Hypertension Mother    Hypertension Father    Ulcers Father    Gout Paternal Grandmother    Chiari malformation Sister     (Not in a hospital admission)   No Known Allergies  Review of Systems: Negative except for what is mentioned in HPI.     Physical Exam: BP 111/74   Pulse 70   Wt 228 lb 8 oz (103.6 kg)   LMP 10/11/2022 (Exact Date)   BMI 39.22 kg/m   CONSTITUTIONAL: Well-developed, well-nourished female in no acute distress.  HENT:  Normocephalic, atraumatic, External right and left ear normal. Oropharynx is clear and moist EYES: Conjunctivae and EOM are normal. Pupils are equal, round, and reactive to light. No scleral icterus.  NECK: Normal range of motion, supple, no masses SKIN: Skin is warm and dry. No rash noted. Not diaphoretic. No erythema. No pallor. NEUROLGIC: Alert and oriented to person, place, and time. Normal reflexes, muscle tone coordination. No cranial nerve deficit noted. PSYCHIATRIC: Normal mood and affect. Normal behavior. Normal judgment and thought content. CARDIOVASCULAR: Normal heart rate noted, regular rhythm RESPIRATORY: Normal effort and breath sounds normal, no problems with respiration noted PELVIC: Deferred   Pertinent Labs/Studies:   MR PELVIS W WO CONTRAST CLINICAL DATA:  Fibroids, surgical planning  EXAM: MRI PELVIS WITHOUT AND WITH CONTRAST  TECHNIQUE: Multiplanar multisequence MR imaging of the pelvis was performed both before and after administration of intravenous contrast.  CONTRAST:  10 mL Vueway gadolinium contrast IV  COMPARISON:  None Available.  FINDINGS: Urinary Tract:  No abnormality  visualized.  Bowel:  Unremarkable visualized pelvic bowel loops.  Vascular/Lymphatic: No pathologically enlarged lymph nodes. No significant vascular abnormality seen.  Reproductive: Bulky uterine fibroids, including multiple very large exophytic or pedunculated fibroids. Overall dimensions of the uterus, including extent of pedunculated fibroids is at least 20.2 x 9.1 x 14.4 cm (series 5, image 20, series 3, image 13). Largest fibroid is a very bulky, exophytic or pedunculated fibroid of the uterine fundus measuring 14.4 x 7.8 x 9.5 cm (series 5, image 17). This and the majority of other fibroids are degenerative appearing and without internal contrast enhancement (series 15, image 31).  The endometrial cavity is mildly distorted, however this is predominantly due to fibroid bulk and there do not appear to be significant submucosal fibroids. Underlying adenomyosis.  Other:  None.  Musculoskeletal: No suspicious bone lesions identified.  IMPRESSION: 1. Bulky uterine fibroids, including multiple very large exophytic or pedunculated fibroids extending well into the abdomen. Overall dimensions of the uterus, including extent of pedunculated fibroids is at least 20.2 x 9.1 x 14.4 cm. 2. Largest fibroid is a very bulky, exophytic or pedunculated fibroid of the uterine fundus measuring 14.4 x 7.8 x 9.5 cm. This and the majority of other fibroids are degenerative appearing and without internal contrast enhancement. 3. The endometrial cavity is mildly distorted, however this is predominantly due to fibroid bulk and there do not appear to be significant submucosal fibroids. 4. Underlying uterine adenomyosis.  Electronically Signed   By: Jearld Lesch M.D.   On: 08/28/2022 11:11       Assessment and Plan :Carolyn Cole is a 32 y.o. G1P1001 here for preop visit for open myomectomy.   Patient desires surgical management with open myomectomy.  The risks of surgery were discussed in detail with the patient including but not limited to: bleeding which may require transfusion or reoperation; infection which may require prolonged hospitalization or re-hospitalization and antibiotic therapy; injury to bowel, bladder, ureters and major vessels or other surrounding organs which may lead to other procedures; formation of adhesions; need for additional procedures including laparotomy or subsequent procedures secondary to intraoperative injury or abnormal pathology; thromboembolic phenomenon; incisional problems and other postoperative or anesthesia complications.  Patient was told that the likelihood that her condition and symptoms will be treated effectively with this surgical management  was high; the postoperative expectations were also discussed in detail. The patient also understands the alternative treatment options which were discussed in full. All questions were answered.     Lorriane Shire, M.D. Minimally Invasive Gynecologic Surgery and Pelvic Pain Specialist Attending Obstetrician & Gynecologist, [facility] for [employer], [facility]

## 2022-11-03 NOTE — Pre-Procedure Instructions (Signed)
Surgical Instructions   Your procedure is scheduled on November 11, 2022. Report to [facility] Main Entrance "A" at 5:30 A.M., then check in with the Admitting office. Any questions or running late day of surgery: call [phone]  Questions prior to your surgery date: call [phone], Monday-Friday, 8am-4pm. If you experience any cold or flu symptoms such as cough, fever, chills, shortness of breath, etc. between now and your scheduled surgery, please notify us at the above number.     Remember:  Do not eat after midnight the night before your surgery   You may drink clear liquids until 4:30 AM the morning of your surgery.   Clear liquids allowed are: Water, Non-Citrus Juices (without pulp), Carbonated Beverages, Clear Tea, Black Coffee Only (NO MILK, CREAM OR POWDERED CREAMER of any kind), and Gatorade.     Take these medicines the morning of surgery with A SIP OF WATER: NONE    One week prior to surgery, STOP taking any Aspirin (unless otherwise instructed by your surgeon) Aleve, Naproxen, Ibuprofen, Motrin, Advil, Goody's, BC's, all herbal medications, fish oil, and non-prescription vitamins.                     Do NOT Smoke (Tobacco/Vaping) for 24 hours prior to your procedure.  If you use a CPAP at night, you may bring your mask/headgear for your overnight stay.   You will be asked to remove any contacts, glasses, piercing's, hearing aid's, dentures/partials prior to surgery. Please bring cases for these items if needed.    Patients discharged the day of surgery will not be allowed to drive home, and someone needs to stay with them for 24 hours.  SURGICAL WAITING ROOM VISITATION Patients may have no more than 2 support people in the waiting area - these visitors may rotate.   Pre-op nurse will coordinate an appropriate time for 1 ADULT support person, who may not rotate, to accompany patient in pre-op.  Children under the age of 33 must have an adult with them who is not  the patient and must remain in the main waiting area with an adult.  If the patient needs to stay at the hospital during part of their recovery, the visitor guidelines for inpatient rooms apply.  Please refer to the [facility] website for the visitor guidelines for any additional information.   If you received a COVID test during your pre-op visit  it is requested that you wear a mask when out in public, stay away from anyone that may not be feeling well and notify your surgeon if you develop symptoms. If you have been in contact with anyone that has tested positive in the last 10 days please notify you surgeon.      Pre-operative CHG Bathing Instructions   You can play a key role in reducing the risk of infection after surgery. Your skin needs to be as free of germs as possible. You can reduce the number of germs on your skin by washing with CHG (chlorhexidine gluconate) soap before surgery. CHG is an antiseptic soap that kills germs and continues to kill germs even after washing.   DO NOT use if you have an allergy to chlorhexidine/CHG or antibacterial soaps. If your skin becomes reddened or irritated, stop using the CHG and notify one of our RNs at [phone].              TAKE A SHOWER THE NIGHT BEFORE SURGERY AND THE DAY OF SURGERY  Please keep in mind the following:  DO NOT shave, including legs and underarms, 48 hours prior to surgery.   You may shave your face before/day of surgery.  Place clean sheets on your bed the night before surgery Use a clean washcloth (not used since being washed) for each shower. DO NOT sleep with pet's night before surgery.  CHG Shower Instructions:  If you choose to wash your hair and private area, wash first with your normal shampoo/soap.  After you use shampoo/soap, rinse your hair and body thoroughly to remove shampoo/soap residue.  Turn the water OFF and apply half the bottle of CHG soap to a CLEAN washcloth.  Apply CHG soap ONLY FROM YOUR  NECK DOWN TO YOUR TOES (washing for 3-5 minutes)  DO NOT use CHG soap on face, private areas, open wounds, or sores.  Pay special attention to the area where your surgery is being performed.  If you are having back surgery, having someone wash your back for you may be helpful. Wait 2 minutes after CHG soap is applied, then you may rinse off the CHG soap.  Pat dry with a clean towel  Put on clean pajamas    Additional instructions for the day of surgery: DO NOT APPLY any lotions, deodorants, cologne, or perfumes.   Do not wear jewelry or makeup Do not wear nail polish, gel polish, artificial nails, or any other type of covering on natural nails (fingers and toes) Do not bring valuables to the hospital. [facility] is not responsible for valuables/personal belongings. Put on clean/comfortable clothes.  Please brush your teeth.  Ask your nurse before applying any prescription medications to the skin.

## 2022-11-04 ENCOUNTER — Other Ambulatory Visit (HOSPITAL_BASED_OUTPATIENT_CLINIC_OR_DEPARTMENT_OTHER): Payer: Self-pay | Admitting: Obstetrics & Gynecology

## 2022-11-04 ENCOUNTER — Encounter (HOSPITAL_COMMUNITY)
Admission: RE | Admit: 2022-11-04 | Discharge: 2022-11-04 | Disposition: A | Discharge status: Home / Self Care | Payer: Medicaid Other | Source: Ambulatory Visit | Attending: Obstetrics and Gynecology | Admitting: Obstetrics and Gynecology

## 2022-11-04 ENCOUNTER — Encounter (HOSPITAL_COMMUNITY): Payer: Self-pay

## 2022-11-04 ENCOUNTER — Other Ambulatory Visit: Payer: Self-pay

## 2022-11-04 VITALS — BP 122/82 | HR 92 | Temp 98.2°F | Resp 18 | Ht 64.0 in | Wt 226.7 lb

## 2022-11-04 DIAGNOSIS — D251 Intramural leiomyoma of uterus: Secondary | ICD-10-CM

## 2022-11-04 DIAGNOSIS — D219 Benign neoplasm of connective and other soft tissue, unspecified: Secondary | ICD-10-CM

## 2022-11-04 DIAGNOSIS — Z01818 Encounter for other preprocedural examination: Secondary | ICD-10-CM | POA: Diagnosis present

## 2022-11-04 HISTORY — DX: Cardiac murmur, unspecified: R01.1

## 2022-11-04 HISTORY — DX: Anxiety disorder, unspecified: F41.9

## 2022-11-04 LAB — CBC
HCT: 40.8 % (ref 36.0–46.0)
Hemoglobin: 14 g/dL (ref 12.0–15.0)
MCH: 32.3 pg (ref 26.0–34.0)
MCHC: 34.3 g/dL (ref 30.0–36.0)
MCV: 94 fL (ref 80.0–100.0)
Platelets: 239 10*3/uL (ref 150–400)
RBC: 4.34 MIL/uL (ref 3.87–5.11)
RDW: 12.9 % (ref 11.5–15.5)
WBC: 4.4 10*3/uL (ref 4.0–10.5)
nRBC: 0 % (ref 0.0–0.2)

## 2022-11-04 LAB — TYPE AND SCREEN
ABO/RH(D): O POS
Antibody Screen: NEGATIVE

## 2022-11-04 MED ORDER — SODIUM CHLORIDE 0.9 % IV SOLN: 2.0000 g | Freq: Once | INTRAVENOUS | Status: DC

## 2022-11-04 NOTE — Progress Notes (Signed)
PCP - Dr. Ardean Larsen Cardiologist - Denies  PPM/ICD - Denies Device Orders - n/a Rep Notified - n/a  Chest x-ray - Denies EKG - Denies Stress Test - Denies ECHO - Denies Cardiac Cath - Denies  Sleep Study - Denies. Pt stated that she was supposed to have one done, but insurance wouldn't cover it so she never completed testing. CPAP - n/a  No DM  Last dose of GLP1 agonist- n/a GLP1 instructions: n/a  Blood Thinner Instructions: n/a Aspirin Instructions: n/a  ERAS Protcol - Clear liquids until 0430 morning of surgery PRE-SURGERY Ensure or G2- n/a  COVID TEST- n/a   Anesthesia review: No.   Patient denies shortness of breath, fever, cough and chest pain at PAT appointment. Pt denies any respiratory illness/infection in the last two months.   All instructions explained to the patient, with a verbal understanding of the material. Patient agrees to go over the instructions while at home for a better understanding. Patient also instructed to self quarantine after being tested for COVID-19. The opportunity to ask questions was provided.

## 2022-11-10 DIAGNOSIS — F4323 Adjustment disorder with mixed anxiety and depressed mood: Secondary | ICD-10-CM | POA: Diagnosis not present

## 2022-11-11 ENCOUNTER — Encounter (HOSPITAL_COMMUNITY)
Admission: RE | Disposition: A | Discharge status: Home / Self Care | Payer: Self-pay | Source: Home / Self Care | Attending: Obstetrics and Gynecology

## 2022-11-11 ENCOUNTER — Other Ambulatory Visit: Payer: Self-pay

## 2022-11-11 ENCOUNTER — Inpatient Hospital Stay (HOSPITAL_COMMUNITY)
Admission: RE | Admit: 2022-11-11 | Discharge: 2022-11-13 | DRG: 743 | Disposition: A | Discharge status: Home / Self Care | Payer: Medicaid Other | Attending: Obstetrics and Gynecology | Admitting: Obstetrics and Gynecology

## 2022-11-11 ENCOUNTER — Inpatient Hospital Stay (HOSPITAL_COMMUNITY): Payer: Medicaid Other

## 2022-11-11 ENCOUNTER — Inpatient Hospital Stay (HOSPITAL_COMMUNITY): Payer: Medicaid Other | Admitting: Vascular Surgery

## 2022-11-11 ENCOUNTER — Encounter (HOSPITAL_COMMUNITY): Payer: Self-pay | Admitting: Obstetrics and Gynecology

## 2022-11-11 DIAGNOSIS — D259 Leiomyoma of uterus, unspecified: Secondary | ICD-10-CM

## 2022-11-11 DIAGNOSIS — K660 Peritoneal adhesions (postprocedural) (postinfection): Secondary | ICD-10-CM | POA: Diagnosis present

## 2022-11-11 DIAGNOSIS — D252 Subserosal leiomyoma of uterus: Secondary | ICD-10-CM | POA: Diagnosis present

## 2022-11-11 DIAGNOSIS — Z8249 Family history of ischemic heart disease and other diseases of the circulatory system: Secondary | ICD-10-CM | POA: Diagnosis not present

## 2022-11-11 DIAGNOSIS — D219 Benign neoplasm of connective and other soft tissue, unspecified: Secondary | ICD-10-CM | POA: Diagnosis not present

## 2022-11-11 DIAGNOSIS — G8918 Other acute postprocedural pain: Secondary | ICD-10-CM | POA: Diagnosis not present

## 2022-11-11 DIAGNOSIS — D251 Intramural leiomyoma of uterus: Principal | ICD-10-CM

## 2022-11-11 DIAGNOSIS — Z01818 Encounter for other preprocedural examination: Secondary | ICD-10-CM

## 2022-11-11 DIAGNOSIS — Z8349 Family history of other endocrine, nutritional and metabolic diseases: Secondary | ICD-10-CM | POA: Diagnosis not present

## 2022-11-11 DIAGNOSIS — Z9049 Acquired absence of other specified parts of digestive tract: Secondary | ICD-10-CM

## 2022-11-11 DIAGNOSIS — Z8614 Personal history of Methicillin resistant Staphylococcus aureus infection: Secondary | ICD-10-CM | POA: Diagnosis not present

## 2022-11-11 HISTORY — PX: MYOMECTOMY: SHX85

## 2022-11-11 LAB — POCT PREGNANCY, URINE: Preg Test, Ur: NEGATIVE

## 2022-11-11 SURGERY — MYOMECTOMY, ABDOMINAL APPROACH
Anesthesia: Regional | Site: Abdomen

## 2022-11-11 MED ORDER — PROPOFOL 10 MG/ML IV BOLUS
INTRAVENOUS | Status: DC | PRN
  Administered 2022-11-11: 160 mg via INTRAVENOUS

## 2022-11-11 MED ORDER — ONDANSETRON HCL 4 MG/2ML IJ SOLN
INTRAMUSCULAR | Status: AC
  Filled 2022-11-11: qty 2

## 2022-11-11 MED ORDER — PROPOFOL 10 MG/ML IV BOLUS
INTRAVENOUS | Status: AC
  Filled 2022-11-11: qty 20

## 2022-11-11 MED ORDER — POVIDONE-IODINE 10 % EX SWAB: 2.0000 | Freq: Once | CUTANEOUS | Status: DC

## 2022-11-11 MED ORDER — IBUPROFEN 600 MG PO TABS
600.0000 mg | ORAL_TABLET | Freq: Four times a day (QID) | ORAL | Status: DC
  Administered 2022-11-12 – 2022-11-13 (×4): 600 mg via ORAL
  Filled 2022-11-11 (×4): qty 1

## 2022-11-11 MED ORDER — KETOROLAC TROMETHAMINE 30 MG/ML IJ SOLN
INTRAMUSCULAR | Status: AC
  Filled 2022-11-11: qty 1

## 2022-11-11 MED ORDER — LIDOCAINE 2% (20 MG/ML) 5 ML SYRINGE
INTRAMUSCULAR | Status: AC
  Filled 2022-11-11: qty 5

## 2022-11-11 MED ORDER — HEMOSTATIC AGENTS (NO CHARGE) OPTIME
TOPICAL | Status: DC | PRN
  Administered 2022-11-11: 1 via TOPICAL

## 2022-11-11 MED ORDER — SUCCINYLCHOLINE CHLORIDE 200 MG/10ML IV SOSY
PREFILLED_SYRINGE | INTRAVENOUS | Status: AC
  Filled 2022-11-11: qty 10

## 2022-11-11 MED ORDER — ORAL CARE MOUTH RINSE: 15.0000 mL | Freq: Once | OROMUCOSAL | Status: AC

## 2022-11-11 MED ORDER — PHENYLEPHRINE 80 MCG/ML (10ML) SYRINGE FOR IV PUSH (FOR BLOOD PRESSURE SUPPORT)
PREFILLED_SYRINGE | INTRAVENOUS | Status: AC
  Filled 2022-11-11: qty 10

## 2022-11-11 MED ORDER — SODIUM CHLORIDE (PF) 0.9 % IJ SOLN
INTRAMUSCULAR | Status: AC
  Filled 2022-11-11: qty 200

## 2022-11-11 MED ORDER — ONDANSETRON HCL 4 MG/2ML IJ SOLN: 4.0000 mg | Freq: Four times a day (QID) | INTRAMUSCULAR | Status: DC | PRN

## 2022-11-11 MED ORDER — MIDAZOLAM HCL 5 MG/5ML IJ SOLN
INTRAMUSCULAR | Status: DC | PRN
  Administered 2022-11-11: 2 mg via INTRAVENOUS

## 2022-11-11 MED ORDER — SODIUM CHLORIDE 0.9 % IV SOLN
INTRAVENOUS | Status: AC
  Filled 2022-11-11: qty 2

## 2022-11-11 MED ORDER — CHLORHEXIDINE GLUCONATE 0.12 % MT SOLN
15.0000 mL | Freq: Once | OROMUCOSAL | Status: AC
  Administered 2022-11-11: 15 mL via OROMUCOSAL
  Filled 2022-11-11: qty 15

## 2022-11-11 MED ORDER — SUGAMMADEX SODIUM 200 MG/2ML IV SOLN
INTRAVENOUS | Status: DC | PRN
  Administered 2022-11-11: 200 mg via INTRAVENOUS

## 2022-11-11 MED ORDER — LACTATED RINGERS IV SOLN: INTRAVENOUS | Status: DC

## 2022-11-11 MED ORDER — LIDOCAINE 2% (20 MG/ML) 5 ML SYRINGE
INTRAMUSCULAR | Status: DC | PRN
  Administered 2022-11-11: 40 mg via INTRAVENOUS

## 2022-11-11 MED ORDER — VASOPRESSIN 20 UNIT/ML IV SOLN
INTRAVENOUS | Status: DC | PRN
  Administered 2022-11-11: 45 mL

## 2022-11-11 MED ORDER — MENTHOL 3 MG MT LOZG: 1.0000 | LOZENGE | OROMUCOSAL | Status: DC | PRN

## 2022-11-11 MED ORDER — OXYCODONE HCL 5 MG PO TABS
5.0000 mg | ORAL_TABLET | ORAL | Status: DC | PRN
  Administered 2022-11-12: 10 mg via ORAL
  Administered 2022-11-12: 5 mg via ORAL
  Filled 2022-11-11: qty 1
  Filled 2022-11-11: qty 2

## 2022-11-11 MED ORDER — HYDROMORPHONE HCL 1 MG/ML IJ SOLN
0.2000 mg | INTRAMUSCULAR | Status: DC | PRN
  Administered 2022-11-11: 0.5 mg via INTRAVENOUS
  Filled 2022-11-11: qty 1

## 2022-11-11 MED ORDER — DEXAMETHASONE SODIUM PHOSPHATE 10 MG/ML IJ SOLN
INTRAMUSCULAR | Status: AC
  Filled 2022-11-11: qty 1

## 2022-11-11 MED ORDER — KETOROLAC TROMETHAMINE 30 MG/ML IJ SOLN
INTRAMUSCULAR | Status: DC | PRN
Start: 2022-11-11 — End: 2022-11-11
  Administered 2022-11-11: 30 mg via INTRAVENOUS

## 2022-11-11 MED ORDER — SODIUM CHLORIDE 0.9 % IV SOLN: INTRAVENOUS | Status: DC | PRN

## 2022-11-11 MED ORDER — OXYCODONE HCL 5 MG PO TABS: 5.0000 mg | ORAL_TABLET | Freq: Once | ORAL | Status: DC | PRN

## 2022-11-11 MED ORDER — POLYETHYLENE GLYCOL 3350 17 G PO PACK: 17.0000 g | PACK | Freq: Every day | ORAL | Status: DC | PRN

## 2022-11-11 MED ORDER — HYDROMORPHONE HCL 1 MG/ML IJ SOLN
INTRAMUSCULAR | Status: AC
  Filled 2022-11-11: qty 0.5

## 2022-11-11 MED ORDER — ONDANSETRON HCL 4 MG PO TABS: 4.0000 mg | ORAL_TABLET | Freq: Four times a day (QID) | ORAL | Status: DC | PRN

## 2022-11-11 MED ORDER — ROCURONIUM BROMIDE 10 MG/ML (PF) SYRINGE
PREFILLED_SYRINGE | INTRAVENOUS | Status: AC
  Filled 2022-11-11: qty 10

## 2022-11-11 MED ORDER — 0.9 % SODIUM CHLORIDE (POUR BTL) OPTIME
TOPICAL | Status: DC | PRN
  Administered 2022-11-11: 1000 mL

## 2022-11-11 MED ORDER — ACETAMINOPHEN 500 MG PO TABS
1000.0000 mg | ORAL_TABLET | ORAL | Status: AC
  Administered 2022-11-11: 1000 mg via ORAL
  Filled 2022-11-11: qty 2

## 2022-11-11 MED ORDER — SODIUM CHLORIDE 0.9 % IV SOLN
INTRAVENOUS | Status: DC | PRN
  Administered 2022-11-11: 2 g via INTRAVENOUS

## 2022-11-11 MED ORDER — FENTANYL CITRATE (PF) 250 MCG/5ML IJ SOLN
INTRAMUSCULAR | Status: AC
  Filled 2022-11-11: qty 5

## 2022-11-11 MED ORDER — DOCUSATE SODIUM 100 MG PO CAPS
100.0000 mg | ORAL_CAPSULE | Freq: Two times a day (BID) | ORAL | Status: DC
  Administered 2022-11-12 – 2022-11-13 (×3): 100 mg via ORAL
  Filled 2022-11-11 (×3): qty 1

## 2022-11-11 MED ORDER — KETOROLAC TROMETHAMINE 30 MG/ML IJ SOLN
30.0000 mg | Freq: Four times a day (QID) | INTRAMUSCULAR | Status: AC
  Administered 2022-11-11 – 2022-11-12 (×4): 30 mg via INTRAVENOUS
  Filled 2022-11-11 (×4): qty 1

## 2022-11-11 MED ORDER — HYDROMORPHONE HCL 1 MG/ML IJ SOLN
INTRAMUSCULAR | Status: DC | PRN
  Administered 2022-11-11: .5 mg via INTRAVENOUS

## 2022-11-11 MED ORDER — FENTANYL CITRATE (PF) 100 MCG/2ML IJ SOLN
25.0000 ug | INTRAMUSCULAR | Status: DC | PRN
  Administered 2022-11-11: 50 ug via INTRAVENOUS

## 2022-11-11 MED ORDER — FENTANYL CITRATE (PF) 100 MCG/2ML IJ SOLN
INTRAMUSCULAR | Status: AC
  Filled 2022-11-11: qty 2

## 2022-11-11 MED ORDER — FENTANYL CITRATE (PF) 250 MCG/5ML IJ SOLN
INTRAMUSCULAR | Status: DC | PRN
  Administered 2022-11-11: 100 ug via INTRAVENOUS
  Administered 2022-11-11: 50 ug via INTRAVENOUS

## 2022-11-11 MED ORDER — VASOPRESSIN 20 UNIT/ML IV SOLN
INTRAVENOUS | Status: AC
  Filled 2022-11-11: qty 1

## 2022-11-11 MED ORDER — BUPIVACAINE HCL (PF) 0.5 % IJ SOLN
INTRAMUSCULAR | Status: AC
  Filled 2022-11-11: qty 30

## 2022-11-11 MED ORDER — MIDAZOLAM HCL 2 MG/2ML IJ SOLN
INTRAMUSCULAR | Status: AC
  Filled 2022-11-11: qty 2

## 2022-11-11 MED ORDER — EPHEDRINE 5 MG/ML INJ
INTRAVENOUS | Status: AC
  Filled 2022-11-11: qty 5

## 2022-11-11 MED ORDER — ROCURONIUM BROMIDE 10 MG/ML (PF) SYRINGE
PREFILLED_SYRINGE | INTRAVENOUS | Status: DC | PRN
  Administered 2022-11-11: 20 mg via INTRAVENOUS
  Administered 2022-11-11: 80 mg via INTRAVENOUS

## 2022-11-11 MED ORDER — SIMETHICONE 80 MG PO CHEW: 80.0000 mg | CHEWABLE_TABLET | Freq: Four times a day (QID) | ORAL | Status: DC | PRN

## 2022-11-11 MED ORDER — ACETAMINOPHEN 500 MG PO TABS
1000.0000 mg | ORAL_TABLET | Freq: Four times a day (QID) | ORAL | Status: DC
  Administered 2022-11-11 – 2022-11-13 (×7): 1000 mg via ORAL
  Filled 2022-11-11 (×8): qty 2

## 2022-11-11 MED ORDER — PHENYLEPHRINE HCL (PRESSORS) 10 MG/ML IV SOLN
INTRAVENOUS | Status: DC | PRN
Start: 2022-11-11 — End: 2022-11-11
  Administered 2022-11-11 (×5): 80 ug via INTRAVENOUS

## 2022-11-11 MED ORDER — ATROPINE SULFATE 0.4 MG/ML IV SOLN
INTRAVENOUS | Status: AC
  Filled 2022-11-11: qty 1

## 2022-11-11 MED ORDER — ONDANSETRON HCL 4 MG/2ML IJ SOLN
INTRAMUSCULAR | Status: DC | PRN
  Administered 2022-11-11: 4 mg via INTRAVENOUS

## 2022-11-11 MED ORDER — ROPIVACAINE HCL 5 MG/ML IJ SOLN
INTRAMUSCULAR | Status: DC | PRN
  Administered 2022-11-11 (×2): 20 mL via PERINEURAL

## 2022-11-11 MED ORDER — DEXAMETHASONE SODIUM PHOSPHATE 10 MG/ML IJ SOLN
INTRAMUSCULAR | Status: DC | PRN
  Administered 2022-11-11: 10 mg via INTRAVENOUS

## 2022-11-11 MED ORDER — OXYCODONE HCL 5 MG/5ML PO SOLN: 5.0000 mg | Freq: Once | ORAL | Status: DC | PRN

## 2022-11-11 MED ORDER — BISACODYL 5 MG PO TBEC
5.0000 mg | DELAYED_RELEASE_TABLET | Freq: Every day | ORAL | Status: DC | PRN
  Administered 2022-11-12: 5 mg via ORAL
  Filled 2022-11-11: qty 1

## 2022-11-11 SURGICAL SUPPLY — 43 items
BAG COUNTER SPONGE SURGICOUNT (BAG) ×1 IMPLANT
BAG SPNG CNTER NS LX DISP (BAG) ×1
CANISTER SUCT 3000ML PPV (MISCELLANEOUS) ×1 IMPLANT
DRAPE CESAREAN BIRTH W POUCH (DRAPES) ×1 IMPLANT
DRSG OPSITE POSTOP 4X10 (GAUZE/BANDAGES/DRESSINGS) ×1 IMPLANT
DURAPREP 26ML APPLICATOR (WOUND CARE) ×1 IMPLANT
GAUZE 4X4 16PLY ~~LOC~~+RFID DBL (SPONGE) IMPLANT
GAUZE PAD ABD 8X10 STRL (GAUZE/BANDAGES/DRESSINGS) IMPLANT
GLOVE BIOGEL PI IND STRL 7.0 (GLOVE) ×3 IMPLANT
GLOVE ECLIPSE 7.0 STRL STRAW (GLOVE) ×1 IMPLANT
GOWN STRL REUS W/ TWL LRG LVL3 (GOWN DISPOSABLE) ×2 IMPLANT
GOWN STRL REUS W/TWL LRG LVL3 (GOWN DISPOSABLE) ×3
HEMOSTAT SURGICEL NU-KNIT 6X9 (HEMOSTASIS) IMPLANT
KIT TURNOVER KIT B (KITS) ×1 IMPLANT
NDL HYPO 22X1.5 SAFETY MO (MISCELLANEOUS) ×2 IMPLANT
NEEDLE HYPO 22X1.5 SAFETY MO (MISCELLANEOUS) ×1
NS IRRIG 1000ML POUR BTL (IV SOLUTION) ×1 IMPLANT
PACK ABDOMINAL GYN (CUSTOM PROCEDURE TRAY) ×1 IMPLANT
PAD ARMBOARD 7.5X6 YLW CONV (MISCELLANEOUS) ×1 IMPLANT
PAD OB MATERNITY 4.3X12.25 (PERSONAL CARE ITEMS) ×1 IMPLANT
PENCIL SMOKE EVACUATOR (MISCELLANEOUS) ×1 IMPLANT
RETRACTOR WND ALEXIS 18 MED (MISCELLANEOUS) IMPLANT
RTRCTR WOUND ALEXIS 18CM MED (MISCELLANEOUS)
SPECIMEN JAR MEDIUM (MISCELLANEOUS) ×1 IMPLANT
SPIKE FLUID TRANSFER (MISCELLANEOUS) ×1 IMPLANT
STAPLER VISISTAT 35W (STAPLE) IMPLANT
SUT DVC VLOC 180 0 12IN GS21 (SUTURE) ×1
SUT PDS AB 0 CT1 27 (SUTURE) IMPLANT
SUT VIC AB 0 CT1 27 (SUTURE) ×1
SUT VIC AB 0 CT1 27XBRD ANBCTR (SUTURE) ×3 IMPLANT
SUT VIC AB 1 CTX 36 (SUTURE) ×1
SUT VIC AB 1 CTX36XBRD ANBCTRL (SUTURE) ×2 IMPLANT
SUT VIC AB 2-0 CT1 (SUTURE) IMPLANT
SUT VIC AB 3-0 PS2 18 (SUTURE) ×2
SUT VIC AB 3-0 PS2 18XBRD (SUTURE) ×2 IMPLANT
SUT VIC AB 4-0 KS 27 (SUTURE) ×1 IMPLANT
SUT VICRYL 0 TIES 12 18 (SUTURE) IMPLANT
SUT VLOC 180 0 9IN GS21 (SUTURE) IMPLANT
SUT VLOC 180 2-0 9IN GS21 (SUTURE) IMPLANT
SUTURE DVC VLC 180 0 12IN GS21 (SUTURE) IMPLANT
SYR CONTROL 10ML LL (SYRINGE) ×2 IMPLANT
TOWEL GREEN STERILE FF (TOWEL DISPOSABLE) ×2 IMPLANT
TRAY FOLEY W/BAG SLVR 14FR (SET/KITS/TRAYS/PACK) ×1 IMPLANT

## 2022-11-11 NOTE — Transfer of Care (Signed)
Immediate Anesthesia Transfer of Care Note  Patient: Carolyn Cole  Procedure(s) Performed: ABDOMINAL MYOMECTOMY  Patient Location: PACU  Anesthesia Type:General  Level of Consciousness: awake, alert , and oriented  Airway & Oxygen Therapy: Patient Spontanous Breathing and Patient connected to face mask oxygen  Post-op Assessment: Report given to RN, Post -op Vital signs reviewed and stable, Patient moving all extremities X 4, and Patient able to stick tongue midline  Post vital signs: Reviewed and stable  Last Vitals:  Vitals Value Taken Time  BP 107/61   Temp 97.6   Pulse 82 11/11/22 0954  Resp 11   SpO2 100 % 11/11/22 0954  Vitals shown include unfiled device data.  Last Pain:  Vitals:   11/11/22 0627  TempSrc:   PainSc: 4       Patients Stated Pain Goal: 0 (11/11/22 8295)  Complications: No notable events documented.

## 2022-11-11 NOTE — Interval H&P Note (Signed)
History and Physical Interval Note:  11/11/2022 7:04 AM  [name]  has presented today for surgery, with the diagnosis of Fibroids.  The various methods of treatment have been discussed with the patient and family. After consideration of risks, benefits and other options for treatment, the patient has consented to  Procedure(s): ABDOMINAL MYOMECTOMY (N/A) as a surgical intervention.  The patient's history has been reviewed, patient examined, no change in status, stable for surgery.  I have reviewed the patient's chart and labs.  Questions were answered to the patient's satisfaction.     Carolyn Cole

## 2022-11-11 NOTE — Progress Notes (Signed)
   11/11/22 1404  TOC Brief Assessment  Insurance and Status Reviewed  Patient has primary care physician Yes  Home environment has been reviewed single family home  Prior level of function: independent  Prior/Current Home Services No current home services  Readmission risk has been reviewed Yes  Transition of care needs no transition of care needs at this time

## 2022-11-11 NOTE — Anesthesia Procedure Notes (Addendum)
Procedure Name: Intubation Date/Time: 11/11/2022 7:47 AM  Performed by: Cy Blamer, CRNAPre-anesthesia Checklist: Patient identified, Emergency Drugs available, Suction available and Patient being monitored Patient Re-evaluated:Patient Re-evaluated prior to induction Oxygen Delivery Method: Circle system utilized Preoxygenation: Pre-oxygenation with 100% oxygen Induction Type: IV induction Ventilation: Mask ventilation without difficulty Laryngoscope Size: Glidescope and 3 Grade View: Grade I Tube type: Oral Tube size: 7.5 mm Number of attempts: 1 Airway Equipment and Method: Video-laryngoscopy, Rigid stylet and Bite block Placement Confirmation: ETT inserted through vocal cords under direct vision, positive ETCO2 and breath sounds checked- equal and bilateral Secured at: 22 cm Tube secured with: Tape Dental Injury: Teeth and Oropharynx as per pre-operative assessment  Comments: Dentition, large tongue and oral mucosa as preop; performed by Juanda Bond, SRNA

## 2022-11-11 NOTE — Brief Op Note (Signed)
11/11/2022  9:46 AM  PATIENT:  Carolyn Cole  32 y.o. female  PRE-OPERATIVE DIAGNOSIS:  Fibroids  POST-OPERATIVE DIAGNOSIS:  Fibroids  PROCEDURE:  Procedure(s): ABDOMINAL MYOMECTOMY (N/A)  SURGEON:  Surgeons and Role:    * Lorriane Shire, MD - Primary    * Anyanwu, Jethro Bastos, MD - Assisting  PHYSICIAN ASSISTANT: n/a  ASSISTANTS: Anyanwu, MD   ANESTHESIA:   general and TAP block  EBL:  75 mL   BLOOD ADMINISTERED:none  DRAINS: Urinary Catheter (Foley)   LOCAL MEDICATIONS USED:  NONE  SPECIMEN:  Source of Specimen:  uterine fibroids  DISPOSITION OF SPECIMEN:  PATHOLOGY  COUNTS:  YES  TOURNIQUET:  * No tourniquets in log *  DICTATION: .Note written in EPIC  PLAN OF CARE: Admit to inpatient   PATIENT DISPOSITION:  PACU - hemodynamically stable.   Delay start of Pharmacological VTE agent (>24hrs) due to surgical blood loss or risk of bleeding: not applicable

## 2022-11-11 NOTE — Anesthesia Procedure Notes (Signed)
Anesthesia Regional Block: TAP block   Pre-Anesthetic Checklist: , timeout performed,  Correct Patient, Correct Site, Correct Laterality,  Correct Procedure, Correct Position, site marked,  Risks and benefits discussed,  Surgical consent,  Pre-op evaluation,  At surgeon's request and post-op pain management  Laterality: Left  Prep: chloraprep       Needles:  Injection technique: Single-shot  Needle Type: Echogenic Needle     Needle Length: 9cm  Needle Gauge: 21     Additional Needles:   Narrative:  Start time: 11/11/2022 7:46 AM End time: 11/11/2022 7:49 AM Injection made incrementally with aspirations every 5 mL.  Performed by: Personally  Anesthesiologist: Achille Rich, MD  Additional Notes: Pt tolerated the procedure well.

## 2022-11-11 NOTE — Anesthesia Procedure Notes (Signed)
Anesthesia Regional Block: TAP block   Pre-Anesthetic Checklist: , timeout performed,  Correct Patient, Correct Site, Correct Laterality,  Correct Procedure, Correct Position, site marked,  Risks and benefits discussed,  Surgical consent,  Pre-op evaluation,  At surgeon's request and post-op pain management  Laterality: Right  Prep: chloraprep       Needles:  Injection technique: Single-shot  Needle Type: Echogenic Needle     Needle Length: 9cm  Needle Gauge: 21     Additional Needles:   Narrative:  Start time: 11/11/2022 7:50 AM End time: 11/11/2022 7:53 AM Injection made incrementally with aspirations every 5 mL.  Performed by: Personally  Anesthesiologist: Achille Rich, MD  Additional Notes: Pt tolerated the procedure well.

## 2022-11-11 NOTE — Anesthesia Preprocedure Evaluation (Signed)
Anesthesia Evaluation  Patient identified by MRN, date of birth, ID band Patient awake    Reviewed: Allergy & Precautions, H&P , NPO status , Patient's Chart, lab work & pertinent test results  Airway Mallampati: II   Neck ROM: full    Dental   Pulmonary asthma    breath sounds clear to auscultation       Cardiovascular hypertension,  Rhythm:regular Rate:Normal     Neuro/Psych   Anxiety        GI/Hepatic   Endo/Other    Renal/GU      Musculoskeletal   Abdominal   Peds  Hematology   Anesthesia Other Findings   Reproductive/Obstetrics fibroids                             Anesthesia Physical Anesthesia Plan  ASA: 2  Anesthesia Plan: General   Post-op Pain Management:    Induction: Intravenous  PONV Risk Score and Plan: 3 and Ondansetron, Dexamethasone, Midazolam and Treatment may vary due to age or medical condition  Airway Management Planned: Oral ETT  Additional Equipment:   Intra-op Plan:   Post-operative Plan: Extubation in OR  Informed Consent: I have reviewed the patients History and Physical, chart, labs and discussed the procedure including the risks, benefits and alternatives for the proposed anesthesia with the patient or authorized representative who has indicated his/her understanding and acceptance.     Dental advisory given  Plan Discussed with: CRNA, Anesthesiologist and Surgeon  Anesthesia Plan Comments:        Anesthesia Quick Evaluation

## 2022-11-11 NOTE — Progress Notes (Signed)
SUBJECTIVE: doing ok, very tired, pain better controlled after receiving pain medication. No chest pain or SOB. Has not been out of bed. No nausea and tolerating PO. Denies dizziness and lightheadedness.   OBJECTIVE:  Vitals:   11/11/22 1045 11/11/22 1132  BP: 110/72 113/70  Pulse: 66 64  Resp: 14 19  Temp:  (!) 97.4 F (36.3 C)  SpO2: 100% 99%    A/P: doing well postop - foley out in AM - d/c fluids in AM or when tolerating >200cc po - SCDs and early ambulation - AM labs - intraop photos reviewed - abdominal binder ordered - anticipate d/c POD#2  Lorriane Shire, MD, FACOG Minimally Invasive Gynecologic Surgery  Obstetrics and Gynecology, [facility] for [facility], [facility] 11/11/2022

## 2022-11-11 NOTE — Op Note (Signed)
Carolyn Cole PROCEDURE DATE: 11/11/2022  PREOPERATIVE DIAGNOSIS: fibroids POSTOPERATIVE DIAGNOSIS: fibroids PROCEDURE:  Open Myomectomy SURGEON: Lorriane Shire, MD ASSISTANT:  Jaynie Collins, MD    An experienced assistant was required given the standard of surgical care given the complexity of the case.  This assistant was needed for exposure, dissection, suctioning, retraction, instrument exchange, and for overall help during the procedure.  INDICATIONS: 32 y.o. G1P1001 with uterine fibroids.  Risks of surgery were discussed with the patient including but not limited to: bleeding which may require transfusion; infection which may require antibiotics; injury to surrounding organs; need for additional procedures including laparotomy;  and other postoperative/anesthesia complications. Written informed consent was obtained.    FINDINGS:  Normal external genitalia, 20 wk size mobile/immobile uterus with  abnormal  contours. Two subserosal fibroids on posterior uterus, a large pedunculated fibroid from right lateral fundus, ~4cm subserosal/intramural fibroid on the left anterior aspect of the uterus and ~1cm anterior intramural fibroid in lower uterine segment  ANESTHESIA: General, TAP block (per anesthesia) INTRAVENOUS FLUIDS:  1200 ml of LR ESTIMATED BLOOD LOSS:  75 ml URINE OUTPUT: 700 ml SPECIMENS:  uterine fibroids COMPLICATIONS:  None immediate.  PROCEDURE IN DETAIL:  The patient received IV preoperative antibiotics approximately 30 minutes prior to procedure.  She was then taken to the operating room and general anesthesia was administered without difficulty and SCDs were placed on the lower extremities.   She was placed in dorsal supine position and prepped and draped in a sterile manner.  A Foley catheter was inserted into the bladder and attached to constant drainage.  After an adequate timeout was performed, attention was then turned to the abdomen where a Pfannenstiel incision was  made with a scalpel.  This incision was carried down to the fascia using electrocautery.  The fascia was incised in the midline and this incision was extended bilaterally using the Mayo scissors.  Kocher's were applied to the superior aspect of the fascial incision, and the rectus muscles were dissected off bluntly and sharply using the Mayo scissors.  A similar process was carried out at the inferior aspect of the incision.  The rectus muscles were separated in the midline bluntly and the peritoneum was entered bluntly, with good visualization of the bladder and bowel.  Attention was then turned to the uterus.  The uterus was then delivered up through the incision. There was omental adhesion overlying the pedunculated fibroid, this was removed using blunt and sharp dissection. Vasopressin solution (20 units in 200cc normal saline) was injected into the stalk of the pedunculated fibroid. The stalk was sharply divided using electrocautery. Vasopressin was then injected into the posterior subserosal fibroids and they were removed sharply with vasopressin. Attention turned anteriorly and vasopressin injected over the surface of the leiomyoma.  An elliptical incision was made over the uterine leiomyoma into the leiomyoma, and the capsule was recognized.  Using blunt methods, the leiomyoma was freed from the surrounding myometrial tissue and removed intact. The incision was closed in multiple layers, using 0 vloc running stitches, and the serosa was reapproximated in a  baseball stitch fashion.  Overall good hemostasis was noted. The lower uterine segment defect was closed using 3-0 vicryl in a single layer and the serosa closed with a baseball stitch. The posterior serosal defects were cauterized and embricated using 2-0 vicryl. The pelvis was irrigated. The uterus was returned to the abdomen.  Surgicel knit was placed over the uterus as a hemostatic agent. The peritoneum was closed using 2-0  Vicryl running stitch.  The  fascia was reapproximated with 1 Vicryl running stitch and the subcutaneous layer was reapproximated with 2-0 vicryl continuous sutures.  The skin was closed with 3-0 Monocryl subcuticular stitch using a Mellody Dance needle.  The patient tolerated the procedure well. There were no complications during this case.  Sponge, lap, needle and instrument counts were correct x 3.  The patient was taken to the recovery room extubated and in stable condition.   Lorriane Shire, MD Minimally Invasive Gynecologic Surgery and Chronic Pelvic Pain Specialist Obstetrics and Gynecology, [facility] for [facility], [facility] 11/11/2022

## 2022-11-12 ENCOUNTER — Encounter (HOSPITAL_COMMUNITY): Payer: Self-pay | Admitting: Obstetrics and Gynecology

## 2022-11-12 NOTE — Plan of Care (Signed)
  Problem: Education: Goal: Knowledge of General Education information will improve Description: Including pain rating scale, medication(s)/side effects and non-pharmacologic comfort measures Outcome: Progressing   Problem: Health Behavior/Discharge Planning: Goal: Ability to manage health-related needs will improve Outcome: Progressing   Problem: Clinical Measurements: Goal: Ability to maintain clinical measurements within normal limits will improve Outcome: Progressing Goal: Will remain free from infection Outcome: Progressing Goal: Diagnostic test results will improve Outcome: Progressing Goal: Respiratory complications will improve Outcome: Progressing Goal: Cardiovascular complication will be avoided Outcome: Progressing   Problem: Activity: Goal: Risk for activity intolerance will decrease Outcome: Progressing   Problem: Nutrition: Goal: Adequate nutrition will be maintained Outcome: Progressing   Problem: Coping: Goal: Level of anxiety will decrease Outcome: Progressing   Problem: Elimination: Goal: Will not experience complications related to bowel motility Outcome: Progressing Goal: Will not experience complications related to urinary retention Outcome: Progressing   Problem: Pain Managment: Goal: General experience of comfort will improve Outcome: Progressing   Problem: Safety: Goal: Ability to remain free from injury will improve Outcome: Progressing   Problem: Skin Integrity: Goal: Risk for impaired skin integrity will decrease Outcome: Progressing   Problem: Education: Goal: Knowledge of the prescribed therapeutic regimen will improve Outcome: Progressing Goal: Understanding of sexual limitations or changes related to disease process or condition will improve Outcome: Progressing Goal: Individualized Educational Video(s) Outcome: Progressing   Problem: Self-Concept: Goal: Communication of feelings regarding changes in body function or  appearance will improve Outcome: Progressing   Problem: Skin Integrity: Goal: Demonstration of wound healing without infection will improve Outcome: Progressing   

## 2022-11-12 NOTE — Progress Notes (Signed)
SUBJECTIVE: feeling, has been able to void. Feeling gas pain. Has been able to ambulate with binder, which helped. Pain with position changes. Tolerating po. Vaginal bleeding present (started menses prior to surgery). No dizziness or lightheadedness.   OBJECTIVE: Today's Vitals   11/12/22 0809 11/12/22 0856 11/12/22 1407 11/12/22 1747  BP:   104/74   Pulse:   (!) 59   Resp:   18   Temp:   98.2 F (36.8 C)   TempSrc:   Oral   SpO2:   100%   Weight:      Height:      PainSc: 7  0-No pain  2    NAD Lying in bed Abdomen appropriately tender Honeycomb dressing in place, clean/dry/intact  A/P: meeting postop goals Plan for d/c tomorrow AM  Lorriane Shire, MD, FACOG Minimally Invasive Gynecologic Surgery  Obstetrics and Gynecology, [facility] for [facility], [facility] 11/12/2022

## 2022-11-12 NOTE — Anesthesia Postprocedure Evaluation (Signed)
Anesthesia Post Note  Patient: Carolyn Cole  Procedure(s) Performed: ABDOMINAL MYOMECTOMY (Abdomen)     Patient location during evaluation: PACU Anesthesia Type: Regional and General Level of consciousness: awake and alert Pain management: pain level controlled Vital Signs Assessment: post-procedure vital signs reviewed and stable Respiratory status: spontaneous breathing, nonlabored ventilation, respiratory function stable and patient connected to nasal cannula oxygen Cardiovascular status: blood pressure returned to baseline and stable Postop Assessment: no apparent nausea or vomiting Anesthetic complications: no   No notable events documented.  Last Vitals:  Vitals:   11/12/22 0436 11/12/22 0754  BP: 110/62 111/68  Pulse: 67 68  Resp: 16 18  Temp: 36.9 C 36.6 C  SpO2: 98% 100%    Last Pain:  Vitals:   11/12/22 0754  TempSrc: Oral  PainSc:                  Arda Daggs S

## 2022-11-12 NOTE — Progress Notes (Signed)
Gynecology Progress Note  Admission Date: 11/11/2022 Current Date: 11/12/2022 7:54 AM  Carolyn Cole is a 32 y.o. G1P1001 HD#2/POD#1 admitted for postop s/p open myomectomy.    History complicated by: Patient Active Problem List   Diagnosis Date Noted   Fibroid 11/11/2022   Intramural and subserous leiomyoma of uterus 11/11/2022   Preeclampsia in postpartum period 02/08/2021   Encounter for induction of labor 01/28/2021   BMI 36.0-36.9,adult 01/27/2021   Fibroids 06/04/2018   Asthma 05/18/2018   Intramural leiomyoma of uterus 05/18/2018   Personal history of MRSA (methicillin resistant Staphylococcus aureus) 05/18/2018    ROS and patient/family/surgical history, located on admission H&P note dated 11/11/2022, have been reviewed, and there are no changes except as noted below Yesterday/Overnight Events:  S/p uncomplicated open myo  Subjective:  Doing well this AM. Feels better than yesterday. Foley removed earlier this AM, no void as of yet. No dizziness, lightheadedness, chest pain, or SOB. Tolerating PO. Passing flatus. No ambulation as of yet.   Objective:   Vitals:   11/11/22 1632 11/11/22 2006 11/12/22 0007 11/12/22 0436  BP: 98/70 107/66 115/62 110/62  Pulse: 68 66 68 67  Resp: 16 20 18 16   Temp: 98.5 F (36.9 C) 98.6 F (37 C) 98.5 F (36.9 C) 98.5 F (36.9 C)  TempSrc: Oral Oral Oral Oral  SpO2: 100% 100% 99% 98%  Weight:      Height:        Temp:  [97.4 F (36.3 C)-98.6 F (37 C)] 98.5 F (36.9 C) (08/01 0436) Pulse Rate:  [64-82] 67 (08/01 0436) Resp:  [14-20] 16 (08/01 0436) BP: (98-115)/(61-72) 110/62 (08/01 0436) SpO2:  [98 %-100 %] 98 % (08/01 0436) I/O last 3 completed shifts: In: 1300 [I.V.:1200; IV Piggyback:100] Out: 1150 [Urine:1075; Blood:75] No intake/output data recorded.  Intake/Output Summary (Last 24 hours) at 11/12/2022 0754 Last data filed at 11/12/2022 0500 Gross per 24 hour  Intake 1300 ml  Output 1150 ml  Net 150 ml     Current  Vital Signs 24h Vital Sign Ranges  T 98.5 F (36.9 C) Temp  Avg: 98.1 F (36.7 C)  Min: 97.4 F (36.3 C)  Max: 98.6 F (37 C)  BP 110/62 BP  Min: 98/70  Max: 115/62  HR 67 Pulse  Avg: 68.7  Min: 64  Max: 82  RR 16 Resp  Avg: 16.6  Min: 14  Max: 20  SaO2 98 % Room Air SpO2  Avg: 99.6 %  Min: 98 %  Max: 100 %       24 Hour I/O Current Shift I/O  Time Ins Outs 07/31 0701 - 08/01 0700 In: 1300 [I.V.:1200] Out: 1150 [Urine:1075] No intake/output data recorded.   Patient Vitals for the past 12 hrs:  BP Temp Temp src Pulse Resp SpO2  11/12/22 0436 110/62 98.5 F (36.9 C) Oral 67 16 98 %  11/12/22 0007 115/62 98.5 F (36.9 C) Oral 68 18 99 %  11/11/22 2006 107/66 98.6 F (37 C) Oral 66 20 100 %     Patient Vitals for the past 24 hrs:  BP Temp Temp src Pulse Resp SpO2  11/12/22 0436 110/62 98.5 F (36.9 C) Oral 67 16 98 %  11/12/22 0007 115/62 98.5 F (36.9 C) Oral 68 18 99 %  11/11/22 2006 107/66 98.6 F (37 C) Oral 66 20 100 %  11/11/22 1632 98/70 98.5 F (36.9 C) Oral 68 16 100 %  11/11/22 1132 113/70 (!) 97.4 F (36.3 C)  Oral 64 19 99 %  11/11/22 1045 110/72 -- -- 66 14 100 %  11/11/22 1030 109/70 97.8 F (36.6 C) -- 66 15 100 %  11/11/22 1015 109/68 -- -- 71 15 100 %  11/11/22 1000 112/63 -- -- 69 14 100 %  11/11/22 0954 107/61 97.6 F (36.4 C) -- 82 19 100 %    Physical exam: General appearance: alert, cooperative, and appears stated age Abdomen:  appropriately tender GU: No gross VB Lungs:  normal respiratory effort Extremities: no lower extremity edema Skin: intact Psych: appropriate Neurologic: Grossly normal  Medications Current Facility-Administered Medications  Medication Dose Route Frequency Provider Last Rate Last Admin   acetaminophen (TYLENOL) tablet 1,000 mg  1,000 mg Oral Q6H Thomasenia Dowse, MD   1,000 mg at 11/12/22 1610   bisacodyl (DULCOLAX) EC tablet 5 mg  5 mg Oral Daily PRN Lorriane Shire, MD       docusate sodium (COLACE)  capsule 100 mg  100 mg Oral BID Kaipo Ardis, MD       HYDROmorphone (DILAUDID) injection 0.2-0.6 mg  0.2-0.6 mg Intravenous Q2H PRN Destiny Hagin, MD   0.5 mg at 11/11/22 1133   ibuprofen (ADVIL) tablet 600 mg  600 mg Oral Q6H Ulonda Klosowski, MD       lactated ringers infusion   Intravenous Continuous Loreda Silverio, MD 100 mL/hr at 11/11/22 1126 New Bag at 11/11/22 1126   menthol-cetylpyridinium (CEPACOL) lozenge 3 mg  1 lozenge Oral Q2H PRN Hesham Womac, MD       ondansetron (ZOFRAN) tablet 4 mg  4 mg Oral Q6H PRN Beryl Balz, MD       Or   ondansetron (ZOFRAN) injection 4 mg  4 mg Intravenous Q6H PRN Luretta Everly, MD       oxyCODONE (Oxy IR/ROXICODONE) immediate release tablet 5-10 mg  5-10 mg Oral Q4H PRN Rhilyn Battle, MD       polyethylene glycol (MIRALAX / GLYCOLAX) packet 17 g  17 g Oral Daily PRN Mark Hassey, MD       simethicone (MYLICON) chewable tablet 80 mg  80 mg Oral QID PRN Lorriane Shire, MD          Labs  Recent Labs  Lab 11/12/22 0422  WBC 10.3  HGB 12.0  HCT 35.4*  PLT 231    Recent Labs  Lab 11/12/22 0422  NA 138  K 3.5  CL 106  CO2 23  BUN 5*  CREATININE 0.67  CALCIUM 8.4*  GLUCOSE 109*     Assessment & Plan:  POD#1 s/p open myomectomy for fibroids, doing well   *GYN: follow up surgical pathology * abdominal binder for pain *DTV 6-8 hr following foley removal *ambulatory today *ABD pressure dressing to be removed this afternoon *Pain: scheduled APAP/NSAIDs for pain and prn oxycodone *FEN/GI: regular diet, d/c IVF, tolerating po *PPx: SCDs, ambulation, IC *Dispo: likely d/c tomorrow assuming all other postop goals met  Code Status: Full Code   Lorriane Shire, MD Minimally Invasive Gynecologic Surgery Center for [facility] (Faculty Practice) @TODAY @ 7:54 AM

## 2022-11-13 MED ORDER — IBUPROFEN 600 MG PO TABS: 600.0000 mg | ORAL_TABLET | Freq: Four times a day (QID) | ORAL | 2 refills | Status: DC

## 2022-11-13 MED ORDER — OXYCODONE HCL 5 MG PO TABS: 5.0000 mg | ORAL_TABLET | ORAL | 0 refills | Status: DC | PRN

## 2022-11-13 MED ORDER — ACETAMINOPHEN 500 MG PO TABS: 1000.0000 mg | ORAL_TABLET | Freq: Four times a day (QID) | ORAL | 2 refills | Status: DC

## 2022-11-13 MED ORDER — SIMETHICONE 80 MG PO CHEW: 80.0000 mg | CHEWABLE_TABLET | Freq: Four times a day (QID) | ORAL | 0 refills | Status: DC | PRN

## 2022-11-13 MED ORDER — POLYETHYLENE GLYCOL 3350 17 G PO PACK: 17.0000 g | PACK | Freq: Every day | ORAL | 2 refills | Status: DC | PRN

## 2022-11-13 NOTE — Plan of Care (Signed)
  Problem: Education: Goal: Knowledge of General Education information will improve Description: Including pain rating scale, medication(s)/side effects and non-pharmacologic comfort measures Outcome: Progressing   Problem: Health Behavior/Discharge Planning: Goal: Ability to manage health-related needs will improve Outcome: Progressing   Problem: Clinical Measurements: Goal: Ability to maintain clinical measurements within normal limits will improve Outcome: Progressing Goal: Will remain free from infection Outcome: Progressing Goal: Diagnostic test results will improve Outcome: Progressing Goal: Respiratory complications will improve Outcome: Progressing Goal: Cardiovascular complication will be avoided Outcome: Progressing   Problem: Activity: Goal: Risk for activity intolerance will decrease Outcome: Progressing   Problem: Nutrition: Goal: Adequate nutrition will be maintained Outcome: Progressing   Problem: Coping: Goal: Level of anxiety will decrease Outcome: Progressing   Problem: Elimination: Goal: Will not experience complications related to bowel motility Outcome: Progressing Goal: Will not experience complications related to urinary retention Outcome: Progressing   Problem: Pain Managment: Goal: General experience of comfort will improve Outcome: Progressing   Problem: Safety: Goal: Ability to remain free from injury will improve Outcome: Progressing   Problem: Skin Integrity: Goal: Risk for impaired skin integrity will decrease Outcome: Progressing   Problem: Education: Goal: Knowledge of the prescribed therapeutic regimen will improve Outcome: Progressing Goal: Understanding of sexual limitations or changes related to disease process or condition will improve Outcome: Progressing Goal: Individualized Educational Video(s) Outcome: Progressing   Problem: Self-Concept: Goal: Communication of feelings regarding changes in body function or  appearance will improve Outcome: Progressing   Problem: Skin Integrity: Goal: Demonstration of wound healing without infection will improve Outcome: Progressing   

## 2022-11-13 NOTE — Plan of Care (Signed)
  Problem: Education: Goal: Knowledge of General Education information will improve Description: Including pain rating scale, medication(s)/side effects and non-pharmacologic comfort measures Outcome: Adequate for Discharge   Problem: Health Behavior/Discharge Planning: Goal: Ability to manage health-related needs will improve Outcome: Adequate for Discharge   Problem: Clinical Measurements: Goal: Ability to maintain clinical measurements within normal limits will improve Outcome: Adequate for Discharge Goal: Will remain free from infection Outcome: Adequate for Discharge Goal: Diagnostic test results will improve Outcome: Adequate for Discharge Goal: Respiratory complications will improve Outcome: Adequate for Discharge Goal: Cardiovascular complication will be avoided Outcome: Adequate for Discharge   Problem: Activity: Goal: Risk for activity intolerance will decrease Outcome: Adequate for Discharge   Problem: Nutrition: Goal: Adequate nutrition will be maintained Outcome: Adequate for Discharge   Problem: Coping: Goal: Level of anxiety will decrease Outcome: Adequate for Discharge   Problem: Elimination: Goal: Will not experience complications related to bowel motility Outcome: Adequate for Discharge Goal: Will not experience complications related to urinary retention Outcome: Adequate for Discharge   Problem: Pain Managment: Goal: General experience of comfort will improve Outcome: Adequate for Discharge   Problem: Safety: Goal: Ability to remain free from injury will improve Outcome: Adequate for Discharge   Problem: Skin Integrity: Goal: Risk for impaired skin integrity will decrease Outcome: Adequate for Discharge   Problem: Education: Goal: Knowledge of the prescribed therapeutic regimen will improve Outcome: Adequate for Discharge Goal: Understanding of sexual limitations or changes related to disease process or condition will improve Outcome: Adequate  for Discharge Goal: Individualized Educational Video(s) Outcome: Adequate for Discharge   Problem: Self-Concept: Goal: Communication of feelings regarding changes in body function or appearance will improve Outcome: Adequate for Discharge   Problem: Skin Integrity: Goal: Demonstration of wound healing without infection will improve Outcome: Adequate for Discharge   

## 2022-11-13 NOTE — Progress Notes (Signed)
Discharge instructions (including medications) discussed with and copy provided to patient. She verbalized understanding. PIV x 1 removed and patient dressed herself. Her ride is on the way.

## 2022-11-13 NOTE — Progress Notes (Signed)
Gynecology Progress Note  Admission Date: 11/11/2022 Current Date: 11/13/2022 8:31 AM  Carolyn Cole is a 32 y.o. G1P1001 HD#3/POD#2 admitted s/p open myomectomy   History complicated by: Patient Active Problem List   Diagnosis Date Noted   Fibroid 11/11/2022   Intramural and subserous leiomyoma of uterus 11/11/2022   Preeclampsia in postpartum period 02/08/2021   Encounter for induction of labor 01/28/2021   BMI 36.0-36.9,adult 01/27/2021   Fibroids 06/04/2018   Asthma 05/18/2018   Intramural leiomyoma of uterus 05/18/2018   Personal history of MRSA (methicillin resistant Staphylococcus aureus) 05/18/2018    ROS and patient/family/surgical history, located on admission H&P note dated 11/11/2022, have been reviewed, and there are no changes except as noted below Yesterday/Overnight Events:  NAE  Subjective:  Doing well this morning. Has been able to shower. Mild vaginal bleeding. No dizziness, lightheadedness, chest pain, or SOB. Tolerating po.   Objective:   Vitals:   11/12/22 1407 11/12/22 2041 11/13/22 0500 11/13/22 0815  BP: 104/74 109/67 105/71 111/73  Pulse: (!) 59 (!) 59 62 63  Resp: 18 18 18 17   Temp: 98.2 F (36.8 C) 98.4 F (36.9 C) 98 F (36.7 C) 98.4 F (36.9 C)  TempSrc: Oral Oral Oral Oral  SpO2: 100% 100% 100% 100%  Weight:      Height:        Temp:  [98 F (36.7 C)-98.4 F (36.9 C)] 98.4 F (36.9 C) (08/02 0815) Pulse Rate:  [59-63] 63 (08/02 0815) Resp:  [17-18] 17 (08/02 0815) BP: (104-111)/(67-74) 111/73 (08/02 0815) SpO2:  [100 %] 100 % (08/02 0815) I/O last 3 completed shifts: In: 1200 [P.O.:1200] Out: 300 [Urine:300] No intake/output data recorded.  Intake/Output Summary (Last 24 hours) at 11/13/2022 0831 Last data filed at 11/12/2022 1800 Gross per 24 hour  Intake 1200 ml  Output --  Net 1200 ml     Current Vital Signs 24h Vital Sign Ranges  T 98.4 F (36.9 C) Temp  Avg: 98.3 F (36.8 C)  Min: 98 F (36.7 C)  Max: 98.4 F (36.9 C)   BP 111/73 BP  Min: 104/74  Max: 111/73  HR 63 Pulse  Avg: 60.8  Min: 59  Max: 63  RR 17 Resp  Avg: 17.8  Min: 17  Max: 18  SaO2 100 % Room Air SpO2  Avg: 100 %  Min: 100 %  Max: 100 %       24 Hour I/O Current Shift I/O  Time Ins Outs 08/01 0701 - 08/02 0700 In: 1200 [P.O.:1200] Out: -  No intake/output data recorded.   Patient Vitals for the past 12 hrs:  BP Temp Temp src Pulse Resp SpO2  11/13/22 0815 111/73 98.4 F (36.9 C) Oral 63 17 100 %  11/13/22 0500 105/71 98 F (36.7 C) Oral 62 18 100 %  11/12/22 2041 109/67 98.4 F (36.9 C) Oral (!) 59 18 100 %     Patient Vitals for the past 24 hrs:  BP Temp Temp src Pulse Resp SpO2  11/13/22 0815 111/73 98.4 F (36.9 C) Oral 63 17 100 %  11/13/22 0500 105/71 98 F (36.7 C) Oral 62 18 100 %  11/12/22 2041 109/67 98.4 F (36.9 C) Oral (!) 59 18 100 %  11/12/22 1407 104/74 98.2 F (36.8 C) Oral (!) 59 18 100 %    Physical exam: General appearance: alert, cooperative, and appears stated age Abdomen:  soft appropriately tender, honeycomb dressing in place GU: No gross VB Lungs:  normal respiratory effort Extremities: no lower extremity edema Skin: intact Psych: appropriate Neurologic: Grossly normal  Medications Current Facility-Administered Medications  Medication Dose Route Frequency Provider Last Rate Last Admin   acetaminophen (TYLENOL) tablet 1,000 mg  1,000 mg Oral Q6H Raeshawn Tafolla, MD   1,000 mg at 11/13/22 7564   bisacodyl (DULCOLAX) EC tablet 5 mg  5 mg Oral Daily PRN Lorriane Shire, MD   5 mg at 11/12/22 0809   docusate sodium (COLACE) capsule 100 mg  100 mg Oral BID Lorriane Shire, MD   100 mg at 11/13/22 0805   HYDROmorphone (DILAUDID) injection 0.2-0.6 mg  0.2-0.6 mg Intravenous Q2H PRN Lorriane Shire, MD   0.5 mg at 11/11/22 1133   ibuprofen (ADVIL) tablet 600 mg  600 mg Oral Q6H Breanah Faddis, MD   600 mg at 11/13/22 3329   menthol-cetylpyridinium (CEPACOL) lozenge 3 mg  1 lozenge  Oral Q2H PRN Lorriane Shire, MD       ondansetron (ZOFRAN) tablet 4 mg  4 mg Oral Q6H PRN Tyaira Heward, MD       Or   ondansetron (ZOFRAN) injection 4 mg  4 mg Intravenous Q6H PRN Meril Dray, MD       oxyCODONE (Oxy IR/ROXICODONE) immediate release tablet 5-10 mg  5-10 mg Oral Q4H PRN Kysen Wetherington, MD   5 mg at 11/12/22 1702   polyethylene glycol (MIRALAX / GLYCOLAX) packet 17 g  17 g Oral Daily PRN Rae Sutcliffe, MD       simethicone (MYLICON) chewable tablet 80 mg  80 mg Oral QID PRN Lorriane Shire, MD          Labs  Recent Labs  Lab 11/12/22 0422  WBC 10.3  HGB 12.0  HCT 35.4*  PLT 231    Recent Labs  Lab 11/12/22 0422  NA 138  K 3.5  CL 106  CO2 23  BUN 5*  CREATININE 0.67  CALCIUM 8.4*  GLUCOSE 109*    Assessment & Plan:  Doing well and meeting postop goals following open myomectomy *GYN: meeting postop goals *Pain: nsaid/apap/oxy *FEN/GI: regular diet *PPx: SCD, IC, ambulation *Dispo: dc this am  Code Status: Full Code   Lorriane Shire, MD Minimally Invasive Gynecologic Surgery Center for [facility] (Faculty Practice) 11/13/22 8:31 AM

## 2022-11-13 NOTE — Discharge Instructions (Signed)
Post-surgical Instructions, Outpatient Surgery  You may expect to feel dizzy, weak, and drowsy for as long as 24 hours after receiving the medicine that made you sleep (anesthetic). For the first 24 hours after your surgery:   Do not drive a car, ride a bicycle, participate in physical activities, or take public transportation until you are done taking narcotic pain medicines or as directed by Dr. Briscoe Deutscher.  Do not drink alcohol or take tranquilizers.  Do not take medicine that has not been prescribed by your physicians.  Do not sign important papers or make important decisions while on narcotic pain medicines.  Have a responsible person with you.   CARE OF INCISION Remove honeycomb dressing in one week.  If there are steri-strips or dermabond, just let this loosen on its own.  You may shower on the first day after your surgery.  Do not sit in a tub bath for one week. Avoid heavy lifting (more than 10 pounds/4.5 kilograms), pushing, or pulling.  Avoid activities that may risk injury to your incisions.   PAIN MANAGEMENT Ibuprofen 600-800mg .  (This is the same as 4-200mg  over the counter tablets of Motrin or ibuprofen.)  Take this every 6 hours or as needed for cramping.   Acetaminophen 1000mg  (This is the same as 2-500mg  over the counter extra strength tylenol). Take this every 6 hours for the first 3 days or as needed afterwards for pain Oxycodone 5mg  For more severe pain, take one or two tablets every four to six hours as needed for pain control.  (Remember that narcotic pain medications increase your risk of constipation.  If this becomes a problem, you may take an over the counter laxative like miralax.)  DO'S AND DON'T'S Do not take a tub bath for 2 weeks.  You may shower on the first day after your surgery Do not do any heavy lifting for one to two weeks.  This increases the chance of bleeding. Do move around as you feel able.  Stairs are fine.  You may begin to exercise again as you feel  able.  Do not lift any weights for two weeks. Do not put anything in the vagina for two weeks--no tampons, intercourse, or douching.    REGULAR MEDIATIONS/VITAMINS: You may restart all of your regular medications as prescribed. You may restart all of your vitamins as you normally take them.    PLEASE CALL OR SEEK MEDICAL CARE IF: You have persistent nausea and vomiting.  You have trouble eating or drinking.  You have an oral temperature above 100.5.  You have constipation that is not helped by adjusting diet or increasing fluid intake. Pain medicines are a common cause of constipation.  You have heavy vaginal bleeding You have redness or drainage from your incision(s) or there is increasing pain or tenderness near or in the surgical site.

## 2022-11-13 NOTE — Discharge Summary (Signed)
Physician Discharge Summary  Patient ID: Carolyn Cole MRN: [id number] DOB/AGE: 1990/08/15 32 y.o.  Admit date: 11/11/2022 Discharge date: 11/13/2022  Admission Diagnoses:  Discharge Diagnoses:  Principal Problem:   Fibroid Active Problems:   Intramural and subserous leiomyoma of uterus   Discharged Condition: {condition:18240}  Hospital Course: ***  Consults: {consultation:18241}  Significant Diagnostic Studies: {diagnostics:18242}  Treatments: {Tx:18249}  Discharge Exam: Blood pressure 111/73, pulse 63, temperature 98.4 F (36.9 C), temperature source Oral, resp. rate 17, height 5\' 4"  (1.626 m), weight 102.5 kg, SpO2 100%, unknown if currently breastfeeding. {physical WUJW:1191478}  Disposition:    Allergies as of 11/13/2022   No Known Allergies      Medication List     TAKE these medications    acetaminophen 500 MG tablet Commonly known as: TYLENOL Take 2 tablets (1,000 mg total) by mouth every 6 (six) hours.   ibuprofen 600 MG tablet Commonly known as: ADVIL Take 1 tablet (600 mg total) by mouth every 6 (six) hours.   oxyCODONE 5 MG immediate release tablet Commonly known as: Oxy IR/ROXICODONE Take 1 tablet (5 mg total) by mouth every 4 (four) hours as needed for severe pain.   polyethylene glycol 17 g packet Commonly known as: MIRALAX / GLYCOLAX Take 17 g by mouth daily as needed for mild constipation.   simethicone 80 MG chewable tablet Commonly known as: MYLICON Chew 1 tablet (80 mg total) by mouth 4 (four) times daily as needed for flatulence.         Signed: Lorriane Shire 11/13/2022, 8:50 AM

## 2022-11-25 DIAGNOSIS — F4323 Adjustment disorder with mixed anxiety and depressed mood: Secondary | ICD-10-CM | POA: Diagnosis not present

## 2022-11-30 DIAGNOSIS — F4323 Adjustment disorder with mixed anxiety and depressed mood: Secondary | ICD-10-CM | POA: Diagnosis not present

## 2022-12-01 ENCOUNTER — Ambulatory Visit: Payer: Medicaid Other | Admitting: Obstetrics and Gynecology

## 2022-12-03 ENCOUNTER — Encounter: Payer: Self-pay | Admitting: Obstetrics and Gynecology

## 2022-12-03 ENCOUNTER — Other Ambulatory Visit: Payer: Self-pay

## 2022-12-03 ENCOUNTER — Ambulatory Visit (INDEPENDENT_AMBULATORY_CARE_PROVIDER_SITE_OTHER): Payer: Medicaid Other | Admitting: Obstetrics and Gynecology

## 2022-12-03 VITALS — BP 113/86 | HR 73 | Wt 222.2 lb

## 2022-12-03 DIAGNOSIS — D219 Benign neoplasm of connective and other soft tissue, unspecified: Secondary | ICD-10-CM

## 2022-12-03 DIAGNOSIS — Z09 Encounter for follow-up examination after completed treatment for conditions other than malignant neoplasm: Secondary | ICD-10-CM

## 2022-12-03 NOTE — Progress Notes (Signed)
   POSTOPERATIVE VISIT NOTE   Subjective:     Carolyn Cole is a 32 y.o. G1P1001 who presents to the clinic 3 weeks status post open myomectomy for fibroids. Eating a regular diet without difficulty. Bowel movements are normal. Pain is controlled with current analgesics. Medications being used: acetaminophen and ibuprofen (OTC). Having new hemorrhoids. Not strainign Incision: healing well, felt may be a little swollen and can get uncomfortable when clothing is rubbing against the incision. Difficulty with bending over to tie shoes. Would like to start lifting so that she can lift her child.  Vaginal bleeding: no Resumed sexual acitivity: no  Notes having new hemorrhoids  The following portions of the patient's history were reviewed and updated as appropriate: allergies, current medications, past family history, past medical history, past social history, past surgical history, and problem list..   Review of Systems Pertinent items are noted in HPI.    Objective:    BP 113/86   Pulse 73   Wt 222 lb 3.2 oz (100.8 kg)   LMP 11/09/2022   BMI 38.14 kg/m  General:  alert, cooperative, and no distress  Abdomen: soft, mild tenderness in right inferolateral portion of incision  Incision:   healing well, no drainage, no erythema, no hernia, no seroma, no swelling, no dehiscence, incision well approximated  Pelvic:   Exam deferred.    Pathology Results: Clinical History: uterine fibroids ([place])  FINAL MICROSCOPIC DIAGNOSIS:   A. UTERINE FIBROIDS, MYOMECTOMY:  - Leiomyomata with degenerative changes     Assessment:   Doing well postoperatively. Operative findings again reviewed. Pathology report discussed.   1. Postop check Doing well, meeting postop goals  2. Fibroids Reviewed benign pathology. Endometrium not disrupted so vaginal delivery likely safe. Recommend wait at least 3 months prior to trying to concieve.     Plan:   1. Continue any current medications. 2. Reviewed  intra-op photos 3. Activity restrictions: no lifting more than 10 pounds and no driving if not able to react quickly 4. Follow up  2 months  Lorriane Shire, MD Obstetrician & Gynecologist, [facility] for [employer], [facility]

## 2022-12-07 DIAGNOSIS — E669 Obesity, unspecified: Secondary | ICD-10-CM | POA: Diagnosis not present

## 2022-12-07 DIAGNOSIS — Z6837 Body mass index (BMI) 37.0-37.9, adult: Secondary | ICD-10-CM | POA: Diagnosis not present

## 2022-12-09 DIAGNOSIS — F4323 Adjustment disorder with mixed anxiety and depressed mood: Secondary | ICD-10-CM | POA: Diagnosis not present

## 2022-12-21 DIAGNOSIS — F4323 Adjustment disorder with mixed anxiety and depressed mood: Secondary | ICD-10-CM | POA: Diagnosis not present

## 2022-12-25 DIAGNOSIS — F4323 Adjustment disorder with mixed anxiety and depressed mood: Secondary | ICD-10-CM | POA: Diagnosis not present

## 2022-12-29 DIAGNOSIS — F4323 Adjustment disorder with mixed anxiety and depressed mood: Secondary | ICD-10-CM | POA: Diagnosis not present

## 2023-01-05 ENCOUNTER — Encounter: Payer: Self-pay | Admitting: Obstetrics and Gynecology

## 2023-01-05 ENCOUNTER — Ambulatory Visit: Payer: Medicaid Other | Admitting: Obstetrics and Gynecology

## 2023-01-05 ENCOUNTER — Other Ambulatory Visit: Payer: Self-pay

## 2023-01-05 VITALS — BP 115/69 | HR 76 | Wt 223.6 lb

## 2023-01-05 DIAGNOSIS — Z09 Encounter for follow-up examination after completed treatment for conditions other than malignant neoplasm: Secondary | ICD-10-CM

## 2023-01-05 DIAGNOSIS — N941 Unspecified dyspareunia: Secondary | ICD-10-CM

## 2023-01-05 DIAGNOSIS — N939 Abnormal uterine and vaginal bleeding, unspecified: Secondary | ICD-10-CM

## 2023-01-05 DIAGNOSIS — Z1331 Encounter for screening for depression: Secondary | ICD-10-CM | POA: Diagnosis not present

## 2023-01-05 DIAGNOSIS — D219 Benign neoplasm of connective and other soft tissue, unspecified: Secondary | ICD-10-CM

## 2023-01-05 MED ORDER — TRANEXAMIC ACID 650 MG PO TABS: 1300.0000 mg | ORAL_TABLET | Freq: Three times a day (TID) | ORAL | 2 refills | Status: DC

## 2023-01-05 NOTE — Progress Notes (Signed)
   POSTOPERATIVE VISIT NOTE   Subjective:     Carolyn Cole is a 32 y.o. G1P1001 who presents to the clinic 8 weeks status post myomectomy for abnormal uterine bleeding and fibroids. Eating a regular diet without difficulty. Bowel movements are normal. The patient is not having any pain. Noticed more soreness after being at the park with child and picking her up several times but typically is not doing that much lifting and does not have that much pain. Also notes she involuntarily sucks in her stomach, but that is an old habit Incision: healing well, no concerns Vaginal bleeding: continues to have heavy menses as they were prior to her surgery She is interested in conceiving as soon as she is cleared to Has noticed some pelvic pain returning and slowly increasing urinary leakage - had done PFPT previously and has home exercises that she can do  The following portions of the patient's history were reviewed and updated as appropriate: allergies, current medications, past family history, past medical history, past social history, past surgical history, and problem list..   Review of Systems Pertinent items are noted in HPI.    Objective:    BP 115/69   Pulse 76   Wt 223 lb 9.6 oz (101.4 kg)   LMP 12/30/2022   BMI 38.38 kg/m  General:  alert, cooperative, and no distress  Abdomen: soft, non-tender  Incision:   healing well, no drainage, no erythema, no hernia, no seroma, no swelling, no dehiscence, incision well approximated  Pelvic:   Exam deferred.     Assessment:   Doing well postoperatively. Operative findings again reviewed. Pathology report discussed.   1. Abnormal uterine bleeding (AUB) Reviewed that menes  - tranexamic acid (LYSTEDA) 650 MG TABS tablet; Take 2 tablets (1,300 mg total) by mouth 3 (three) times daily. Take during menses for a maximum of five days  Dispense: 30 tablet; Refill: 2  2. Postop check Doing well   3. Fibroids  4. Dyspareunia, female Will  resume HEP from PFPT. If no improvement in symptoms, will reach out for referral to PFPT    Plan:   1. Able to attempt conception 3 months postop  2. Cleared to resume other activities   Lorriane Shire, MD Obstetrician & Gynecologist, [facility] for [employer], [facility]

## 2023-01-20 DIAGNOSIS — F4323 Adjustment disorder with mixed anxiety and depressed mood: Secondary | ICD-10-CM | POA: Diagnosis not present

## 2023-01-27 DIAGNOSIS — F4323 Adjustment disorder with mixed anxiety and depressed mood: Secondary | ICD-10-CM | POA: Diagnosis not present

## 2023-02-01 DIAGNOSIS — Z0000 Encounter for general adult medical examination without abnormal findings: Secondary | ICD-10-CM | POA: Diagnosis not present

## 2023-02-01 DIAGNOSIS — Z9889 Other specified postprocedural states: Secondary | ICD-10-CM | POA: Diagnosis not present

## 2023-02-01 DIAGNOSIS — Z124 Encounter for screening for malignant neoplasm of cervix: Secondary | ICD-10-CM | POA: Diagnosis not present

## 2023-02-02 DIAGNOSIS — R059 Cough, unspecified: Secondary | ICD-10-CM | POA: Diagnosis not present

## 2023-02-02 DIAGNOSIS — Z0000 Encounter for general adult medical examination without abnormal findings: Secondary | ICD-10-CM | POA: Diagnosis not present

## 2023-02-02 DIAGNOSIS — R062 Wheezing: Secondary | ICD-10-CM | POA: Diagnosis not present

## 2023-02-02 DIAGNOSIS — E669 Obesity, unspecified: Secondary | ICD-10-CM | POA: Diagnosis not present

## 2023-02-02 DIAGNOSIS — G4719 Other hypersomnia: Secondary | ICD-10-CM | POA: Diagnosis not present

## 2023-02-02 DIAGNOSIS — E7800 Pure hypercholesterolemia, unspecified: Secondary | ICD-10-CM | POA: Diagnosis not present

## 2023-02-02 DIAGNOSIS — Z6838 Body mass index (BMI) 38.0-38.9, adult: Secondary | ICD-10-CM | POA: Diagnosis not present

## 2023-02-02 DIAGNOSIS — F419 Anxiety disorder, unspecified: Secondary | ICD-10-CM | POA: Diagnosis not present

## 2023-02-02 DIAGNOSIS — Z9889 Other specified postprocedural states: Secondary | ICD-10-CM | POA: Diagnosis not present

## 2023-02-09 DIAGNOSIS — Z6838 Body mass index (BMI) 38.0-38.9, adult: Secondary | ICD-10-CM | POA: Diagnosis not present

## 2023-02-09 DIAGNOSIS — E669 Obesity, unspecified: Secondary | ICD-10-CM | POA: Diagnosis not present

## 2023-02-22 DIAGNOSIS — F4323 Adjustment disorder with mixed anxiety and depressed mood: Secondary | ICD-10-CM | POA: Diagnosis not present

## 2023-03-03 DIAGNOSIS — F4323 Adjustment disorder with mixed anxiety and depressed mood: Secondary | ICD-10-CM | POA: Diagnosis not present

## 2023-03-22 DIAGNOSIS — Z713 Dietary counseling and surveillance: Secondary | ICD-10-CM | POA: Diagnosis not present

## 2023-03-22 DIAGNOSIS — E669 Obesity, unspecified: Secondary | ICD-10-CM | POA: Diagnosis not present

## 2023-03-23 DIAGNOSIS — F4323 Adjustment disorder with mixed anxiety and depressed mood: Secondary | ICD-10-CM | POA: Diagnosis not present

## 2023-03-30 DIAGNOSIS — F4323 Adjustment disorder with mixed anxiety and depressed mood: Secondary | ICD-10-CM | POA: Diagnosis not present

## 2023-04-18 DIAGNOSIS — F4323 Adjustment disorder with mixed anxiety and depressed mood: Secondary | ICD-10-CM | POA: Diagnosis not present

## 2023-05-03 DIAGNOSIS — F4323 Adjustment disorder with mixed anxiety and depressed mood: Secondary | ICD-10-CM | POA: Diagnosis not present

## 2023-05-22 DIAGNOSIS — R202 Paresthesia of skin: Secondary | ICD-10-CM | POA: Diagnosis not present

## 2023-05-22 DIAGNOSIS — M5418 Radiculopathy, sacral and sacrococcygeal region: Secondary | ICD-10-CM | POA: Diagnosis not present

## 2023-05-28 ENCOUNTER — Telehealth: Payer: Self-pay | Admitting: Family Medicine

## 2023-05-28 NOTE — Telephone Encounter (Signed)
Patient called stating that discharge more than usual, pap was done at another office but come back inconclusive. She is trying to conceive but wants to make sure everything is ok with her body

## 2023-05-31 DIAGNOSIS — F4323 Adjustment disorder with mixed anxiety and depressed mood: Secondary | ICD-10-CM | POA: Diagnosis not present

## 2023-06-02 DIAGNOSIS — R42 Dizziness and giddiness: Secondary | ICD-10-CM | POA: Diagnosis not present

## 2023-06-02 DIAGNOSIS — R937 Abnormal findings on diagnostic imaging of other parts of musculoskeletal system: Secondary | ICD-10-CM | POA: Diagnosis not present

## 2023-06-02 DIAGNOSIS — M79605 Pain in left leg: Secondary | ICD-10-CM | POA: Diagnosis not present

## 2023-06-04 ENCOUNTER — Other Ambulatory Visit (HOSPITAL_COMMUNITY): Payer: Self-pay | Admitting: Internal Medicine

## 2023-06-04 DIAGNOSIS — R937 Abnormal findings on diagnostic imaging of other parts of musculoskeletal system: Secondary | ICD-10-CM

## 2023-06-07 NOTE — Telephone Encounter (Signed)
 I called patient back to discuss her request and asked what the other office explained to her about her pap being inconclusive. She said she had her period at the time and they didn't get results. I explained we can do a pap smear here but if the other office billed her that her insurance will not cover another one until it has been a year and she may have to pay a bill. I encouraged her to call the other office and see if they will redo her pap since she did not get results and I understand she wants to know everything is ok. I also asked if she wanted to do a self swab and she reports she is not having any discharge now. I encouraged her to call us back if needed. She voices understanding. Nancy Fetter

## 2023-06-09 ENCOUNTER — Ambulatory Visit: Payer: Medicaid Other | Attending: Internal Medicine

## 2023-06-09 DIAGNOSIS — R42 Dizziness and giddiness: Secondary | ICD-10-CM | POA: Diagnosis present

## 2023-06-09 DIAGNOSIS — R2681 Unsteadiness on feet: Secondary | ICD-10-CM | POA: Diagnosis present

## 2023-06-09 NOTE — Therapy (Signed)
 OUTPATIENT PHYSICAL THERAPY VESTIBULAR EVALUATION     Patient Name: Carolyn Cole MRN: [id number] DOB:02-Sep-1990, 33 y.o., female Today's Date: 06/09/2023  END OF SESSION:  PT End of Session - 06/09/23 1439     Visit Number 1    Number of Visits 17    Date for PT Re-Evaluation 08/06/23    Authorization Type [place] medicaid prepaid    Progress Note Due on Visit 10    PT Start Time 1443    PT Stop Time 1526    PT Time Calculation (min) 43 min    Activity Tolerance Patient tolerated treatment well    Behavior During Therapy WFL for tasks assessed/performed             Past Medical History:  Diagnosis Date   Anemia    Hx. of iron deficiency   Anxiety    Concussion 04/13/2010   Fibroids    Heart murmur    As an infant and it resolved   Papilledema, both eyes    Vertigo    Past Surgical History:  Procedure Laterality Date   APPENDECTOMY  2019   MYOMECTOMY N/A 11/11/2022   Procedure: ABDOMINAL MYOMECTOMY;  Surgeon: Lorriane Shire, MD;  Location: [facility] OR;  Service: Gynecology;  Laterality: N/A;   VAGINAL DELIVERY  01/29/2021   Patient Active Problem List   Diagnosis Date Noted   Fibroid 11/11/2022   Intramural and subserous leiomyoma of uterus 11/11/2022   Preeclampsia in postpartum period 02/08/2021   Encounter for induction of labor 01/28/2021   BMI 36.0-36.9,adult 01/27/2021   Fibroids 06/04/2018   Asthma 05/18/2018   Intramural leiomyoma of uterus 05/18/2018   Personal history of MRSA (methicillin resistant Staphylococcus aureus) 05/18/2018    PCP: Ardean Larsen, MD REFERRING PROVIDER: Ardean Larsen, MD  REFERRING DIAG: R42 (ICD-10-CM) - Dizziness and giddiness   THERAPY DIAG:  Unsteadiness on feet - Plan: PT plan of care cert/re-cert  Dizziness and giddiness - Plan: PT plan of care cert/re-cert  ONSET DATE: 06/03/23 referral  Rationale for Evaluation and Treatment: Rehabilitation  SUBJECTIVE:   SUBJECTIVE STATEMENT: Patient arrives to clinic  alone, no AD. Reports being dx with vertigo in 2018. Also has pseudopapilledema. "I feel weird." Sister has chiari malformation. Dizziness, light sensitivity, positional dizziness. Reports spinning dizziness that is very overwhelming that comes in spurts.  Pt accompanied by: self  PERTINENT HISTORY: vertigo, anxiety, concussion, papilledema, posteclampsia   PAIN:  Are you having pain? No  PRECAUTIONS: Fall  WEIGHT BEARING RESTRICTIONS: No  FALLS: Has patient fallen in last 6 months? No  LIVING ENVIRONMENT: Lives with: lives with their family Lives in: House/apartment Stairs: Yes: External: 2 steps; none Has following equipment at home: None  PLOF: Independent; MHT (in person and telehealth)   PATIENT GOALS: "something to manage it"  OBJECTIVE:  Note: Objective measures were completed at Evaluation unless otherwise noted.  DIAGNOSTIC FINDINGS: 04/01/21 Brain MRI: IMPRESSION:    Normal MRI brain (with and without).   COGNITION: Overall cognitive status: Within functional limits for tasks assessed   SENSATION: WFL   POSTURE:  No Significant postural limitations  Cervical ROM:   WFL and pain free  STRENGTH: WFL  BED MOBILITY:  Independent, intermittent dizziness with rolling over B, sit <>supine  GAIT: Gait pattern: WFL  PATIENT SURVEYS:  DHI 38- moderate handicap  VESTIBULAR ASSESSMENT:  GENERAL OBSERVATION: NAD, no AD   SYMPTOM BEHAVIOR:  Subjective history: see above  Non-Vestibular symptoms: headaches, tinnitus, nausea/vomiting, and migraine symptoms  Type of  dizziness: Spinning/Vertigo, Lightheadedness/Faint, and "out of it"  Frequency: 4x a day  Duration: </=1 min  Aggravating factors: Spontaneous, Induced by position change: lying supine, rolling to the right, rolling to the left, supine to sit, and sit to stand, and Induced by motion: occur when walking, bending down to the ground, turning body quickly, turning head quickly, and sitting in a  moving car  Relieving factors: dark room and closing eyes  Progression of symptoms: unchanged  OCULOMOTOR EXAM:  Ocular Alignment: normal  Ocular ROM: No Limitations  Spontaneous Nystagmus: absent  Gaze-Induced Nystagmus: absent  Smooth Pursuits: intact  Saccades: intact made patient nauseous   Convergence/Divergence: 30 cm   VESTIBULAR - OCULAR REFLEX:   Slow VOR: Positive Bilaterally  VOR Cancellation: Normal "sensory overload" "1/5 dizziness"  Head-Impulse Test: HIT Right: positive HIT Left: negative  Dynamic Visual Acuity: to be assessed   POSITIONAL TESTING: TBA PRN  MOTION SENSITIVITY:  Motion Sensitivity Quotient Intensity: 0 = none, 1 = Lightheaded, 2 = Mild, 3 = Moderate, 4 = Severe, 5 = Vomiting  Intensity  1. Sitting to supine   2. Supine to L side   3. Supine to R side   4. Supine to sitting   5. L Hallpike-Dix   6. Up from L    7. R Hallpike-Dix   8. Up from R    9. Sitting, head tipped to L knee 0  10. Head up from L knee 2  11. Sitting, head tipped to R knee 0  12. Head up from R knee 2  13. Sitting head turns x5 0  14.Sitting head nods x5 3  15. In stance, 180 turn to L  0  16. In stance, 180 turn to R 1                                                                                               TREATMENT  N/a eval  PATIENT EDUCATION: Education details: PT POC, exam findings, ddx, initial HEP  Person educated: Patient Education method: Explanation, Demonstration, and Handouts Education comprehension: verbalized understanding and needs further education  HOME EXERCISE PROGRAM: Access Code: [email or web address] URL: [email or web address] Date: 06/09/2023 Prepared by: Merry Lofty  Exercises - Seated Gaze Stabilization with Head Rotation  - 1 x daily - 7 x weekly - 3 sets - 30s hold - Seated VOR Cancellation  - 1 x daily - 7 x weekly - 3 sets - 30s hold  GOALS: Goals reviewed with patient? Yes  SHORT TERM GOALS: Target date:  07/09/23  Pt will be independent with initial HEP for improved symptom report  Baseline: to be updated Goal status: INITIAL  2.  Pt will report </= 1/5 for all movements on MSQ to indicate improvement in motion sensitivity and improved activity tolerance.   Baseline: up to 3/5 Goal status: INITIAL  3.  Patient will demonstrate a convergence of </=20cm to demonstrate improve eye coordination and ability to read Baseline: 30cm Goal status: INITIAL  4.  Patient will score </=30 on the DHI to demonstrate a reduction in symptoms/handicap  Baseline: 38-moderate Goal status: INITIAL  5. MCTSIB to be assessed and LTG written as appropriate  Baseline: to be assessed  Goal status: INITIAL  6. DVA to be assessed and LTG written as appropriate  Baseline: to be assessed  Goal status: INITIAL    LONG TERM GOALS: Target date: 08/06/23  Pt will be independent with final HEP for improved symptom report  Baseline: to be updated Goal status: INITIAL  Patient will demonstrate a convergence of </=10cm to demonstrate improve eye coordination and ability to read Baseline: 30cm Goal status: INITIAL  Patient will score </=20 on the DHI to demonstrate a reduction in symptoms/handicap  Baseline: 38-moderate Goal status: INITIAL  ASSESSMENT:  CLINICAL IMPRESSION: Patient is a 33 y.o. female who was seen today for physical therapy evaluation and treatment for dizziness. She has a long history of vertigo and a distant history of a significant concussion- for which she did not receive rehab. She presents with post concussion-like symptoms with photosensitivity, sensory overload, motion sensitivity, impaired convergence and (+) R HIT. She scored a 38 on the DHI indicating a moderate degree of handicap related to her dizziness. She would benefit from skilled PT services to address the above mentioned deficits.    OBJECTIVE IMPAIRMENTS: decreased balance, decreased knowledge of condition, and dizziness.    ACTIVITY LIMITATIONS: bending, standing, bed mobility, bathing, hygiene/grooming, locomotion level, and caring for others  PARTICIPATION LIMITATIONS: meal prep, cleaning, laundry, interpersonal relationship, shopping, community activity, occupation, and yard work  PERSONAL FACTORS: Age, Past/current experiences, Profession, Sex, Time since onset of injury/illness/exacerbation, and 1-2 comorbidities: see above  are also affecting patient's functional outcome.   REHAB POTENTIAL: Fair time since onset  CLINICAL DECISION MAKING: Evolving/moderate complexity  EVALUATION COMPLEXITY: Moderate   PLAN:  PT FREQUENCY: 1-2x/week  PT DURATION: 8 weeks  PLANNED INTERVENTIONS: 97164- PT Re-evaluation, 97110-Therapeutic exercises, 97530- Therapeutic activity, 97112- Neuromuscular re-education, 97535- Self Care, 62952- Manual therapy, [phone]- Gait training, [phone]- Orthotic Fit/training, [phone]- Canalith repositioning, [phone]- Aquatic Therapy, Patient/Family education, Balance training, Stair training, Vestibular training, Visual/preceptual remediation/compensation, Cognitive remediation, and DME instructions  PLAN FOR NEXT SESSION: expand HEP, MCTSIB and write goal, DVA and write goal, brock string   Westley Foots, PT Westley Foots, PT, DPT, CBIS  06/09/2023, 3:42 PM  For all possible CPT codes, reference the Planned Interventions line above.     Check all conditions that are expected to impact treatment: {Conditions expected to impact treatment:None of these apply   If treatment provided at initial evaluation, no treatment charged due to lack of authorization.

## 2023-06-16 ENCOUNTER — Encounter: Payer: Self-pay | Admitting: Physical Therapy

## 2023-06-16 ENCOUNTER — Ambulatory Visit: Payer: Medicaid Other | Attending: Internal Medicine | Admitting: Physical Therapy

## 2023-06-16 VITALS — BP 109/70 | HR 64

## 2023-06-16 DIAGNOSIS — R42 Dizziness and giddiness: Secondary | ICD-10-CM | POA: Insufficient documentation

## 2023-06-16 DIAGNOSIS — R2681 Unsteadiness on feet: Secondary | ICD-10-CM | POA: Diagnosis present

## 2023-06-16 NOTE — Therapy (Signed)
 OUTPATIENT PHYSICAL THERAPY VESTIBULAR EVALUATION     Patient Name: Carolyn Cole MRN: [id number] DOB:1990/08/11, 33 y.o., female Today's Date: 06/16/2023  END OF SESSION:  PT End of Session - 06/16/23 1239     Visit Number 2    Number of Visits 17    Date for PT Re-Evaluation 08/06/23    Authorization Type [place] medicaid prepaid    Progress Note Due on Visit 10    PT Start Time 1237    PT Stop Time 1316    PT Time Calculation (min) 39 min    Equipment Utilized During Treatment Gait belt    Activity Tolerance Patient tolerated treatment well    Behavior During Therapy WFL for tasks assessed/performed             Past Medical History:  Diagnosis Date   Anemia    Hx. of iron deficiency   Anxiety    Concussion 04/13/2010   Fibroids    Heart murmur    As an infant and it resolved   Papilledema, both eyes    Vertigo    Past Surgical History:  Procedure Laterality Date   APPENDECTOMY  2019   MYOMECTOMY N/A 11/11/2022   Procedure: ABDOMINAL MYOMECTOMY;  Surgeon: Lorriane Shire, MD;  Location: [facility] OR;  Service: Gynecology;  Laterality: N/A;   VAGINAL DELIVERY  01/29/2021   Patient Active Problem List   Diagnosis Date Noted   Fibroid 11/11/2022   Intramural and subserous leiomyoma of uterus 11/11/2022   Preeclampsia in postpartum period 02/08/2021   Encounter for induction of labor 01/28/2021   BMI 36.0-36.9,adult 01/27/2021   Fibroids 06/04/2018   Asthma 05/18/2018   Intramural leiomyoma of uterus 05/18/2018   Personal history of MRSA (methicillin resistant Staphylococcus aureus) 05/18/2018    PCP: Ardean Larsen, MD REFERRING PROVIDER: Ardean Larsen, MD  REFERRING DIAG: R42 (ICD-10-CM) - Dizziness and giddiness   THERAPY DIAG:  Unsteadiness on feet  Dizziness and giddiness  ONSET DATE: 06/03/23 referral  Rationale for Evaluation and Treatment: Rehabilitation  SUBJECTIVE:   SUBJECTIVE STATEMENT: Patient reports that she is doing well. Reports no  dizziness only mild light sensitivity as she forgot to bring glasses. Denies falls and near falls.    Pt accompanied by: self  PERTINENT HISTORY: vertigo, anxiety, concussion, papilledema, posteclampsia   PAIN:  Are you having pain?  Mild headache at 3/10   PRECAUTIONS: Fall  WEIGHT BEARING RESTRICTIONS: No  FALLS: Has patient fallen in last 6 months? No  LIVING ENVIRONMENT: Lives with: lives with their family Lives in: House/apartment Stairs: Yes: External: 2 steps; none Has following equipment at home: None  PLOF: Independent; MHT (in person and telehealth)   PATIENT GOALS: "something to manage it"  OBJECTIVE:  Note: Objective measures were completed at Evaluation unless otherwise noted.  DIAGNOSTIC FINDINGS: 04/01/21 Brain MRI: IMPRESSION:    Normal MRI brain (with and without).   COGNITION: Overall cognitive status: Within functional limits for tasks assessed   SENSATION: WFL   POSTURE:  No Significant postural limitations  Cervical ROM:   WFL and pain free  STRENGTH: WFL  BED MOBILITY:  Independent, intermittent dizziness with rolling over B, sit <>supine  GAIT: Gait pattern: WFL  PATIENT SURVEYS:  DHI 38- moderate handicap  VESTIBULAR ASSESSMENT:  GENERAL OBSERVATION: NAD, no AD   SYMPTOM BEHAVIOR:  Subjective history: see above  Non-Vestibular symptoms: headaches, tinnitus, nausea/vomiting, and migraine symptoms  Type of dizziness: Spinning/Vertigo, Lightheadedness/Faint, and "out of it"  Frequency: 4x a day  Duration: </=1 min  Aggravating factors: Spontaneous, Induced by position change: lying supine, rolling to the right, rolling to the left, supine to sit, and sit to stand, and Induced by motion: occur when walking, bending down to the ground, turning body quickly, turning head quickly, and sitting in a moving car  Relieving factors: dark room and closing eyes  Progression of symptoms: unchanged  OCULOMOTOR EXAM:  Ocular Alignment:  normal  Ocular ROM: No Limitations  Spontaneous Nystagmus: absent  Gaze-Induced Nystagmus: absent  Smooth Pursuits: intact  Saccades: intact made patient nauseous   Convergence/Divergence: 30 cm   VESTIBULAR - OCULAR REFLEX:   Slow VOR: Positive Bilaterally  VOR Cancellation: Normal "sensory overload" "1/5 dizziness"  Head-Impulse Test: HIT Right: positive HIT Left: negative  Dynamic Visual Acuity: to be assessed   POSITIONAL TESTING: TBA PRN  MOTION SENSITIVITY:  Motion Sensitivity Quotient Intensity: 0 = none, 1 = Lightheaded, 2 = Mild, 3 = Moderate, 4 = Severe, 5 = Vomiting  Intensity  1. Sitting to supine   2. Supine to L side   3. Supine to R side   4. Supine to sitting   5. L Hallpike-Dix   6. Up from L    7. R Hallpike-Dix   8. Up from R    9. Sitting, head tipped to L knee 0  10. Head up from L knee 2  11. Sitting, head tipped to R knee 0  12. Head up from R knee 2  13. Sitting head turns x5 0  14.Sitting head nods x5 3  15. In stance, 180 turn to L  0  16. In stance, 180 turn to R 1                                                                                               TREATMENT   NMR:  Dynamic Visual Acuity  Static: Line 11  Dynamic: Line 8  Line Difference: 3 line difference, reports dizziness increased to 4-5/10   M-CTSIB  Condition 1: Firm Surface, EO 30 Sec, Normal Sway  Condition 2: Firm Surface, EC 13 Sec, Mod sway, LOB to the L, steadies self with UE on bar  Condition 3: Foam Surface, EO 30 Sec, Normal Sway  Condition 4: Foam Surface, EC 5 Sec, Posterior LOB to the back, steadies self with UE      Pencil Pushup: Performed 3 sets of 3-5 reps, R eye with mild exotropia and some eye strain and corrective saccade to regain focus   [employer]: Convergence ~ 5 trials, able to complete but required frequent rest breaks in between, demonstrates notable eye fatigue with each rep  Self Care:  Educated on pacing with visual exercises,  recommend follow up with eye doctor as patient reports it has been over 1 year since she was last seen, educated on deficits noted in today's session and review from prior session    PATIENT EDUCATION: Education details: Continue HEP Person educated: Patient Education method: Programmer, multimedia, Facilities manager, and Handouts Education comprehension: verbalized understanding and needs further education  HOME EXERCISE PROGRAM: Access Code: [email or web address] URL: [email or web address] Date:  06/09/2023 Prepared by: Merry Lofty  Exercises - Seated Gaze Stabilization with Head Rotation  - 1 x daily - 7 x weekly - 3 sets - 30s hold - Seated VOR Cancellation  - 1 x daily - 7 x weekly - 3 sets - 30s hold  Additions: - Pencil pushups ~ 10-20 reps daily with breaks in between as needed - Brock string ~ 10 time a day with breaks as needed   GOALS: Goals reviewed with patient? Yes  SHORT TERM GOALS: Target date: 07/09/23  Pt will be independent with initial HEP for improved symptom report  Baseline: to be updated Goal status: INITIAL  2.  Pt will report </= 1/5 for all movements on MSQ to indicate improvement in motion sensitivity and improved activity tolerance.   Baseline: up to 3/5 Goal status: INITIAL  3.  Patient will demonstrate a convergence of </=20cm to demonstrate improve eye coordination and ability to read Baseline: 30cm Goal status: INITIAL  4.  Patient will score </=30 on the DHI to demonstrate a reduction in symptoms/handicap  Baseline: 38-moderate Goal status: INITIAL  5. MCTSIB to be assessed and LTG written as appropriate  Baseline: assessed on 3/5  Goal status: MET  6. DVA to be assessed and LTG written as appropriate  Baseline: assessed on 3/5  Goal status: MET    LONG TERM GOALS: Target date: 08/06/23  Pt will be independent with final HEP for improved symptom report  Baseline: to be updated Goal status: INITIAL  Patient will demonstrate a convergence  of </=10cm to demonstrate improve eye coordination and ability to read Baseline: 30cm Goal status: INITIAL  Patient will score </=20 on the DHI to demonstrate a reduction in symptoms/handicap  Baseline: 38-moderate Goal status: INITIAL  Patient will improve mCTSIB score to 98/120 to indicate improved integration of vestibular, proprioceptive, and visual balance systems during static balance tasks in order to reduce risk for falls.   Baseline: 78/120 (13 seconds condition 2, 5 seconds condition 4, full time on condition 1/3) Goal status: INITIAL  Patient will improve line difference on DVA < 3 to indicate functional norms for gaze stabilization.  Baseline: 3 line difference Goal status: INITIAL  ASSESSMENT:  CLINICAL IMPRESSION: Emphasis on skilled physical therapy services on completion of vestibular testing with emphasis on convergence tasks. Patient with abnormal score on DVA indicating abnormal gaze stabilization. Furthermore, patient indicated significant deficits on mCTSIB with eyes closed tasks indicating strong reliance on visual input and poor proprioceptive and vestibular feedback. Patient with notable eye strain and exotropia of throughout session with convergence tasks and required frequent rest breaks. Continue POC as able.  OBJECTIVE IMPAIRMENTS: decreased balance, decreased knowledge of condition, and dizziness.   ACTIVITY LIMITATIONS: bending, standing, bed mobility, bathing, hygiene/grooming, locomotion level, and caring for others  PARTICIPATION LIMITATIONS: meal prep, cleaning, laundry, interpersonal relationship, shopping, community activity, occupation, and yard work  PERSONAL FACTORS: Age, Past/current experiences, Profession, Sex, Time since onset of injury/illness/exacerbation, and 1-2 comorbidities: see above  are also affecting patient's functional outcome.   REHAB POTENTIAL: Fair time since onset  CLINICAL DECISION MAKING: Evolving/moderate  complexity  EVALUATION COMPLEXITY: Moderate   PLAN:  PT FREQUENCY: 1-2x/week  PT DURATION: 8 weeks  PLANNED INTERVENTIONS: 97164- PT Re-evaluation, 97110-Therapeutic exercises, 97530- Therapeutic activity, 97112- Neuromuscular re-education, 97535- Self Care, 16109- Manual therapy, [phone]- Gait training, [phone]- Orthotic Fit/training, [phone]- Canalith repositioning, [phone]- Aquatic Therapy, Patient/Family education, Balance training, Stair training, Vestibular training, Visual/preceptual remediation/compensation, Cognitive remediation, and DME instructions  PLAN FOR  NEXT SESSION: expand HEP - work on brock string, convergence, tape glasses, add balance work from [employer] for [employer] from home   Carmelia Bake, PT, DPT  06/16/2023, 3:24 PM  For all possible CPT codes, reference the Planned Interventions line above.     Check all conditions that are expected to impact treatment: {Conditions expected to impact treatment:None of these apply   If treatment provided at initial evaluation, no treatment charged due to lack of authorization.

## 2023-06-18 DIAGNOSIS — F4323 Adjustment disorder with mixed anxiety and depressed mood: Secondary | ICD-10-CM | POA: Diagnosis not present

## 2023-06-21 ENCOUNTER — Encounter: Payer: Self-pay | Admitting: Physical Therapy

## 2023-06-21 ENCOUNTER — Ambulatory Visit: Payer: Medicaid Other | Admitting: Physical Therapy

## 2023-06-21 VITALS — BP 106/80 | HR 76

## 2023-06-21 DIAGNOSIS — R42 Dizziness and giddiness: Secondary | ICD-10-CM

## 2023-06-21 DIAGNOSIS — R2681 Unsteadiness on feet: Secondary | ICD-10-CM

## 2023-06-21 NOTE — Therapy (Signed)
 OUTPATIENT PHYSICAL THERAPY VESTIBULAR TREATMENT     Patient Name: Carolyn Cole MRN: [id number] DOB:[birth year], 33 y.o., female Today's Date: 06/21/2023  END OF SESSION:  PT End of Session - 06/21/23 1322     Visit Number 3    Number of Visits 17    Date for PT Re-Evaluation 08/06/23    Authorization Type [place] medicaid prepaid    Progress Note Due on Visit 10    PT Start Time 1320    PT Stop Time 1355   ended a few minutes early due to patient tolerance   PT Time Calculation (min) 35 min    Equipment Utilized During Treatment Gait belt    Activity Tolerance Other (comment);Patient limited by fatigue   limited mildly by eye strain   Behavior During Therapy [facility] for tasks assessed/performed             Past Medical History:  Diagnosis Date   Anemia    Hx. of iron deficiency   Anxiety    Concussion 04/13/2010   Fibroids    Heart murmur    As an infant and it resolved   Papilledema, both eyes    Vertigo    Past Surgical History:  Procedure Laterality Date   APPENDECTOMY  2019   MYOMECTOMY N/A 11/11/2022   Procedure: ABDOMINAL MYOMECTOMY;  Surgeon: Lorriane Shire, MD;  Location: [facility] OR;  Service: Gynecology;  Laterality: N/A;   VAGINAL DELIVERY  01/29/2021   Patient Active Problem List   Diagnosis Date Noted   Fibroid 11/11/2022   Intramural and subserous leiomyoma of uterus 11/11/2022   Preeclampsia in postpartum period 02/08/2021   Encounter for induction of labor 01/28/2021   BMI 36.0-36.9,adult 01/27/2021   Fibroids 06/04/2018   Asthma 05/18/2018   Intramural leiomyoma of uterus 05/18/2018   Personal history of MRSA (methicillin resistant Staphylococcus aureus) 05/18/2018    PCP: Ardean Larsen, MD REFERRING PROVIDER: Ardean Larsen, MD  REFERRING DIAG: R42 (ICD-10-CM) - Dizziness and giddiness   THERAPY DIAG:  Unsteadiness on feet  Dizziness and giddiness  ONSET DATE: 06/03/23 referral  Rationale for Evaluation and Treatment:  Rehabilitation  SUBJECTIVE:   SUBJECTIVE STATEMENT: Patient reports that she is feeling a little better overall. She reports exercises are overall going well and better than before. Denies falls and near falls.   Pt accompanied by: self  PERTINENT HISTORY: vertigo, anxiety, concussion, papilledema, posteclampsia   PAIN:  Are you having pain?  Mild headache at 3/10   PRECAUTIONS: Fall  WEIGHT BEARING RESTRICTIONS: No  FALLS: Has patient fallen in last 6 months? No  LIVING ENVIRONMENT: Lives with: lives with their family Lives in: House/apartment Stairs: Yes: External: 2 steps; none Has following equipment at home: None  PLOF: Independent; MHT (in person and telehealth)   PATIENT GOALS: "something to manage it"  OBJECTIVE:  Note: Objective measures were completed at Evaluation unless otherwise noted.  DIAGNOSTIC FINDINGS: 04/01/21 Brain MRI: IMPRESSION:    Normal MRI brain (with and without).   COGNITION: Overall cognitive status: Within functional limits for tasks assessed   SENSATION: WFL   POSTURE:  No Significant postural limitations  Cervical ROM:   WFL and pain free  STRENGTH: WFL  BED MOBILITY:  Independent, intermittent dizziness with rolling over B, sit <>supine  GAIT: Gait pattern: WFL  PATIENT SURVEYS:  DHI 38- moderate handicap  VESTIBULAR ASSESSMENT:  GENERAL OBSERVATION: NAD, no AD   SYMPTOM BEHAVIOR:  Subjective history: see above  Non-Vestibular symptoms: headaches, tinnitus, nausea/vomiting, and migraine  symptoms  Type of dizziness: Spinning/Vertigo, Lightheadedness/Faint, and "out of it"  Frequency: 4x a day  Duration: </=1 min  Aggravating factors: Spontaneous, Induced by position change: lying supine, rolling to the right, rolling to the left, supine to sit, and sit to stand, and Induced by motion: occur when walking, bending down to the ground, turning body quickly, turning head quickly, and sitting in a moving  car  Relieving factors: dark room and closing eyes  Progression of symptoms: unchanged  OCULOMOTOR EXAM:  Ocular Alignment: normal  Ocular ROM: No Limitations  Spontaneous Nystagmus: absent  Gaze-Induced Nystagmus: absent  Smooth Pursuits: intact  Saccades: intact made patient nauseous   Convergence/Divergence: 30 cm   VESTIBULAR - OCULAR REFLEX:   Slow VOR: Positive Bilaterally  VOR Cancellation: Normal "sensory overload" "1/5 dizziness"  Head-Impulse Test: HIT Right: positive HIT Left: negative  Dynamic Visual Acuity: to be assessed   POSITIONAL TESTING: TBA PRN  MOTION SENSITIVITY:  Motion Sensitivity Quotient Intensity: 0 = none, 1 = Lightheaded, 2 = Mild, 3 = Moderate, 4 = Severe, 5 = Vomiting  Intensity  1. Sitting to supine   2. Supine to L side   3. Supine to R side   4. Supine to sitting   5. L Hallpike-Dix   6. Up from L    7. R Hallpike-Dix   8. Up from R    9. Sitting, head tipped to L knee 0  10. Head up from L knee 2  11. Sitting, head tipped to R knee 0  12. Head up from R knee 2  13. Sitting head turns x5 0  14.Sitting head nods x5 3  15. In stance, 180 turn to L  0  16. In stance, 180 turn to R 1                                                                                               TREATMENT   NMR:  Popsickle stick with salient word Convergence plus verticall saccades reading letters of word up and down 1 x 9 letters (not challenging) Convergence plus pencil pushup and vertical saccades reading letters of word 3 x 9 letters (rest breaks between sets) Convergence plus gaze stabilization with horizontal head turns 3 x 9 letters (rest breaks between sets) [place] string Ladder progressions 3 x 3 beads (rest breaks between)   Self Care:  Patient reports having blue light glasses, recommend bring to future session so can trial midline taping, explained rational behind trying this, patient verbalized understanding and agreement Educated on  pacing and visual rest breaks particularly with symptom management and HEP   Ended session a few minutes early due to patient reaching maximal fatigue; educated on longer rest breaks at home  PATIENT EDUCATION: Education details: Continue HEP Person educated: Patient Education method: Programmer, multimedia, Facilities manager, and Handouts Education comprehension: verbalized understanding and needs further education  HOME EXERCISE PROGRAM: Access Code: [email or web address] URL: [email or web address] Date: 06/09/2023 Prepared by: Merry Lofty  Exercises - Seated Gaze Stabilization with Head Rotation  - 1 x daily - 7 x weekly - 3 sets - 30s hold -  Seated VOR Cancellation  - 1 x daily - 7 x weekly - 3 sets - 30s hold  Additions: - Pencil pushups ~ 10-20 reps daily with breaks in between as needed - Brock string ~ 10 time a day with breaks as needed   Chocolate popsicle stick exercise  Reading up and down stick bringing it close to nose and back 3 reps a day Reading up and down stick with slow head turns keeping eyes fixed on letters 3 reps a day  GOALS: Goals reviewed with patient? Yes  SHORT TERM GOALS: Target date: 07/09/23  Pt will be independent with initial HEP for improved symptom report  Baseline: to be updated Goal status: INITIAL  2.  Pt will report </= 1/5 for all movements on MSQ to indicate improvement in motion sensitivity and improved activity tolerance.   Baseline: up to 3/5 Goal status: INITIAL  3.  Patient will demonstrate a convergence of </=20cm to demonstrate improve eye coordination and ability to read Baseline: 30cm Goal status: INITIAL  4.  Patient will score </=30 on the DHI to demonstrate a reduction in symptoms/handicap  Baseline: 38-moderate Goal status: INITIAL  5. MCTSIB to be assessed and LTG written as appropriate  Baseline: assessed on 3/5  Goal status: MET  6. DVA to be assessed and LTG written as appropriate  Baseline: assessed on 3/5  Goal  status: MET    LONG TERM GOALS: Target date: 08/06/23  Pt will be independent with final HEP for improved symptom report  Baseline: to be updated Goal status: INITIAL  Patient will demonstrate a convergence of </=10cm to demonstrate improve eye coordination and ability to read Baseline: 30cm Goal status: INITIAL  Patient will score </=20 on the DHI to demonstrate a reduction in symptoms/handicap  Baseline: 38-moderate Goal status: INITIAL  Patient will improve mCTSIB score to 98/120 to indicate improved integration of vestibular, proprioceptive, and visual balance systems during static balance tasks in order to reduce risk for falls.   Baseline: 78/120 (13 seconds condition 2, 5 seconds condition 4, full time on condition 1/3) Goal status: INITIAL  Patient will improve line difference on DVA < 3 to indicate functional norms for gaze stabilization.  Baseline: 3 line difference Goal status: INITIAL  ASSESSMENT:  CLINICAL IMPRESSION: Emphasis on skilled physical therapy session on progressing occulor/vestibular exercises. Patient required numerous rest breaks between each exercise with session ended a few minutes early for tolerance. Encouraged patient on follow up again with eye doctor and discussed pacing with exercises at home as patient is building tolerance. Continue POC as able.  OBJECTIVE IMPAIRMENTS: decreased balance, decreased knowledge of condition, and dizziness.   ACTIVITY LIMITATIONS: bending, standing, bed mobility, bathing, hygiene/grooming, locomotion level, and caring for others  PARTICIPATION LIMITATIONS: meal prep, cleaning, laundry, interpersonal relationship, shopping, community activity, occupation, and yard work  PERSONAL FACTORS: Age, Past/current experiences, Profession, Sex, Time since onset of injury/illness/exacerbation, and 1-2 comorbidities: see above  are also affecting patient's functional outcome.   REHAB POTENTIAL: Fair time since  onset  CLINICAL DECISION MAKING: Evolving/moderate complexity  EVALUATION COMPLEXITY: Moderate   PLAN:  PT FREQUENCY: 1-2x/week  PT DURATION: 8 weeks  PLANNED INTERVENTIONS: 97164- PT Re-evaluation, 97110-Therapeutic exercises, 97530- Therapeutic activity, 97112- Neuromuscular re-education, 97535- Self Care, 65784- Manual therapy, [phone]- Gait training, [phone]- Orthotic Fit/training, [phone]- Canalith repositioning, [phone]- Aquatic Therapy, Patient/Family education, Balance training, Stair training, Vestibular training, Visual/preceptual remediation/compensation, Cognitive remediation, and DME instructions  PLAN FOR NEXT SESSION: expand HEP - work on Education officer, museum,  convergence, tape glasses, add balance work from [employer] for corner balance for home, progress balance work with HART chart next session    Carmelia Bake, PT, DPT  06/21/2023, 3:55 PM  For all possible CPT codes, reference the Planned Interventions line above.     Check all conditions that are expected to impact treatment: {Conditions expected to impact treatment:None of these apply   If treatment provided at initial evaluation, no treatment charged due to lack of authorization.

## 2023-06-23 ENCOUNTER — Ambulatory Visit (HOSPITAL_COMMUNITY)
Admission: RE | Admit: 2023-06-23 | Discharge: 2023-06-23 | Disposition: A | Discharge status: Home / Self Care | Payer: Medicaid Other | Source: Ambulatory Visit | Attending: Internal Medicine | Admitting: Internal Medicine

## 2023-06-23 ENCOUNTER — Encounter (HOSPITAL_COMMUNITY)
Admission: RE | Admit: 2023-06-23 | Discharge: 2023-06-23 | Disposition: A | Discharge status: Home / Self Care | Payer: Medicaid Other | Source: Ambulatory Visit | Attending: Internal Medicine | Admitting: Internal Medicine

## 2023-06-23 DIAGNOSIS — R937 Abnormal findings on diagnostic imaging of other parts of musculoskeletal system: Secondary | ICD-10-CM | POA: Insufficient documentation

## 2023-06-23 DIAGNOSIS — M898X9 Other specified disorders of bone, unspecified site: Secondary | ICD-10-CM | POA: Diagnosis not present

## 2023-06-23 LAB — PREGNANCY, URINE: Preg Test, Ur: NEGATIVE

## 2023-06-23 MED ORDER — TECHNETIUM TC 99M MEDRONATE IV KIT
21.3000 | PACK | Freq: Once | INTRAVENOUS | Status: AC
  Administered 2023-06-23: 21.3 via INTRAVENOUS

## 2023-06-30 ENCOUNTER — Ambulatory Visit: Payer: Medicaid Other | Admitting: Physical Therapy

## 2023-06-30 DIAGNOSIS — G43909 Migraine, unspecified, not intractable, without status migrainosus: Secondary | ICD-10-CM | POA: Diagnosis not present

## 2023-07-02 ENCOUNTER — Ambulatory Visit: Admitting: Physical Therapy

## 2023-07-02 ENCOUNTER — Encounter: Payer: Self-pay | Admitting: Physical Therapy

## 2023-07-02 DIAGNOSIS — R2681 Unsteadiness on feet: Secondary | ICD-10-CM | POA: Diagnosis not present

## 2023-07-02 DIAGNOSIS — R42 Dizziness and giddiness: Secondary | ICD-10-CM

## 2023-07-02 NOTE — Therapy (Signed)
 OUTPATIENT PHYSICAL THERAPY VESTIBULAR TREATMENT     Patient Name: Carolyn Cole MRN: [id number] DOB:1990/05/17, 33 y.o., female Today's Date: 07/02/2023  END OF SESSION:  PT End of Session - 07/02/23 0850     Visit Number 4    Number of Visits 17    Date for PT Re-Evaluation 08/06/23    Authorization Type [place] medicaid prepaid    Progress Note Due on Visit 10    PT Start Time 0850    PT Stop Time 0930    PT Time Calculation (min) 40 min    Equipment Utilized During Treatment Gait belt    Activity Tolerance Patient tolerated treatment well    Behavior During Therapy WFL for tasks assessed/performed             Past Medical History:  Diagnosis Date   Anemia    Hx. of iron deficiency   Anxiety    Concussion 04/13/2010   Fibroids    Heart murmur    As an infant and it resolved   Papilledema, both eyes    Vertigo    Past Surgical History:  Procedure Laterality Date   APPENDECTOMY  2019   MYOMECTOMY N/A 11/11/2022   Procedure: ABDOMINAL MYOMECTOMY;  Surgeon: Lorriane Shire, MD;  Location: [facility] OR;  Service: Gynecology;  Laterality: N/A;   VAGINAL DELIVERY  01/29/2021   Patient Active Problem List   Diagnosis Date Noted   Fibroid 11/11/2022   Intramural and subserous leiomyoma of uterus 11/11/2022   Preeclampsia in postpartum period 02/08/2021   Encounter for induction of labor 01/28/2021   BMI 36.0-36.9,adult 01/27/2021   Fibroids 06/04/2018   Asthma 05/18/2018   Intramural leiomyoma of uterus 05/18/2018   Personal history of MRSA (methicillin resistant Staphylococcus aureus) 05/18/2018    PCP: Ardean Larsen, MD REFERRING PROVIDER: Ardean Larsen, MD  REFERRING DIAG: R42 (ICD-10-CM) - Dizziness and giddiness   THERAPY DIAG:  Unsteadiness on feet  Dizziness and giddiness  ONSET DATE: 06/03/23 referral  Rationale for Evaluation and Treatment: Rehabilitation  SUBJECTIVE:   SUBJECTIVE STATEMENT: Patient went to eye doctor yesterday. Was told that she  does have mild exotropia of L eye but eye doctor's assessed visual tracking and reported no major concerns. She also had eye dilated and there were no major concerns. Patient reports that her exercises at home are still quite difficult particularly with brock string. Patient denies falls and near falls.    Pt accompanied by: self  PERTINENT HISTORY: vertigo, anxiety, concussion, papilledema, posteclampsia   PAIN:  Are you having pain? No  PRECAUTIONS: Fall  WEIGHT BEARING RESTRICTIONS: No  FALLS: Has patient fallen in last 6 months? No  LIVING ENVIRONMENT: Lives with: lives with their family Lives in: House/apartment Stairs: Yes: External: 2 steps; none Has following equipment at home: None  PLOF: Independent; MHT (in person and telehealth)   PATIENT GOALS: "something to manage it"  OBJECTIVE:  Note: Objective measures were completed at Evaluation unless otherwise noted.  DIAGNOSTIC FINDINGS: 04/01/21 Brain MRI: IMPRESSION:    Normal MRI brain (with and without).   COGNITION: Overall cognitive status: Within functional limits for tasks assessed   SENSATION: WFL   POSTURE:  No Significant postural limitations  Cervical ROM:   WFL and pain free  STRENGTH: WFL  BED MOBILITY:  Independent, intermittent dizziness with rolling over B, sit <>supine  GAIT: Gait pattern: WFL  PATIENT SURVEYS:  DHI 38- moderate handicap  VESTIBULAR ASSESSMENT:  GENERAL OBSERVATION: NAD, no AD   SYMPTOM BEHAVIOR:  Subjective history: see above  Non-Vestibular symptoms: headaches, tinnitus, nausea/vomiting, and migraine symptoms  Type of dizziness: Spinning/Vertigo, Lightheadedness/Faint, and "out of it"  Frequency: 4x a day  Duration: </=1 min  Aggravating factors: Spontaneous, Induced by position change: lying supine, rolling to the right, rolling to the left, supine to sit, and sit to stand, and Induced by motion: occur when walking, bending down to the ground, turning body  quickly, turning head quickly, and sitting in a moving car  Relieving factors: dark room and closing eyes  Progression of symptoms: unchanged  OCULOMOTOR EXAM:  Ocular Alignment: normal  Ocular ROM: No Limitations  Spontaneous Nystagmus: absent  Gaze-Induced Nystagmus: absent  Smooth Pursuits: intact  Saccades: intact made patient nauseous   Convergence/Divergence: 30 cm   VESTIBULAR - OCULAR REFLEX:   Slow VOR: Positive Bilaterally  VOR Cancellation: Normal "sensory overload" "1/5 dizziness"  Head-Impulse Test: HIT Right: positive HIT Left: negative  Dynamic Visual Acuity: to be assessed   POSITIONAL TESTING: TBA PRN  MOTION SENSITIVITY:  Motion Sensitivity Quotient Intensity: 0 = none, 1 = Lightheaded, 2 = Mild, 3 = Moderate, 4 = Severe, 5 = Vomiting  Intensity  1. Sitting to supine   2. Supine to L side   3. Supine to R side   4. Supine to sitting   5. L Hallpike-Dix   6. Up from L    7. R Hallpike-Dix   8. Up from R    9. Sitting, head tipped to L knee 0  10. Head up from L knee 2  11. Sitting, head tipped to R knee 0  12. Head up from R knee 2  13. Sitting head turns x5 0  14.Sitting head nods x5 3  15. In stance, 180 turn to L  0  16. In stance, 180 turn to R 1                                                                                               TREATMENT   TherAct: Patient arrives to session with blue light glasses; PT measures outside edge of iris and tapes glasses for binasal occlusion to assist in strengthening peripheral vision, reducing visual noise, educated patient on wear scheduling with target of wearing at all times for next two weeks   Taped glasses donned for remainder of session  NMR:  Mal Amabile string Ladder progressions 3 x 3 beads (rest breaks between, patient with difficulty with middle bead in today's session)  Popping bumbles for convergence, gaze stabilization, and visual tracking  Eye level x 5 rounds in standing (reports greater  challenge when bumbles start to move down) Bending over and popping bubbles x 4 rounds (reports greater challenge with this task) Picking up squigz off towel on floor and turning clockwise and counterclockwise to mirror and placing squigz  Self-Selected speed: reports symptoms up to 4 from baseline 0 after 7 squigz Cued on pacing to keep symptoms 3 or below, able to pickup 8 squigz Use of gaze stabilization technique on final round, reported reduction in symptoms completed 6 pickups before ended due to time constraints of  session  PATIENT EDUCATION: Education details: Continue HEP Person educated: Patient Education method: Explanation, Demonstration, and Handouts Education comprehension: verbalized understanding and needs further education  HOME EXERCISE PROGRAM: Access Code: [email or web address] URL: [email or web address] Date: 06/09/2023 Prepared by: Merry Lofty  Exercises - Seated Gaze Stabilization with Head Rotation  - 1 x daily - 7 x weekly - 3 sets - 30s hold - Seated VOR Cancellation  - 1 x daily - 7 x weekly - 3 sets - 30s hold  Additions: - Pencil pushups ~ 10-20 reps daily with breaks in between as needed - Brock string ~ 10 time a day with breaks as needed   Chocolate popsicle stick exercise  Reading up and down stick bringing it close to nose and back 3 reps a day Reading up and down stick with slow head turns keeping eyes fixed on letters 3 reps a day  GOALS: Goals reviewed with patient? Yes  SHORT TERM GOALS: Target date: 07/09/23  Pt will be independent with initial HEP for improved symptom report  Baseline: to be updated Goal status: INITIAL  2.  Pt will report </= 1/5 for all movements on MSQ to indicate improvement in motion sensitivity and improved activity tolerance.   Baseline: up to 3/5 Goal status: INITIAL  3.  Patient will demonstrate a convergence of </=20cm to demonstrate improve eye coordination and ability to read Baseline: 30cm Goal  status: INITIAL  4.  Patient will score </=30 on the DHI to demonstrate a reduction in symptoms/handicap  Baseline: 38-moderate Goal status: INITIAL  5. MCTSIB to be assessed and LTG written as appropriate  Baseline: assessed on 3/5  Goal status: MET  6. DVA to be assessed and LTG written as appropriate  Baseline: assessed on 3/5  Goal status: MET    LONG TERM GOALS: Target date: 08/06/23  Pt will be independent with final HEP for improved symptom report  Baseline: to be updated Goal status: INITIAL  Patient will demonstrate a convergence of </=10cm to demonstrate improve eye coordination and ability to read Baseline: 30cm Goal status: INITIAL  Patient will score </=20 on the DHI to demonstrate a reduction in symptoms/handicap  Baseline: 38-moderate Goal status: INITIAL  Patient will improve mCTSIB score to 98/120 to indicate improved integration of vestibular, proprioceptive, and visual balance systems during static balance tasks in order to reduce risk for falls.   Baseline: 78/120 (13 seconds condition 2, 5 seconds condition 4, full time on condition 1/3) Goal status: INITIAL  Patient will improve line difference on DVA < 3 to indicate functional norms for gaze stabilization.  Baseline: 3 line difference Goal status: INITIAL  ASSESSMENT:  CLINICAL IMPRESSION: Emphasis on skilled physical therapy session on initiating trial of binasal occlusion for management of visual symptoms. Patient noted to be very symptomatic to looking down tasks so initiated for habituation; plan to add to HEP next session. Improved response with compensatory gaze stabilization technique. Continue POC as able.  OBJECTIVE IMPAIRMENTS: decreased balance, decreased knowledge of condition, and dizziness.   ACTIVITY LIMITATIONS: bending, standing, bed mobility, bathing, hygiene/grooming, locomotion level, and caring for others  PARTICIPATION LIMITATIONS: meal prep, cleaning, laundry, interpersonal  relationship, shopping, community activity, occupation, and yard work  PERSONAL FACTORS: Age, Past/current experiences, Profession, Sex, Time since onset of injury/illness/exacerbation, and 1-2 comorbidities: see above  are also affecting patient's functional outcome.   REHAB POTENTIAL: Fair time since onset  CLINICAL DECISION MAKING: Evolving/moderate complexity  EVALUATION COMPLEXITY: Moderate   PLAN:  PT FREQUENCY: 1-2x/week  PT  DURATION: 8 weeks  PLANNED INTERVENTIONS: 97164- PT Re-evaluation, 97110-Therapeutic exercises, 97530- Therapeutic activity, 97112- Neuromuscular re-education, [phone]- Self Care, 30865- Manual therapy, [phone]- Gait training, [phone]- Orthotic Fit/training, [phone]- Canalith repositioning, [phone]- Aquatic Therapy, Patient/Family education, Balance training, Stair training, Vestibular training, Visual/preceptual remediation/compensation, Cognitive remediation, and DME instructions  PLAN FOR NEXT SESSION: expand HEP - work on brock string, convergence, how is binasal occlusion going, add balance work from [employer] for corner balance for home, progress balance work with HART chart next session, work on adding pickup tasks for home, assess STGs    Carmelia Bake, PT, DPT  07/02/2023, 11:25 AM  For all possible CPT codes, reference the Planned Interventions line above.     Check all conditions that are expected to impact treatment: {Conditions expected to impact treatment:None of these apply   If treatment provided at initial evaluation, no treatment charged due to lack of authorization.

## 2023-07-07 ENCOUNTER — Encounter: Payer: Self-pay | Admitting: Physical Therapy

## 2023-07-07 ENCOUNTER — Ambulatory Visit: Payer: Medicaid Other | Admitting: Physical Therapy

## 2023-07-07 VITALS — BP 110/69 | HR 67

## 2023-07-07 DIAGNOSIS — R2681 Unsteadiness on feet: Secondary | ICD-10-CM | POA: Diagnosis not present

## 2023-07-07 DIAGNOSIS — R42 Dizziness and giddiness: Secondary | ICD-10-CM

## 2023-07-07 NOTE — Therapy (Signed)
 OUTPATIENT PHYSICAL THERAPY VESTIBULAR TREATMENT     Patient Name: Carolyn Cole MRN: [id number] DOB:1990/04/17, 33 y.o., female Today's Date: 07/07/2023  END OF SESSION:  PT End of Session - 07/07/23 1236     Visit Number 5    Number of Visits 17    Date for PT Re-Evaluation 08/06/23    Authorization Type [place] medicaid prepaid    Progress Note Due on Visit 10    PT Start Time 1234    PT Stop Time 1315    PT Time Calculation (min) 41 min    Equipment Utilized During Treatment Gait belt    Activity Tolerance Patient tolerated treatment well    Behavior During Therapy WFL for tasks assessed/performed             Past Medical History:  Diagnosis Date   Anemia    Hx. of iron deficiency   Anxiety    Concussion 04/13/2010   Fibroids    Heart murmur    As an infant and it resolved   Papilledema, both eyes    Vertigo    Past Surgical History:  Procedure Laterality Date   APPENDECTOMY  2019   MYOMECTOMY N/A 11/11/2022   Procedure: ABDOMINAL MYOMECTOMY;  Surgeon: Lorriane Shire, MD;  Location: [facility] OR;  Service: Gynecology;  Laterality: N/A;   VAGINAL DELIVERY  01/29/2021   Patient Active Problem List   Diagnosis Date Noted   Fibroid 11/11/2022   Intramural and subserous leiomyoma of uterus 11/11/2022   Preeclampsia in postpartum period 02/08/2021   Encounter for induction of labor 01/28/2021   BMI 36.0-36.9,adult 01/27/2021   Fibroids 06/04/2018   Asthma 05/18/2018   Intramural leiomyoma of uterus 05/18/2018   Personal history of MRSA (methicillin resistant Staphylococcus aureus) 05/18/2018    PCP: Ardean Larsen, MD REFERRING PROVIDER: Ardean Larsen, MD  REFERRING DIAG: R42 (ICD-10-CM) - Dizziness and giddiness   THERAPY DIAG:  Unsteadiness on feet  Dizziness and giddiness  ONSET DATE: 06/03/23 referral  Rationale for Evaluation and Treatment: Rehabilitation  SUBJECTIVE:   SUBJECTIVE STATEMENT: Patient reports that she has been wearing glasses. She  reports that it has been helpful when she is wearing them but reports that is having a hard time looking down. She only missed wearing them one day. Patient reports that she has not been able to do too many of exercises. Denies falls and near falls.    Pt accompanied by: self  PERTINENT HISTORY: vertigo, anxiety, concussion, papilledema, posteclampsia   PAIN:  Are you having pain? No  PRECAUTIONS: Fall  WEIGHT BEARING RESTRICTIONS: No  FALLS: Has patient fallen in last 6 months? No  LIVING ENVIRONMENT: Lives with: lives with their family Lives in: House/apartment Stairs: Yes: External: 2 steps; none Has following equipment at home: None  PLOF: Independent; MHT (in person and telehealth)   PATIENT GOALS: "something to manage it"  OBJECTIVE:  Note: Objective measures were completed at Evaluation unless otherwise noted.  DIAGNOSTIC FINDINGS: 04/01/21 Brain MRI: IMPRESSION:    Normal MRI brain (with and without).   COGNITION: Overall cognitive status: Within functional limits for tasks assessed  TREATMENT   Patient arrives to session with blue light glasses and binasal occlusion taped from prior session; donned for majority of session other than testing from eval  NMR:   Between // bars [place] eye  Standing on airex cushion without eye occlusion, NBOS: 1 min 19 seconds Standing on black foam NBOS without eye occlusion: 1 min 23 seconds 1 error Standing on black foam NBOS without R eye occlusion with card behind glasses: 1 min 26 seconds 1 error Standing on rocker board in A/P direction with twisting to cone and putting cone down on the floor x 5 cones each side with CGA  TherAct:  Short Term Goal Check Dizziness Handicap Inventory: 24 Convergence: 10 cm  MOTION SENSITIVITY:            Motion Sensitivity Quotient Intensity: 0 = none, 1 = Lightheaded, 2 = Mild, 3 =  Moderate, 4 = Severe, 5 = Vomiting   Intensity Eval Intensity 3/26 STG Check  9. Sitting, head tipped to L knee 0 0  10. Head up from L knee 2 1  11. Sitting, head tipped to R knee 0 1  12. Head up from R knee 2 1  13. Sitting head turns x5 0 2  14.Sitting head nods x5 3 1  15. In stance, 180 turn to L  0 1  16. In stance, 180 turn to R 1 0                                   PATIENT EDUCATION: Education details: Continue HEP + short term goal progress  Person educated: Patient Education method: Explanation, Demonstration, and Handouts Education comprehension: verbalized understanding and needs further education  HOME EXERCISE PROGRAM: Access Code: [email or web address] URL: [email or web address] Date: 06/09/2023 Prepared by: Merry Lofty  Exercises - Seated Gaze Stabilization with Head Rotation  - 1 x daily - 7 x weekly - 3 sets - 30s hold - Seated VOR Cancellation  - 1 x daily - 7 x weekly - 3 sets - 30s hold  Additions: - Pencil pushups ~ 10-20 reps daily with breaks in between as needed - Brock string ~ 10 time a day with breaks as needed   Chocolate popsicle stick exercise  Reading up and down stick bringing it close to nose and back 3 reps a day Reading up and down stick with slow head turns keeping eyes fixed on letters 3 reps a day  GOALS: Goals reviewed with patient? Yes  SHORT TERM GOALS: Target date: 07/09/23  Pt will be independent with initial HEP for improved symptom report  Baseline: to be updated, reports independence with exercises so far Goal status: MET  2.  Pt will report </= 1/5 for all movements on MSQ to indicate improvement in motion sensitivity and improved activity tolerance.   Baseline: up to 3/5, worse is 2/5 only on horizontal head turns Goal status: NOT MET but progressing   3.  Patient will demonstrate a convergence of </=20cm to demonstrate improve eye coordination and ability to read Baseline: 30cm; 10 cm  Goal status: MET  4.   Patient will score </=30 on the DHI to demonstrate a reduction in symptoms/handicap  Baseline: 38-moderate, 24-moderate Goal status: MET  5. MCTSIB to be assessed and LTG written as appropriate  Baseline: assessed on 3/5  Goal status: MET  6. DVA to be assessed and LTG written as appropriate  Baseline: assessed on  3/5  Goal status: MET    LONG TERM GOALS: Target date: 08/06/23  Pt will be independent with final HEP for improved symptom report  Baseline: to be updated Goal status: INITIAL  Patient will demonstrate a convergence of </=10cm to demonstrate improve eye coordination and ability to read Baseline: 30cm, 10 cm Goal status: IN PROGRESS   Patient will score </=20 on the DHI to demonstrate a reduction in symptoms/handicap  Baseline: 38-moderate; improved to 24  Goal status: IN PROGRESS  Patient will improve mCTSIB score to 98/120 to indicate improved integration of vestibular, proprioceptive, and visual balance systems during static balance tasks in order to reduce risk for falls.   Baseline: 78/120 (13 seconds condition 2, 5 seconds condition 4, full time on condition 1/3) Goal status: INITIAL  Patient will improve line difference on DVA < 3 to indicate functional norms for gaze stabilization.  Baseline: 3 line difference Goal status: INITIAL  ASSESSMENT:  CLINICAL IMPRESSION: Emphasis on skilled physical therapy session on continuing to work on binasal occlusion with various visual tracking tasks, assessment of short term goals, and looking down with balance on unlevel surface/ Patient progressing excellently on STG with notable improvements on DHI; while did not quite achieve MSQ, motion sensitivity largely improved. Continue POC as able.  OBJECTIVE IMPAIRMENTS: decreased balance, decreased knowledge of condition, and dizziness.   ACTIVITY LIMITATIONS: bending, standing, bed mobility, bathing, hygiene/grooming, locomotion level, and caring for others  PARTICIPATION  LIMITATIONS: meal prep, cleaning, laundry, interpersonal relationship, shopping, community activity, occupation, and yard work  PERSONAL FACTORS: Age, Past/current experiences, Profession, Sex, Time since onset of injury/illness/exacerbation, and 1-2 comorbidities: see above  are also affecting patient's functional outcome.   REHAB POTENTIAL: Fair time since onset  CLINICAL DECISION MAKING: Evolving/moderate complexity  EVALUATION COMPLEXITY: Moderate   PLAN:  PT FREQUENCY: 1-2x/week  PT DURATION: 8 weeks  PLANNED INTERVENTIONS: 97164- PT Re-evaluation, 97110-Therapeutic exercises, 97530- Therapeutic activity, 97112- Neuromuscular re-education, 97535- Self Care, 29562- Manual therapy, [phone]- Gait training, [phone]- Orthotic Fit/training, [phone]- Canalith repositioning, [phone]- Aquatic Therapy, Patient/Family education, Balance training, Stair training, Vestibular training, Visual/preceptual remediation/compensation, Cognitive remediation, and DME instructions  PLAN FOR NEXT SESSION: expand HEP - work on brock string, convergence, how is binasal occlusion going, add balance work from [employer] for corner balance for home, progress balance work with HART chart next session, work on adding pickup tasks for home  Carmelia Bake, PT, DPT  07/07/2023, 2:26 PM  For all possible CPT codes, reference the Planned Interventions line above.     Check all conditions that are expected to impact treatment: {Conditions expected to impact treatment:None of these apply   If treatment provided at initial evaluation, no treatment charged due to lack of authorization.

## 2023-07-08 DIAGNOSIS — E6609 Other obesity due to excess calories: Secondary | ICD-10-CM | POA: Diagnosis not present

## 2023-07-08 DIAGNOSIS — Z6838 Body mass index (BMI) 38.0-38.9, adult: Secondary | ICD-10-CM | POA: Diagnosis not present

## 2023-07-08 DIAGNOSIS — Z713 Dietary counseling and surveillance: Secondary | ICD-10-CM | POA: Diagnosis not present

## 2023-07-14 ENCOUNTER — Encounter: Payer: Self-pay | Admitting: Physical Therapy

## 2023-07-14 ENCOUNTER — Ambulatory Visit: Payer: Medicaid Other | Attending: Internal Medicine | Admitting: Physical Therapy

## 2023-07-14 VITALS — BP 115/62 | HR 68

## 2023-07-14 DIAGNOSIS — R42 Dizziness and giddiness: Secondary | ICD-10-CM | POA: Diagnosis present

## 2023-07-14 DIAGNOSIS — R2681 Unsteadiness on feet: Secondary | ICD-10-CM | POA: Diagnosis present

## 2023-07-14 DIAGNOSIS — M5459 Other low back pain: Secondary | ICD-10-CM | POA: Insufficient documentation

## 2023-07-14 NOTE — Therapy (Signed)
 OUTPATIENT PHYSICAL THERAPY VESTIBULAR TREATMENT     Patient Name: Carolyn Cole MRN: [id number] DOB:[birth year], 33 y.o., female Today's Date: 07/14/2023  END OF SESSION:  PT End of Session - 07/14/23 1238     Visit Number 6    Number of Visits 17    Date for PT Re-Evaluation 08/06/23    Authorization Type [place] medicaid prepaid    Progress Note Due on Visit 10    PT Start Time 1236    PT Stop Time 1315    PT Time Calculation (min) 39 min    Equipment Utilized During Treatment Gait belt    Activity Tolerance Treatment limited secondary to medical complications (Comment)   headache and lightheadness with BP drop   Behavior During Therapy WFL for tasks assessed/performed             Past Medical History:  Diagnosis Date   Anemia    Hx. of iron deficiency   Anxiety    Concussion 04/13/2010   Fibroids    Heart murmur    As an infant and it resolved   Papilledema, both eyes    Vertigo    Past Surgical History:  Procedure Laterality Date   APPENDECTOMY  2019   MYOMECTOMY N/A 11/11/2022   Procedure: ABDOMINAL MYOMECTOMY;  Surgeon: Lorriane Shire, MD;  Location: [facility] OR;  Service: Gynecology;  Laterality: N/A;   VAGINAL DELIVERY  01/29/2021   Patient Active Problem List   Diagnosis Date Noted   Fibroid 11/11/2022   Intramural and subserous leiomyoma of uterus 11/11/2022   Preeclampsia in postpartum period 02/08/2021   Encounter for induction of labor 01/28/2021   BMI 36.0-36.9,adult 01/27/2021   Fibroids 06/04/2018   Asthma 05/18/2018   Intramural leiomyoma of uterus 05/18/2018   Personal history of MRSA (methicillin resistant Staphylococcus aureus) 05/18/2018    PCP: Ardean Larsen, MD REFERRING PROVIDER: Ardean Larsen, MD  REFERRING DIAG: R42 (ICD-10-CM) - Dizziness and giddiness   THERAPY DIAG:  Unsteadiness on feet  Dizziness and giddiness  ONSET DATE: 06/03/23 referral  Rationale for Evaluation and Treatment: Rehabilitation  SUBJECTIVE:    SUBJECTIVE STATEMENT: Patient reports she woke up with bad headache. She reports otherwise she has been feeling better and has not had any major dizziness. She wants to try to continue with therapy today. Denies falls and near falls since last here. Patient reports her exercises have been going good at home too.    Pt accompanied by: self  PERTINENT HISTORY: vertigo, anxiety, concussion, papilledema, posteclampsia   PAIN:  Are you having pain?  4/10 - headache  PRECAUTIONS: Fall  WEIGHT BEARING RESTRICTIONS: No  FALLS: Has patient fallen in last 6 months? No  LIVING ENVIRONMENT: Lives with: lives with their family Lives in: House/apartment Stairs: Yes: External: 2 steps; none Has following equipment at home: None  PLOF: Independent; MHT (in person and telehealth)   PATIENT GOALS: "something to manage it"  OBJECTIVE:  Note: Objective measures were completed at Evaluation unless otherwise noted.  DIAGNOSTIC FINDINGS: 04/01/21 Brain MRI: IMPRESSION:    Normal MRI brain (with and without).   COGNITION: Overall cognitive status: Within functional limits for tasks assessed  TREATMENT      07/14/2023    1:15 PM 07/14/2023    1:09 PM 07/14/2023   12:48 PM  Vitals with BMI  Systolic 115 121 098  Diastolic 62 62 85  Pulse 68 69 71       End of session  Mid of session  Start of session  Self Care: Assessed vitals as noted above, patient with major diastolic drop of unclear origin but does report some lightheadedness, advised follow up with PCP for second opinion and will monitor closely in future sessions  TherAct: Reports wanting to try session without glasses, reports continuing to feel better overall and not wearing at home Educated patient on how to have partner help tape for home if needs to return to binasal occlusion in future Partway through session due to patient tolerance  retaped binasal occlusion for patient  NMR:  Standing on airex in corner of room with NBOS with chair in front reading diagonal up to left and down to left 2 rounds of each exercise (SBA) Reports mild levels of dizziness after each round  Standing on rocker board reaching and grabbing cards on counter with looking side to side  After completing approximately 7 cards side to side required prolonged break and adding binasal occlusion Able to tolerate ~ 8 cards with addition of taping but then when tried to return hit peak symptoms at 4/5 - required seated eyes closed break for remainder of session                                   PATIENT EDUCATION: Education details: Follow up with PCP + continue HEP if asymptomatic for lightheadedness  Person educated: Patient Education method: Explanation, Demonstration, and Handouts Education comprehension: verbalized understanding and needs further education  HOME EXERCISE PROGRAM: Access Code: [email or web address] URL: [email or web address] Date: 06/09/2023 Prepared by: Merry Lofty  Exercises - Seated Gaze Stabilization with Head Rotation  - 1 x daily - 7 x weekly - 3 sets - 30s hold - Seated VOR Cancellation  - 1 x daily - 7 x weekly - 3 sets - 30s hold  Additions: - Pencil pushups ~ 10-20 reps daily with breaks in between as needed - Brock string ~ 10 time a day with breaks as needed  - HART chart 2-3 rounds a day  Chocolate popsicle stick exercise  Reading up and down stick bringing it close to nose and back 3 reps a day Reading up and down stick with slow head turns keeping eyes fixed on letters 3 reps a day  GOALS: Goals reviewed with patient? Yes  SHORT TERM GOALS: Target date: 07/09/23  Pt will be independent with initial HEP for improved symptom report  Baseline: to be updated, reports independence with exercises so far Goal status: MET  2.  Pt will report </= 1/5 for all movements on MSQ to indicate improvement in motion  sensitivity and improved activity tolerance.   Baseline: up to 3/5, worse is 2/5 only on horizontal head turns Goal status: NOT MET but progressing   3.  Patient will demonstrate a convergence of </=20cm to demonstrate improve eye coordination and ability to read Baseline: 30cm; 10 cm  Goal status: MET  4.  Patient will score </=30 on the DHI to demonstrate a reduction in symptoms/handicap  Baseline: 38-moderate, 24-moderate Goal status: MET  5. MCTSIB to be assessed and LTG written as appropriate  Baseline: assessed on  3/5  Goal status: MET  6. DVA to be assessed and LTG written as appropriate  Baseline: assessed on 3/5  Goal status: MET    LONG TERM GOALS: Target date: 08/06/23  Pt will be independent with final HEP for improved symptom report  Baseline: to be updated Goal status: INITIAL  Patient will demonstrate a convergence of </=10cm to demonstrate improve eye coordination and ability to read Baseline: 30cm, 10 cm Goal status: IN PROGRESS   Patient will score </=20 on the DHI to demonstrate a reduction in symptoms/handicap  Baseline: 38-moderate; improved to 24  Goal status: IN PROGRESS  Patient will improve mCTSIB score to 98/120 to indicate improved integration of vestibular, proprioceptive, and visual balance systems during static balance tasks in order to reduce risk for falls.   Baseline: 78/120 (13 seconds condition 2, 5 seconds condition 4, full time on condition 1/3) Goal status: INITIAL  Patient will improve line difference on DVA < 3 to indicate functional norms for gaze stabilization.  Baseline: 3 line difference Goal status: INITIAL  ASSESSMENT:  CLINICAL IMPRESSION: Emphasis on skilled physical therapy session on progression to busy background; however, patient response in today's session notably poor with BP drop in diastolic by 20 points over session. Patient was a little stressed with headache pain at start of session so unclear to what extent was  elevated at start versus true BP drop; however, recommend close monitoring given some reports of lightheadedness. Decreased tolerance given headache too. Continue POC as able.  OBJECTIVE IMPAIRMENTS: decreased balance, decreased knowledge of condition, and dizziness.   ACTIVITY LIMITATIONS: bending, standing, bed mobility, bathing, hygiene/grooming, locomotion level, and caring for others  PARTICIPATION LIMITATIONS: meal prep, cleaning, laundry, interpersonal relationship, shopping, community activity, occupation, and yard work  PERSONAL FACTORS: Age, Past/current experiences, Profession, Sex, Time since onset of injury/illness/exacerbation, and 1-2 comorbidities: see above  are also affecting patient's functional outcome.   REHAB POTENTIAL: Fair time since onset  CLINICAL DECISION MAKING: Evolving/moderate complexity  EVALUATION COMPLEXITY: Moderate   PLAN:  PT FREQUENCY: 1-2x/week  PT DURATION: 8 weeks  PLANNED INTERVENTIONS: 97164- PT Re-evaluation, 97110-Therapeutic exercises, 97530- Therapeutic activity, 97112- Neuromuscular re-education, 97535- Self Care, 04540- Manual therapy, [phone]- Gait training, [phone]- Orthotic Fit/training, [phone]- Canalith repositioning, [phone]- Aquatic Therapy, Patient/Family education, Balance training, Stair training, Vestibular training, Visual/preceptual remediation/compensation, Cognitive remediation, and DME instructions  PLAN FOR NEXT SESSION: expand HEP - work on brock string, convergence, how is binasal occlusion going, add balance work from [employer] for corner balance for home, progress balance work with HART chart next session, work on adding pickup tasks for home  Monitor BP  Carmelia Bake, PT, DPT  07/14/2023, 2:02 PM  For all possible CPT codes, reference the Planned Interventions line above.     Check all conditions that are expected to impact treatment: {Conditions expected to impact treatment:None of these apply   If treatment provided at  initial evaluation, no treatment charged due to lack of authorization.

## 2023-07-19 DIAGNOSIS — F4323 Adjustment disorder with mixed anxiety and depressed mood: Secondary | ICD-10-CM | POA: Diagnosis not present

## 2023-07-21 ENCOUNTER — Ambulatory Visit: Admitting: Physical Therapy

## 2023-07-21 ENCOUNTER — Encounter: Payer: Self-pay | Admitting: Physical Therapy

## 2023-07-21 VITALS — BP 101/70 | HR 66

## 2023-07-21 DIAGNOSIS — R2681 Unsteadiness on feet: Secondary | ICD-10-CM

## 2023-07-21 DIAGNOSIS — M5459 Other low back pain: Secondary | ICD-10-CM

## 2023-07-21 DIAGNOSIS — R42 Dizziness and giddiness: Secondary | ICD-10-CM

## 2023-07-21 NOTE — Therapy (Signed)
 OUTPATIENT PHYSICAL THERAPY VESTIBULAR TREATMENT / DISCHARGE  Patient Name: Carolyn Cole MRN: [id number] DOB:19-Aug-1990, 33 y.o., female Today's Date: 07/21/2023  PHYSICAL THERAPY DISCHARGE SUMMARY  Visits from Start of Care: 7  Current functional level related to goals / functional outcomes: Achieved 4/5 LTG   Remaining deficits: Abnormal convergence, and mild dizziness 6% on [facility]   Education / Equipment: When to return to therapy and continued work on HEP   Patient agrees to discharge. Patient goals were partially met. Patient is being discharged due to being pleased with the current functional level.   END OF SESSION:  PT End of Session - 07/21/23 1409     Visit Number 7    Number of Visits 17    Date for PT Re-Evaluation 08/06/23    Authorization Type [place] medicaid prepaid    Progress Note Due on Visit 10    PT Start Time 1408    PT Stop Time 1447    PT Time Calculation (min) 39 min    Equipment Utilized During Treatment Gait belt    Activity Tolerance Patient tolerated treatment well    Behavior During Therapy WFL for tasks assessed/performed             Past Medical History:  Diagnosis Date   Anemia    Hx. of iron deficiency   Anxiety    Concussion 04/13/2010   Fibroids    Heart murmur    As an infant and it resolved   Papilledema, both eyes    Vertigo    Past Surgical History:  Procedure Laterality Date   APPENDECTOMY  2019   MYOMECTOMY N/A 11/11/2022   Procedure: ABDOMINAL MYOMECTOMY;  Surgeon: Lorriane Shire, MD;  Location: [facility] OR;  Service: Gynecology;  Laterality: N/A;   VAGINAL DELIVERY  01/29/2021   Patient Active Problem List   Diagnosis Date Noted   Fibroid 11/11/2022   Intramural and subserous leiomyoma of uterus 11/11/2022   Preeclampsia in postpartum period 02/08/2021   Encounter for induction of labor 01/28/2021   BMI 36.0-36.9,adult 01/27/2021   Fibroids 06/04/2018   Asthma 05/18/2018   Intramural leiomyoma of uterus 05/18/2018    Personal history of MRSA (methicillin resistant Staphylococcus aureus) 05/18/2018    PCP: Ardean Larsen, MD REFERRING PROVIDER: Ardean Larsen, MD  REFERRING DIAG: R42 (ICD-10-CM) - Dizziness and giddiness   THERAPY DIAG:  Unsteadiness on feet  Other low back pain  Dizziness and giddiness  ONSET DATE: 06/03/23 referral  Rationale for Evaluation and Treatment: Rehabilitation  SUBJECTIVE:   SUBJECTIVE STATEMENT: Patient reports that after last session she had a migraine for 3 days. She reports that she does have a migraine medication but didn't take it as was afraid of the side effects. Patient reports that she was running late due to a webinar. Patient reports that overall she is feeling better and the dizziness hasn't an issue. Patient reports that her dizziness has been way better and feels ready discharge.    Pt accompanied by: self  PERTINENT HISTORY: vertigo, anxiety, concussion, papilledema, posteclampsia   PAIN:  Are you having pain?  4/10 - headache  PRECAUTIONS: Fall  WEIGHT BEARING RESTRICTIONS: No  FALLS: Has patient fallen in last 6 months? No  LIVING ENVIRONMENT: Lives with: lives with their family Lives in: House/apartment Stairs: Yes: External: 2 steps; none Has following equipment at home: None  PLOF: Independent; MHT (in person and telehealth)   PATIENT GOALS: "something to manage it"  OBJECTIVE:  Note: Objective measures were completed at  Evaluation unless otherwise noted.  DIAGNOSTIC FINDINGS: 04/01/21 Brain MRI: IMPRESSION:    Normal MRI brain (with and without).   COGNITION: Overall cognitive status: Within functional limits for tasks assessed                                                                                              TREATMENT      07/21/2023    2:46 PM 07/21/2023    2:25 PM 07/21/2023    2:24 PM  Vitals with BMI  Systolic 101 97 96  Diastolic 70 65 67  Pulse 66 81 70       Standing   Seated   Supine   Self  Care: Assessed vitals as noted above, performed assessment for orthostatic hypotension given BP drop last session; BP readings on lower end of normal denies feeling lightheaded, recommend continued monitoring and following up with PCP for management as needed  Discussed migraine medication and encouraged patient to speak with doctor about medical management, also provided migraine handout that includes logs, diet modifications, and other resources patient can look into as needed   TherAct:  Assessed Final Goals as noted below: Dizziness Handicap Inventory: 6/100 = 6% impairment Convergence: ~ 20 centimeters (repeated 3x) Dynamic Visual Acuity Static: 11 Dynamic: 10 Results: 1 line difference, WNL   M-CTSIB  Condition 1: Firm Surface, EO 30 Sec, Normal Sway  Condition 2: Firm Surface, EC 30 Sec, Normal Sway  Condition 3: Foam Surface, EO 30 Sec, Normal Sway  Condition 4: Foam Surface, EC 30 Sec, Min Sway     NMR: Standing on rocker board reaching and grabbing cards on counter with looking side to side (patient wanted to try) Performed x 24 cards pulling down without symptoms  Performed x 24 cards returning back up with reports of only mild symptoms  PATIENT EDUCATION: Education details: Follow up with PCP + continue HEP if asymptomatic for lightheadedness  Person educated: Patient Education method: Explanation, Demonstration, and Handouts Education comprehension: verbalized understanding and needs further education  HOME EXERCISE PROGRAM: Access Code: [email or web address] URL: [email or web address] Date: 06/09/2023 Prepared by: Merry Lofty  Exercises - Seated Gaze Stabilization with Head Rotation  - 1 x daily - 7 x weekly - 3 sets - 30s hold - Seated VOR Cancellation  - 1 x daily - 7 x weekly - 3 sets - 30s hold  Additions: - Pencil pushups ~ 10-20 reps daily with breaks in between as needed - Brock string ~ 10 time a day with breaks as needed  - HART chart 2-3 rounds  a day  Chocolate popsicle stick exercise  Reading up and down stick bringing it close to nose and back 3 reps a day Reading up and down stick with slow head turns keeping eyes fixed on letters 3 reps a day  GOALS: Goals reviewed with patient? Yes  SHORT TERM GOALS: Target date: 07/09/23  Pt will be independent with initial HEP for improved symptom report  Baseline: to be updated, reports independence with exercises so far Goal status: MET  2.  Pt will report </= 1/5 for all movements on MSQ  to indicate improvement in motion sensitivity and improved activity tolerance.   Baseline: up to 3/5, worse is 2/5 only on horizontal head turns Goal status: NOT MET but progressing   3.  Patient will demonstrate a convergence of </=20cm to demonstrate improve eye coordination and ability to read Baseline: 30cm; 10 cm  Goal status: MET  4.  Patient will score </=30 on the DHI to demonstrate a reduction in symptoms/handicap  Baseline: 38-moderate, 24-moderate Goal status: MET  5. MCTSIB to be assessed and LTG written as appropriate  Baseline: assessed on 3/5  Goal status: MET  6. DVA to be assessed and LTG written as appropriate  Baseline: assessed on 3/5  Goal status: MET    LONG TERM GOALS: Target date: 08/06/23  Pt will be independent with final HEP for improved symptom report  Baseline: to be updated, reports feeling confident in home program Goal status:  MET  Patient will demonstrate a convergence of </=10cm to demonstrate improve eye coordination and ability to read Baseline: 30cm, 10 cm, 20 cm Goal status: NOT MET  Patient will score </=20 on the DHI to demonstrate a reduction in symptoms/handicap  Baseline: 38-moderate; improved to 24, 6% impairment Goal status: MET  Patient will improve mCTSIB score to 98/120 to indicate improved integration of vestibular, proprioceptive, and visual balance systems during static balance tasks in order to reduce risk for falls.    Baseline: 78/120 (13 seconds condition 2, 5 seconds condition 4, full time on condition 1/3); improved to 120/129 Goal status: MET  Patient will improve line difference on DVA < 3 to indicate functional norms for gaze stabilization.  Baseline: 3 line difference, 1 line difference Goal status: MET  ASSESSMENT:  CLINICAL IMPRESSION: Patient is being discharged from skilled physical therapy due to satisfaction with progress in therapy. Patient achieved 4/5 LTG with convergence being major ongoing deficit. Patient reports symptoms are well managed in everyday life and feels independent in home program.   OBJECTIVE IMPAIRMENTS: decreased balance, decreased knowledge of condition, and dizziness.   ACTIVITY LIMITATIONS: bending, standing, bed mobility, bathing, hygiene/grooming, locomotion level, and caring for others  PARTICIPATION LIMITATIONS: meal prep, cleaning, laundry, interpersonal relationship, shopping, community activity, occupation, and yard work  PERSONAL FACTORS: Age, Past/current experiences, Profession, Sex, Time since onset of injury/illness/exacerbation, and 1-2 comorbidities: see above  are also affecting patient's functional outcome.   REHAB POTENTIAL: Fair time since onset  CLINICAL DECISION MAKING: Evolving/moderate complexity  EVALUATION COMPLEXITY: Moderate   PLAN:  PT FREQUENCY: 1-2x/week  PT DURATION: 8 weeks  PLANNED INTERVENTIONS: 97164- PT Re-evaluation, 97110-Therapeutic exercises, 97530- Therapeutic activity, 97112- Neuromuscular re-education, 97535- Self Care, 19147- Manual therapy, [phone]- Gait training, [phone]- Orthotic Fit/training, [phone]- Canalith repositioning, [phone]- Aquatic Therapy, Patient/Family education, Balance training, Stair training, Vestibular training, Visual/preceptual remediation/compensation, Cognitive remediation, and DME instructions  PLAN FOR NEXT SESSION: NA - D/C with updated HEP   Monitor BP  Carmelia Bake, PT, DPT  07/21/2023,  3:02 PM  For all possible CPT codes, reference the Planned Interventions line above.     Check all conditions that are expected to impact treatment: {Conditions expected to impact treatment:None of these apply   If treatment provided at initial evaluation, no treatment charged due to lack of authorization.

## 2023-07-28 ENCOUNTER — Encounter: Payer: Medicaid Other | Admitting: Physical Therapy

## 2023-07-29 DIAGNOSIS — I959 Hypotension, unspecified: Secondary | ICD-10-CM | POA: Diagnosis not present

## 2023-07-30 DIAGNOSIS — F4323 Adjustment disorder with mixed anxiety and depressed mood: Secondary | ICD-10-CM | POA: Diagnosis not present

## 2023-08-04 ENCOUNTER — Encounter: Payer: Medicaid Other | Admitting: Physical Therapy

## 2023-08-23 DIAGNOSIS — F4323 Adjustment disorder with mixed anxiety and depressed mood: Secondary | ICD-10-CM | POA: Diagnosis not present

## 2023-08-31 ENCOUNTER — Ambulatory Visit

## 2023-08-31 DIAGNOSIS — Z3201 Encounter for pregnancy test, result positive: Secondary | ICD-10-CM

## 2023-08-31 LAB — POCT PREGNANCY, URINE: Preg Test, Ur: POSITIVE — AB

## 2023-08-31 NOTE — Progress Notes (Signed)
 Telephone visit to inform patient of positive urine pregnancy test today. Pt verified her identity by confirming her name and DOB. Pt reports her LMP was 07/30/23 making her EDD 05/05/24 and 4 weeks 4 days today.  Pt denies pain or vaginal bleeding.  Pt reports she is currently taking PNV.  List of [place] area [employer] and safe medications to take during pregnancy provided in pt MyChart.  Advised pt to go to MAU at [facility] if she experiences unrelieved pain or vaginal bleeding to be assessed and to initiate [facility] around 10 weeks at location of her choosing.  Pt verbalized understanding.  Carolynne Citron, RN

## 2023-08-31 NOTE — Patient Instructions (Signed)

## 2023-09-16 DIAGNOSIS — Z3687 Encounter for antenatal screening for uncertain dates: Secondary | ICD-10-CM | POA: Diagnosis not present

## 2023-09-16 DIAGNOSIS — Z3480 Encounter for supervision of other normal pregnancy, unspecified trimester: Secondary | ICD-10-CM | POA: Diagnosis not present

## 2023-09-20 DIAGNOSIS — F4323 Adjustment disorder with mixed anxiety and depressed mood: Secondary | ICD-10-CM | POA: Diagnosis not present

## 2023-09-28 DIAGNOSIS — F4323 Adjustment disorder with mixed anxiety and depressed mood: Secondary | ICD-10-CM | POA: Diagnosis not present

## 2023-10-12 DIAGNOSIS — F4323 Adjustment disorder with mixed anxiety and depressed mood: Secondary | ICD-10-CM | POA: Diagnosis not present

## 2023-10-13 ENCOUNTER — Other Ambulatory Visit: Payer: Self-pay | Admitting: Obstetrics and Gynecology

## 2023-10-13 ENCOUNTER — Other Ambulatory Visit (HOSPITAL_COMMUNITY)
Admission: RE | Admit: 2023-10-13 | Discharge: 2023-10-13 | Disposition: A | Discharge status: Home / Self Care | Source: Ambulatory Visit | Attending: Obstetrics and Gynecology | Admitting: Obstetrics and Gynecology

## 2023-10-13 DIAGNOSIS — Z01419 Encounter for gynecological examination (general) (routine) without abnormal findings: Secondary | ICD-10-CM | POA: Diagnosis present

## 2023-10-13 DIAGNOSIS — Z3482 Encounter for supervision of other normal pregnancy, second trimester: Secondary | ICD-10-CM | POA: Diagnosis not present

## 2023-10-13 DIAGNOSIS — Z3481 Encounter for supervision of other normal pregnancy, first trimester: Secondary | ICD-10-CM | POA: Diagnosis not present

## 2023-10-13 DIAGNOSIS — Z3A26 26 weeks gestation of pregnancy: Secondary | ICD-10-CM | POA: Diagnosis not present

## 2023-10-13 DIAGNOSIS — Z3490 Encounter for supervision of normal pregnancy, unspecified, unspecified trimester: Secondary | ICD-10-CM | POA: Diagnosis not present

## 2023-10-13 DIAGNOSIS — Z9889 Other specified postprocedural states: Secondary | ICD-10-CM | POA: Diagnosis not present

## 2023-10-22 LAB — CYTOLOGY - PAP
Comment: NEGATIVE
Diagnosis: NEGATIVE
High risk HPV: NEGATIVE

## 2023-10-29 DIAGNOSIS — F4323 Adjustment disorder with mixed anxiety and depressed mood: Secondary | ICD-10-CM | POA: Diagnosis not present

## 2023-11-16 DIAGNOSIS — F4323 Adjustment disorder with mixed anxiety and depressed mood: Secondary | ICD-10-CM | POA: Diagnosis not present

## 2023-11-30 DIAGNOSIS — F4323 Adjustment disorder with mixed anxiety and depressed mood: Secondary | ICD-10-CM | POA: Diagnosis not present

## 2023-12-01 NOTE — Progress Notes (Unsigned)
 No chief complaint on file.  History of Present Illness: 33 yo female with history of anxiety who is here today as a new consult, referred by Dr. ***, for hypotension.   Primary Care Physician: Vernon Velna SAUNDERS, MD   Past Medical History:  Diagnosis Date   Anemia    Hx. of iron deficiency   Anxiety    Concussion 04/13/2010   Fibroids    Heart murmur    As an infant and it resolved   Papilledema, both eyes    Vertigo     Past Surgical History:  Procedure Laterality Date   APPENDECTOMY  2019   MYOMECTOMY N/A 11/11/2022   Procedure: ABDOMINAL MYOMECTOMY;  Surgeon: Jeralyn Crutch, MD;  Location: [facility] OR;  Service: Gynecology;  Laterality: N/A;   VAGINAL DELIVERY  01/29/2021    Current Outpatient Medications  Medication Sig Dispense Refill   acetaminophen  (TYLENOL ) 500 MG tablet Take 2 tablets (1,000 mg total) by mouth every 6 (six) hours. 120 tablet 2   oxyCODONE  (OXY IR/ROXICODONE ) 5 MG immediate release tablet Take 1 tablet (5 mg total) by mouth every 4 (four) hours as needed for severe pain. (Patient not taking: Reported on 12/03/2022) 25 tablet 0   polyethylene glycol (MIRALAX  / GLYCOLAX ) 17 g packet Take 17 g by mouth daily as needed for mild constipation. (Patient not taking: Reported on 12/03/2022) 14 each 2   Prenatal MV-Min-Fe Fum-FA-DHA (PRENATAL 1 PO) Take 1 tablet by mouth daily.     simethicone  (MYLICON) 80 MG chewable tablet Chew 1 tablet (80 mg total) by mouth 4 (four) times daily as needed for flatulence. (Patient not taking: Reported on 12/03/2022) 30 tablet 0   tranexamic acid  (LYSTEDA ) 650 MG TABS tablet Take 2 tablets (1,300 mg total) by mouth 3 (three) times daily. Take during menses for a maximum of five days 30 tablet 2   No current facility-administered medications for this visit.    Allergies  Allergen Reactions   Oxycodone  Hcl Other (See Comments)    Social History   Socioeconomic History   Marital status: Single    Spouse name: Not on file    Number of children: 0   Years of education: Not on file   Highest education level: Master's degree (e.g., MA, MS, MEng, MEd, MSW, MBA)  Occupational History   Not on file  Tobacco Use   Smoking status: Never   Smokeless tobacco: Never  Vaping Use   Vaping status: Never Used  Substance and Sexual Activity   Alcohol use: Yes    Comment: rarely   Drug use: Never   Sexual activity: Yes  Other Topics Concern   Not on file  Social History Narrative   Caffeine- green tea   Social Drivers of Corporate investment banker Strain: Not on file  Food Insecurity: Not on file  Transportation Needs: Not on file  Physical Activity: Not on file  Stress: Not on file  Social Connections: Not on file  Intimate Partner Violence: Not on file    Family History  Problem Relation Age of Onset   Thyroid  disease Mother    Hypertension Mother    Hypertension Father    Ulcers Father    Gout Paternal Grandmother    Chiari malformation Sister     Review of Systems:  As stated in the HPI and otherwise negative.   LMP 07/30/2023 (Exact Date)   Physical Examination: General: Well developed, well nourished, NAD  HEENT: OP clear, mucus membranes  moist  SKIN: warm, dry. No rashes. Neuro: No focal deficits  Musculoskeletal: Muscle strength 5/5 all ext  Psychiatric: Mood and affect normal  Neck: No JVD, no carotid bruits, no thyromegaly, no lymphadenopathy.  Lungs:Clear bilaterally, no wheezes, rhonci, crackles Cardiovascular: Regular rate and rhythm. No murmurs, gallops or rubs. Abdomen:Soft. Bowel sounds present. Non-tender.  Extremities: No lower extremity edema. Pulses are 2 + in the bilateral DP/PT.  EKG:  EKG {ACTION; IS/IS WNU:[id number]} ordered today. The ekg ordered today demonstrates ***  Recent Labs: No results found for requested labs within last 365 days.   Lipid Panel No results found for: CHOL, TRIG, HDL, CHOLHDL, VLDL, LDLCALC, LDLDIRECT   Wt Readings from Last  3 Encounters:  01/05/23 223 lb 9.6 oz (101.4 kg)  12/03/22 222 lb 3.2 oz (100.8 kg)  11/11/22 226 lb (102.5 kg)      Assessment and Plan:   1.   Labs/ tests ordered today include:  No orders of the defined types were placed in this encounter.    Disposition:   F/U with me in ***    Signed, Lonni Cash, MD, [facility] 12/01/2023 12:53 PM    [facility] [address] [place], [place], [place]  [address] Phone: [phone]; Fax: [phone]

## 2023-12-02 ENCOUNTER — Ambulatory Visit: Attending: Cardiovascular Disease | Admitting: Cardiovascular Disease

## 2023-12-02 ENCOUNTER — Encounter: Payer: Self-pay | Admitting: Cardiovascular Disease

## 2023-12-02 VITALS — BP 124/76 | HR 100 | Ht 64.0 in | Wt 229.0 lb

## 2023-12-02 DIAGNOSIS — R42 Dizziness and giddiness: Secondary | ICD-10-CM | POA: Insufficient documentation

## 2023-12-02 NOTE — Patient Instructions (Signed)
Medication Instructions:  No changes *If you need a refill on your cardiac medications before your next appointment, please call your pharmacy*   Lab Work: none   Testing/Procedures: none   Follow-Up: As needed   

## 2023-12-29 DIAGNOSIS — F4323 Adjustment disorder with mixed anxiety and depressed mood: Secondary | ICD-10-CM | POA: Diagnosis not present

## 2024-01-18 DIAGNOSIS — F4323 Adjustment disorder with mixed anxiety and depressed mood: Secondary | ICD-10-CM | POA: Diagnosis not present

## 2024-01-18 LAB — OB RESULTS CONSOLE HIV ANTIBODY (ROUTINE TESTING): HIV: NONREACTIVE

## 2024-01-18 LAB — OB RESULTS CONSOLE RPR: RPR: NONREACTIVE

## 2024-01-18 LAB — OB RESULTS CONSOLE HEPATITIS B SURFACE ANTIGEN: Hepatitis B Surface Ag: NEGATIVE

## 2024-01-18 LAB — OB RESULTS CONSOLE RUBELLA ANTIBODY, IGM: Rubella: IMMUNE

## 2024-01-18 LAB — OB RESULTS CONSOLE ANTIBODY SCREEN: Antibody Screen: NEGATIVE

## 2024-01-18 LAB — OB RESULTS CONSOLE GC/CHLAMYDIA
Chlamydia: NEGATIVE
Neisseria Gonorrhea: NEGATIVE

## 2024-01-18 LAB — HEPATITIS C ANTIBODY: HCV Ab: NEGATIVE

## 2024-01-28 DIAGNOSIS — Z3482 Encounter for supervision of other normal pregnancy, second trimester: Secondary | ICD-10-CM | POA: Diagnosis not present

## 2024-01-28 DIAGNOSIS — Z3490 Encounter for supervision of normal pregnancy, unspecified, unspecified trimester: Secondary | ICD-10-CM | POA: Diagnosis not present

## 2024-02-03 DIAGNOSIS — Z3480 Encounter for supervision of other normal pregnancy, unspecified trimester: Secondary | ICD-10-CM | POA: Diagnosis not present

## 2024-02-07 DIAGNOSIS — F4323 Adjustment disorder with mixed anxiety and depressed mood: Secondary | ICD-10-CM | POA: Diagnosis not present

## 2024-02-07 DIAGNOSIS — Z0000 Encounter for general adult medical examination without abnormal findings: Secondary | ICD-10-CM | POA: Diagnosis not present

## 2024-02-07 DIAGNOSIS — E7800 Pure hypercholesterolemia, unspecified: Secondary | ICD-10-CM | POA: Diagnosis not present

## 2024-02-07 DIAGNOSIS — G4719 Other hypersomnia: Secondary | ICD-10-CM | POA: Diagnosis not present

## 2024-02-07 DIAGNOSIS — F419 Anxiety disorder, unspecified: Secondary | ICD-10-CM | POA: Diagnosis not present

## 2024-02-14 DIAGNOSIS — O99212 Obesity complicating pregnancy, second trimester: Secondary | ICD-10-CM | POA: Diagnosis not present

## 2024-02-14 DIAGNOSIS — Z23 Encounter for immunization: Secondary | ICD-10-CM | POA: Diagnosis not present

## 2024-02-21 DIAGNOSIS — F4323 Adjustment disorder with mixed anxiety and depressed mood: Secondary | ICD-10-CM | POA: Diagnosis not present

## 2024-03-04 ENCOUNTER — Inpatient Hospital Stay (HOSPITAL_COMMUNITY)
Admission: AD | Admit: 2024-03-04 | Discharge: 2024-03-04 | Disposition: A | Discharge status: Home / Self Care | Attending: Obstetrics & Gynecology | Admitting: Obstetrics & Gynecology

## 2024-03-04 ENCOUNTER — Other Ambulatory Visit: Payer: Self-pay

## 2024-03-04 ENCOUNTER — Encounter (HOSPITAL_COMMUNITY): Payer: Self-pay

## 2024-03-04 DIAGNOSIS — M542 Cervicalgia: Secondary | ICD-10-CM | POA: Insufficient documentation

## 2024-03-04 DIAGNOSIS — Z3689 Encounter for other specified antenatal screening: Secondary | ICD-10-CM | POA: Insufficient documentation

## 2024-03-04 DIAGNOSIS — Z3A31 31 weeks gestation of pregnancy: Secondary | ICD-10-CM | POA: Insufficient documentation

## 2024-03-04 DIAGNOSIS — M545 Low back pain, unspecified: Secondary | ICD-10-CM | POA: Insufficient documentation

## 2024-03-04 DIAGNOSIS — Y92410 Unspecified street and highway as the place of occurrence of the external cause: Secondary | ICD-10-CM | POA: Insufficient documentation

## 2024-03-04 DIAGNOSIS — O9A213 Injury, poisoning and certain other consequences of external causes complicating pregnancy, third trimester: Secondary | ICD-10-CM | POA: Insufficient documentation

## 2024-03-04 LAB — URINALYSIS, ROUTINE W REFLEX MICROSCOPIC
Bilirubin Urine: NEGATIVE
Glucose, UA: NEGATIVE mg/dL
Ketones, ur: NEGATIVE mg/dL
Leukocytes,Ua: NEGATIVE
Nitrite: NEGATIVE
Protein, ur: NEGATIVE mg/dL
Specific Gravity, Urine: 1.017 (ref 1.005–1.030)
pH: 6 (ref 5.0–8.0)

## 2024-03-04 LAB — KLEIHAUER-BETKE STAIN
Fetal Cells %: 0 %
Quantitation Fetal Hemoglobin: 0 mL

## 2024-03-04 MED ORDER — ACETAMINOPHEN 500 MG PO TABS
1000.0000 mg | ORAL_TABLET | Freq: Once | ORAL | Status: AC
  Administered 2024-03-04: 1000 mg via ORAL
  Filled 2024-03-04: qty 2

## 2024-03-04 MED ORDER — ACETAMINOPHEN 325 MG PO TABS: 650.0000 mg | ORAL_TABLET | Freq: Four times a day (QID) | ORAL | 0 refills | Status: DC | PRN

## 2024-03-04 MED ORDER — CYCLOBENZAPRINE HCL 5 MG PO TABS
10.0000 mg | ORAL_TABLET | Freq: Once | ORAL | Status: AC
  Administered 2024-03-04: 10 mg via ORAL
  Filled 2024-03-04: qty 2

## 2024-03-04 MED ORDER — CYCLOBENZAPRINE HCL 10 MG PO TABS: 10.0000 mg | ORAL_TABLET | Freq: Two times a day (BID) | ORAL | 0 refills | Status: DC | PRN
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
## 2024-03-14 DIAGNOSIS — Z3A32 32 weeks gestation of pregnancy: Secondary | ICD-10-CM | POA: Diagnosis not present

## 2024-03-14 DIAGNOSIS — O99213 Obesity complicating pregnancy, third trimester: Secondary | ICD-10-CM | POA: Diagnosis not present

## 2024-03-14 DIAGNOSIS — Z6835 Body mass index (BMI) 35.0-35.9, adult: Secondary | ICD-10-CM | POA: Diagnosis not present

## 2024-03-29 DIAGNOSIS — F4323 Adjustment disorder with mixed anxiety and depressed mood: Secondary | ICD-10-CM | POA: Diagnosis not present

## 2024-03-31 ENCOUNTER — Encounter (HOSPITAL_COMMUNITY): Payer: Self-pay

## 2024-03-31 NOTE — Patient Instructions (Signed)
"   Gaylene Moylan  03/31/2024   Your procedure is scheduled on:  04/14/2024  Arrive at 1000 at Entrance C on [address] at [facility]  and [employer]. You are invited to use the FREE valet parking or use the Visitor's parking deck.  Pick up the phone at the desk and dial [phone].  Call this number if you have problems the morning of surgery: [phone]  Remember:   Do not eat food:(After Midnight) Desps de medianoche.  You may drink clear liquids until  _0800____.  Clear liquids means a liquid you can see thru.  It can have color such as Cola or Kool aid.  Tea is OK and coffee as long as no milk or creamer of any kind.  Take these medicines the morning of surgery with A SIP OF WATER:  none   Do not wear jewelry, make-up or nail polish.  Do not wear lotions, powders, or perfumes. Do not wear deodorant.  Do not shave 48 hours prior to surgery.  Do not bring valuables to the hospital.  [facility] is not   responsible for any belongings or valuables brought to the hospital.  Contacts, dentures or bridgework may not be worn into surgery.  Leave suitcase in the car. After surgery it may be brought to your room.  For patients admitted to the hospital, checkout time is 11:00 AM the day of              discharge.      Please read over the following fact sheets that you were given:     Preparing for Surgery   "

## 2024-04-12 ENCOUNTER — Encounter (HOSPITAL_COMMUNITY)
Admission: RE | Admit: 2024-04-12 | Discharge: 2024-04-12 | Disposition: A | Discharge status: Home / Self Care | Source: Ambulatory Visit | Attending: Obstetrics and Gynecology | Admitting: Obstetrics and Gynecology

## 2024-04-12 DIAGNOSIS — Z01812 Encounter for preprocedural laboratory examination: Secondary | ICD-10-CM | POA: Insufficient documentation

## 2024-04-12 HISTORY — DX: Benign neoplasm of connective and other soft tissue, unspecified: D21.9

## 2024-04-12 HISTORY — DX: Personal history of other diseases of the circulatory system: Z86.79

## 2024-04-12 LAB — CBC
HCT: 33.3 % — ABNORMAL LOW (ref 36.0–46.0)
Hemoglobin: 11.6 g/dL — ABNORMAL LOW (ref 12.0–15.0)
MCH: 33.1 pg (ref 26.0–34.0)
MCHC: 34.8 g/dL (ref 30.0–36.0)
MCV: 95.1 fL (ref 80.0–100.0)
Platelets: 228 K/uL (ref 150–400)
RBC: 3.5 MIL/uL — ABNORMAL LOW (ref 3.87–5.11)
RDW: 14.4 % (ref 11.5–15.5)
WBC: 8.6 K/uL (ref 4.0–10.5)
nRBC: 0 % (ref 0.0–0.2)

## 2024-04-12 LAB — TYPE AND SCREEN
ABO/RH(D): O POS
Antibody Screen: NEGATIVE

## 2024-04-12 LAB — SYPHILIS: RPR W/REFLEX TO RPR TITER AND TREPONEMAL ANTIBODIES, TRADITIONAL SCREENING AND DIAGNOSIS ALGORITHM: RPR Ser Ql: NONREACTIVE

## 2024-04-14 ENCOUNTER — Inpatient Hospital Stay (HOSPITAL_COMMUNITY)
Admission: RE | Admit: 2024-04-14 | Discharge: 2024-04-16 | DRG: 788 | Disposition: A | Discharge status: Home / Self Care | Attending: Obstetrics and Gynecology | Admitting: Obstetrics and Gynecology

## 2024-04-14 ENCOUNTER — Inpatient Hospital Stay (HOSPITAL_COMMUNITY): Admitting: Anesthesiology

## 2024-04-14 ENCOUNTER — Encounter (HOSPITAL_COMMUNITY): Payer: Self-pay | Admitting: Obstetrics and Gynecology

## 2024-04-14 ENCOUNTER — Other Ambulatory Visit: Payer: Self-pay

## 2024-04-14 ENCOUNTER — Encounter (HOSPITAL_COMMUNITY): Admission: RE | Disposition: A | Payer: Self-pay | Source: Home / Self Care | Attending: Obstetrics and Gynecology

## 2024-04-14 DIAGNOSIS — O99892 Other specified diseases and conditions complicating childbirth: Secondary | ICD-10-CM | POA: Diagnosis present

## 2024-04-14 DIAGNOSIS — O3429 Maternal care due to uterine scar from other previous surgery: Secondary | ICD-10-CM | POA: Diagnosis present

## 2024-04-14 DIAGNOSIS — O134 Gestational [pregnancy-induced] hypertension without significant proteinuria, complicating childbirth: Secondary | ICD-10-CM | POA: Diagnosis present

## 2024-04-14 DIAGNOSIS — O9902 Anemia complicating childbirth: Secondary | ICD-10-CM | POA: Diagnosis present

## 2024-04-14 DIAGNOSIS — Z8249 Family history of ischemic heart disease and other diseases of the circulatory system: Secondary | ICD-10-CM | POA: Diagnosis not present

## 2024-04-14 DIAGNOSIS — Z3A37 37 weeks gestation of pregnancy: Secondary | ICD-10-CM

## 2024-04-14 DIAGNOSIS — M419 Scoliosis, unspecified: Secondary | ICD-10-CM | POA: Diagnosis present

## 2024-04-14 DIAGNOSIS — O321XX0 Maternal care for breech presentation, not applicable or unspecified: Secondary | ICD-10-CM

## 2024-04-14 DIAGNOSIS — Z9889 Other specified postprocedural states: Principal | ICD-10-CM

## 2024-04-14 LAB — CBC
HCT: 32.8 % — ABNORMAL LOW (ref 36.0–46.0)
Hemoglobin: 11.1 g/dL — ABNORMAL LOW (ref 12.0–15.0)
MCH: 31.9 pg (ref 26.0–34.0)
MCHC: 33.8 g/dL (ref 30.0–36.0)
MCV: 94.3 fL (ref 80.0–100.0)
Platelets: 182 K/uL (ref 150–400)
RBC: 3.48 MIL/uL — ABNORMAL LOW (ref 3.87–5.11)
RDW: 14.3 % (ref 11.5–15.5)
WBC: 8.3 K/uL (ref 4.0–10.5)
nRBC: 0 % (ref 0.0–0.2)

## 2024-04-14 LAB — CREATININE, SERUM
Creatinine, Ser: 0.56 mg/dL (ref 0.44–1.00)
GFR, Estimated: >60 mL/min

## 2024-04-14 SURGERY — Surgical Case
Anesthesia: Spinal

## 2024-04-14 MED ORDER — TRANEXAMIC ACID-NACL 1000-0.7 MG/100ML-% IV SOLN
INTRAVENOUS | Status: AC
  Filled 2024-04-14: qty 100

## 2024-04-14 MED ORDER — FENTANYL CITRATE (PF) 100 MCG/2ML IJ SOLN: 25.0000 ug | INTRAMUSCULAR | Status: DC | PRN

## 2024-04-14 MED ORDER — WITCH HAZEL-GLYCERIN EX PADS: 1.0000 | MEDICATED_PAD | CUTANEOUS | Status: DC | PRN

## 2024-04-14 MED ORDER — MORPHINE SULFATE (PF) 0.5 MG/ML IJ SOLN
INTRAMUSCULAR | Status: DC | PRN
  Administered 2024-04-14: 150 ug via INTRATHECAL

## 2024-04-14 MED ORDER — OXYCODONE HCL 5 MG PO TABS
5.0000 mg | ORAL_TABLET | ORAL | Status: DC | PRN
  Administered 2024-04-15 – 2024-04-16 (×4): 5 mg via ORAL
  Filled 2024-04-14 (×4): qty 1

## 2024-04-14 MED ORDER — LACTATED RINGERS IV SOLN: INTRAVENOUS | Status: DC

## 2024-04-14 MED ORDER — KETOROLAC TROMETHAMINE 30 MG/ML IJ SOLN
30.0000 mg | Freq: Four times a day (QID) | INTRAMUSCULAR | Status: AC
  Administered 2024-04-14 – 2024-04-15 (×4): 30 mg via INTRAVENOUS
  Filled 2024-04-14 (×4): qty 1

## 2024-04-14 MED ORDER — FENTANYL CITRATE (PF) 100 MCG/2ML IJ SOLN
INTRAMUSCULAR | Status: DC | PRN
  Administered 2024-04-14: 15 ug via INTRATHECAL

## 2024-04-14 MED ORDER — KETOROLAC TROMETHAMINE 30 MG/ML IJ SOLN: 30.0000 mg | Freq: Four times a day (QID) | INTRAMUSCULAR | Status: AC | PRN

## 2024-04-14 MED ORDER — SOD CITRATE-CITRIC ACID 500-334 MG/5ML PO SOLN
30.0000 mL | ORAL | Status: AC
  Administered 2024-04-14: 30 mL via ORAL

## 2024-04-14 MED ORDER — SIMETHICONE 80 MG PO CHEW
80.0000 mg | CHEWABLE_TABLET | Freq: Three times a day (TID) | ORAL | Status: DC
  Administered 2024-04-14 – 2024-04-16 (×5): 80 mg via ORAL
  Filled 2024-04-14 (×5): qty 1

## 2024-04-14 MED ORDER — DIBUCAINE (PERIANAL) 1 % EX OINT: 1.0000 | TOPICAL_OINTMENT | CUTANEOUS | Status: DC | PRN

## 2024-04-14 MED ORDER — ZOLPIDEM TARTRATE 5 MG PO TABS: 5.0000 mg | ORAL_TABLET | Freq: Every evening | ORAL | Status: DC | PRN

## 2024-04-14 MED ORDER — OXYCODONE HCL 5 MG/5ML PO SOLN: 5.0000 mg | Freq: Once | ORAL | Status: DC | PRN

## 2024-04-14 MED ORDER — NALOXONE HCL 0.4 MG/ML IJ SOLN: 0.4000 mg | INTRAMUSCULAR | Status: DC | PRN

## 2024-04-14 MED ORDER — GABAPENTIN 100 MG PO CAPS
100.0000 mg | ORAL_CAPSULE | Freq: Every day | ORAL | Status: DC
  Administered 2024-04-14 – 2024-04-15 (×2): 100 mg via ORAL
  Filled 2024-04-14 (×2): qty 1

## 2024-04-14 MED ORDER — TRANEXAMIC ACID-NACL 1000-0.7 MG/100ML-% IV SOLN
1000.0000 mg | Freq: Once | INTRAVENOUS | Status: AC
  Administered 2024-04-14: 1000 mg via INTRAVENOUS

## 2024-04-14 MED ORDER — MENTHOL 3 MG MT LOZG: 1.0000 | LOZENGE | OROMUCOSAL | Status: DC | PRN

## 2024-04-14 MED ORDER — SODIUM CHLORIDE 0.9% FLUSH: 3.0000 mL | INTRAVENOUS | Status: DC | PRN

## 2024-04-14 MED ORDER — OXYTOCIN-SODIUM CHLORIDE 30-0.9 UT/500ML-% IV SOLN: 2.5000 [IU]/h | INTRAVENOUS | Status: AC

## 2024-04-14 MED ORDER — OXYTOCIN-SODIUM CHLORIDE 30-0.9 UT/500ML-% IV SOLN
INTRAVENOUS | Status: AC
  Filled 2024-04-14: qty 500

## 2024-04-14 MED ORDER — OXYTOCIN-SODIUM CHLORIDE 30-0.9 UT/500ML-% IV SOLN
INTRAVENOUS | Status: DC | PRN
  Administered 2024-04-14: 300 mL via INTRAVENOUS

## 2024-04-14 MED ORDER — ACETAMINOPHEN 500 MG PO TABS
1000.0000 mg | ORAL_TABLET | Freq: Four times a day (QID) | ORAL | Status: DC
  Administered 2024-04-14 – 2024-04-16 (×7): 1000 mg via ORAL
  Filled 2024-04-14 (×8): qty 2

## 2024-04-14 MED ORDER — SOD CITRATE-CITRIC ACID 500-334 MG/5ML PO SOLN
ORAL | Status: AC
  Filled 2024-04-14: qty 30

## 2024-04-14 MED ORDER — ONDANSETRON HCL 4 MG/2ML IJ SOLN
4.0000 mg | Freq: Three times a day (TID) | INTRAMUSCULAR | Status: DC | PRN
  Administered 2024-04-16: 4 mg via INTRAVENOUS
  Filled 2024-04-14: qty 2

## 2024-04-14 MED ORDER — PHENYLEPHRINE HCL-NACL 20-0.9 MG/250ML-% IV SOLN
INTRAVENOUS | Status: DC | PRN
  Administered 2024-04-14: 60 ug/min via INTRAVENOUS

## 2024-04-14 MED ORDER — ENOXAPARIN SODIUM 40 MG/0.4ML IJ SOSY
40.0000 mg | PREFILLED_SYRINGE | INTRAMUSCULAR | Status: DC
  Administered 2024-04-15 – 2024-04-16 (×2): 40 mg via SUBCUTANEOUS
  Filled 2024-04-14 (×2): qty 0.4

## 2024-04-14 MED ORDER — CEFAZOLIN SODIUM-DEXTROSE 2-4 GM/100ML-% IV SOLN
2.0000 g | INTRAVENOUS | Status: AC
  Administered 2024-04-14: 2 g via INTRAVENOUS

## 2024-04-14 MED ORDER — DIPHENHYDRAMINE HCL 25 MG PO CAPS: 25.0000 mg | ORAL_CAPSULE | Freq: Four times a day (QID) | ORAL | Status: DC | PRN

## 2024-04-14 MED ORDER — COCONUT OIL OIL
1.0000 | TOPICAL_OIL | Status: DC | PRN
  Administered 2024-04-15: 1 via TOPICAL

## 2024-04-14 MED ORDER — OXYCODONE HCL 5 MG PO TABS: 5.0000 mg | ORAL_TABLET | Freq: Once | ORAL | Status: DC | PRN

## 2024-04-14 MED ORDER — ACETAMINOPHEN 10 MG/ML IV SOLN
INTRAVENOUS | Status: AC
  Filled 2024-04-14: qty 100

## 2024-04-14 MED ORDER — FENTANYL CITRATE (PF) 100 MCG/2ML IJ SOLN
INTRAMUSCULAR | Status: AC
  Filled 2024-04-14: qty 2

## 2024-04-14 MED ORDER — NALOXONE HCL 4 MG/10ML IJ SOLN: 1.0000 ug/kg/h | INTRAVENOUS | Status: DC | PRN

## 2024-04-14 MED ORDER — BUPIVACAINE IN DEXTROSE 0.75-8.25 % IT SOLN
INTRATHECAL | Status: DC | PRN
  Administered 2024-04-14: 1.6 mL via INTRATHECAL

## 2024-04-14 MED ORDER — PRENATAL MULTIVITAMIN CH
1.0000 | ORAL_TABLET | Freq: Every day | ORAL | Status: DC
  Administered 2024-04-15: 1 via ORAL
  Filled 2024-04-14: qty 1

## 2024-04-14 MED ORDER — MEPERIDINE HCL 25 MG/ML IJ SOLN: 6.2500 mg | INTRAMUSCULAR | Status: DC | PRN

## 2024-04-14 MED ORDER — ACETAMINOPHEN 10 MG/ML IV SOLN
INTRAVENOUS | Status: DC | PRN
  Administered 2024-04-14: 1000 mg via INTRAVENOUS

## 2024-04-14 MED ORDER — MORPHINE SULFATE (PF) 0.5 MG/ML IJ SOLN
INTRAMUSCULAR | Status: AC
  Filled 2024-04-14: qty 10

## 2024-04-14 MED ORDER — SIMETHICONE 80 MG PO CHEW: 80.0000 mg | CHEWABLE_TABLET | ORAL | Status: DC | PRN

## 2024-04-14 MED ORDER — SENNOSIDES-DOCUSATE SODIUM 8.6-50 MG PO TABS
2.0000 | ORAL_TABLET | Freq: Every day | ORAL | Status: DC
  Administered 2024-04-15: 2 via ORAL
  Filled 2024-04-14 (×2): qty 2

## 2024-04-14 MED ORDER — DIPHENHYDRAMINE HCL 25 MG PO CAPS: 25.0000 mg | ORAL_CAPSULE | ORAL | Status: DC | PRN

## 2024-04-14 MED ORDER — HYDROCORTISONE 1 % EX CREA
TOPICAL_CREAM | Freq: Four times a day (QID) | CUTANEOUS | Status: DC
  Filled 2024-04-14: qty 28

## 2024-04-14 MED ORDER — ACETAMINOPHEN 10 MG/ML IV SOLN: 1000.0000 mg | Freq: Once | INTRAVENOUS | Status: DC | PRN

## 2024-04-14 MED ORDER — ONDANSETRON HCL 4 MG/2ML IJ SOLN
INTRAMUSCULAR | Status: AC
  Filled 2024-04-14: qty 2

## 2024-04-14 MED ORDER — FENTANYL CITRATE (PF) 100 MCG/2ML IJ SOLN: INTRAMUSCULAR | Status: DC | PRN

## 2024-04-14 MED ORDER — DEXAMETHASONE SOD PHOSPHATE PF 10 MG/ML IJ SOLN
INTRAMUSCULAR | Status: DC | PRN
  Administered 2024-04-14: 10 mg via INTRAVENOUS

## 2024-04-14 MED ORDER — SCOPOLAMINE 1 MG/3DAYS TD PT72
1.0000 | MEDICATED_PATCH | Freq: Once | TRANSDERMAL | Status: DC
  Administered 2024-04-14: 1 mg via TRANSDERMAL
  Filled 2024-04-14: qty 1

## 2024-04-14 MED ORDER — DIPHENHYDRAMINE HCL 50 MG/ML IJ SOLN: 12.5000 mg | INTRAMUSCULAR | Status: DC | PRN

## 2024-04-14 MED ORDER — CEFAZOLIN SODIUM-DEXTROSE 2-4 GM/100ML-% IV SOLN
INTRAVENOUS | Status: AC
  Filled 2024-04-14: qty 100

## 2024-04-14 MED ORDER — IBUPROFEN 600 MG PO TABS
600.0000 mg | ORAL_TABLET | Freq: Four times a day (QID) | ORAL | Status: DC
  Administered 2024-04-15 – 2024-04-16 (×4): 600 mg via ORAL
  Filled 2024-04-14 (×4): qty 1

## 2024-04-14 MED ORDER — DROPERIDOL 2.5 MG/ML IJ SOLN: 0.6250 mg | Freq: Once | INTRAMUSCULAR | Status: DC | PRN

## 2024-04-14 MED ORDER — SODIUM CHLORIDE 0.9 % IR SOLN
Status: DC | PRN
  Administered 2024-04-14: 1

## 2024-04-14 MED ORDER — ONDANSETRON HCL 4 MG/2ML IJ SOLN
INTRAMUSCULAR | Status: DC | PRN
  Administered 2024-04-14: 4 mg via INTRAVENOUS

## 2024-04-14 SURGICAL SUPPLY — 31 items
BENZOIN TINCTURE PRP APPL 2/3 (GAUZE/BANDAGES/DRESSINGS) ×1 IMPLANT
CHLORAPREP W/TINT 26 (MISCELLANEOUS) ×2 IMPLANT
CLAMP UMBILICAL CORD (MISCELLANEOUS) ×1 IMPLANT
CLOTH BEACON ORANGE TIMEOUT ST (SAFETY) ×1 IMPLANT
DERMABOND ADVANCED .7 DNX12 (GAUZE/BANDAGES/DRESSINGS) IMPLANT
DRSG OPSITE POSTOP 4X10 (GAUZE/BANDAGES/DRESSINGS) ×1 IMPLANT
ELECTRODE REM PT RTRN 9FT ADLT (ELECTROSURGICAL) ×1 IMPLANT
EXTRACTOR VACUUM KIWI (MISCELLANEOUS) IMPLANT
GAUZE PAD ABD 7.5X8 STRL (GAUZE/BANDAGES/DRESSINGS) IMPLANT
GAUZE SPONGE 4X4 12PLY STRL LF (GAUZE/BANDAGES/DRESSINGS) IMPLANT
GLOVE BIO SURGEON STRL SZ 6.5 (GLOVE) ×1 IMPLANT
GLOVE BIOGEL PI IND STRL 7.0 (GLOVE) ×3 IMPLANT
GOWN STRL REUS W/TWL LRG LVL3 (GOWN DISPOSABLE) ×2 IMPLANT
KIT ABG SYR 3ML LUER SLIP (SYRINGE) IMPLANT
NEEDLE HYPO 25X5/8 SAFETYGLIDE (NEEDLE) IMPLANT
NS IRRIG 1000ML POUR BTL (IV SOLUTION) ×1 IMPLANT
PACK C SECTION WH (CUSTOM PROCEDURE TRAY) ×1 IMPLANT
PAD OB MATERNITY 4.3X12.25 (PERSONAL CARE ITEMS) ×1 IMPLANT
RTRCTR C-SECT PINK 25CM LRG (MISCELLANEOUS) ×1 IMPLANT
STRIP CLOSURE SKIN 1/2X4 (GAUZE/BANDAGES/DRESSINGS) ×1 IMPLANT
SUT MNCRL 0 VIOLET CTX 36 (SUTURE) ×3 IMPLANT
SUT PDS AB 0 CTX 60 (SUTURE) ×1 IMPLANT
SUT PLAIN 0 NONE (SUTURE) IMPLANT
SUT PLAIN ABS 2-0 CT1 27XMFL (SUTURE) ×1 IMPLANT
SUT VIC AB 0 CTX36XBRD ANBCTRL (SUTURE) ×2 IMPLANT
SUT VIC AB 2-0 CT1 TAPERPNT 27 (SUTURE) ×1 IMPLANT
SUT VIC AB 4-0 KS 27 (SUTURE) ×1 IMPLANT
TAPE CLOTH SURG 4X10 WHT LF (GAUZE/BANDAGES/DRESSINGS) IMPLANT
TOWEL OR 17X24 6PK STRL BLUE (TOWEL DISPOSABLE) ×1 IMPLANT
TRAY FOLEY W/BAG SLVR 14FR LF (SET/KITS/TRAYS/PACK) ×1 IMPLANT
WATER STERILE IRR 1000ML POUR (IV SOLUTION) ×1 IMPLANT

## 2024-04-14 NOTE — Transfer of Care (Signed)
 Immediate Anesthesia Transfer of Care Note  Patient: Carolyn Cole  Procedure(s) Performed: CESAREAN DELIVERY  Patient Location: PACU  Anesthesia Type:Spinal and Epidural  Level of Consciousness: awake, alert , and oriented  Airway & Oxygen Therapy: Patient Spontanous Breathing  Post-op Assessment: Report given to RN and Post -op Vital signs reviewed and stable  Post vital signs: Reviewed and stable  Last Vitals:  Vitals Value Taken Time  BP 104/71 04/14/24 13:45  Temp    Pulse 79 04/14/24 13:48  Resp 18 04/14/24 13:48  SpO2 97 % 04/14/24 13:48  Vitals shown include unfiled device data.  Last Pain:  Vitals:   04/14/24 1116  TempSrc: Oral  PainSc: 0-No pain         Complications: No notable events documented.

## 2024-04-14 NOTE — Anesthesia Procedure Notes (Signed)
 Combined Spinal Epidural Patient location during procedure: OR Start time: 04/14/2024 12:10 PM End time: 04/14/2024 12:15 PM  Staffing Anesthesiologist: Tilford Franky BIRCH, MD Performed: anesthesiologist   Preanesthetic Checklist Completed: patient identified, IV checked, site marked, risks and benefits discussed, surgical consent, monitors and equipment checked, pre-op evaluation and timeout performed  Epidural Patient position: sitting Prep: DuraPrep Patient monitoring: heart rate, cardiac monitor, continuous pulse ox and blood pressure Location: L3-L4 Injection technique: LOR saline  Needle:  Needle type: Pencan  Needle length: 12.7 cm Needle type: Tuohy  Needle gauge: 17 G Needle length: 9 cm Catheter type: closed end flexible Catheter size: 19 Gauge  Additional Notes CSF via spinal only.   Functioning IV was confirmed and monitors were applied. Sterile prep and drape, including hand hygiene and sterile gloves were used. The patient was positioned and the back was prepped. The skin was anesthetized with lidocaine . Free flow of clear CSF was obtained prior to injecting local anesthetic into the CSF. The spinal needle aspirated freely following injection. The needle was carefully withdrawn. The patient tolerated the procedure well. Reason for block:surgical anesthesia

## 2024-04-14 NOTE — Op Note (Signed)
 CESAREAN SECTION Procedure Note  Patient: Carolyn Cole DOB 08-09-1990  Preoperative Diagnosis: IUP at [redacted]w[redacted]d, history of extensive myomectomy Postoperative Diagnosis: same, breech presentation, postpartum hemorrhage by QBL (blood loss not clinically c/w PPH, clinical blood loss more c/w 800cc), delivered  Procedure: primary low transverse C-section     Surgeon: Rubie Husky, MD  Assistant surgeon: Charmaine Oz  A skilled assistant was required for this case due to its complexity.  Anesthesia: Spinal anesthesia   Findings: Normal appearing uterus, fallopian tubes bilaterally, and ovaries bilaterally.  Viable female infant in frank breech presentation delivered at 12:41pm with weight 7lb6.5oz; Apgars 8 and 8.  Estimated Blood Loss:  1111cc (blood loss not clinically c/w PPH, clinical blood loss more c/w 800cc)         Specimens: Placenta to L&D         Complications: postpartum hemorrhage by QBL (blood loss not clinically c/w PPH)         Disposition: PACU - hemodynamically stable.         Condition: stable    Description of Procedure: The patient was taken to the operating room where spinal anesthesia was placed and found to be adequate. The patient was placed in the dorsal supine position.  Fetal heart tones were confirmed. SCD were applied and cycling. A foley catheter was inserted and draining. Ancef 2g were given for infection prophylaxis and TXA was given for H OBH risk. The patient was subsequently prepped and draped in the normal sterile fashion.  A routine pre-operative time out was performed.  A low transverse skin incision was made with a scalpel and carried down to the level of the fascia with the Bovie.  The fascia was incised in the midline with the scalpel and extended laterally with curved Mayo scissors.  Kocher clamps were applied to the superior fascial edge and the fascia was dissected off the rectus muscle sharply using the Mayo scissors. The rectus  muscles then were separated in the midline.  The peritoneum was found free of adherent bowel and the peritoneal cavity was entered bluntly.  The uterus was identified and the alexis retractor was placed intraperitoneal.  A bladder flap was then created sharply with Metzenbaum scissors and separated from the lower uterine segment digitally.   A low transverse hysterotomy was then made with a scalpel.  The infant was found in frank breech presentation was delivered atraumatically and without difficulty with standard breech maneuvers. No nuchal. Baby had good tone and cry and routine bulb suction of nares was performed. After 60 seconds of delayed cord clamping the cord was clamped and cut and the infant was handed off to the pediatricians. The placenta was delivered with gentle traction on umbilical cord and manual massage of the uterine fundus.  The uterus was cleared of all clot and debris.  The hysterotomy was then closed with 0 monocryl in a running locked, two layer fashion. A figure of 8 using 0 monocryl was placed on an oozing area at the left corner of the hysterotomy. The hysterotomy was found to be hemostatic after light Bovie use on serosal edges. The Alexis retractor was removed. The peritoneum was closed in a vertical continuous fashion using 2-0 Vicryl. Light Bovie use on the muscle body rendered it hemostatic. The fascia was closed with a 0 PDS suture in a continuous running fashion. The subcutaneous tissue was irrigated and rendered hemostatic with cautery.  The subcutaneous layer was subsequently closed with 2-0 plain gut in an interrupted fashion.  The skin was closed with 4-0 vicryl  in a running subcuticular fashion.  Sponge, lap and needle counts were correct. Dermabond and a pressure dressing were placed on the incision.  The patient and baby were both doing well when I left the OR.  Rubie Husky, MD 04/14/2024; 13:33

## 2024-04-14 NOTE — H&P (Signed)
 Carolyn Cole is a 34 y.o. G18P1001 female at [redacted]w[redacted]d presenting for scheduled C-section for history of extensive open myomectomy. She reports no changes since her last clinic visit. Denies dFM, LOF, VB, or contractions.   Her pregnancy is otherwise complicated by: -vaginismus -scoliosis: was able to get epidural with prior delivery -BMI 44: plan post-op Lovenox -h/o pre-eclampsia in prior pregnancy. Normotensive in this pregnancy. -small BPD (4.9%)/ HC (8.9%) on 32w US . Did not meet criteria for microcephaly.  OB History     Gravida  2   Para  1   Term  1   Preterm      AB      Living  1      SAB      IAB      Ectopic      Multiple  0   Live Births  1          Past Medical History:  Diagnosis Date   Anemia    Hx. of iron deficiency   Anxiety    Concussion 04/13/2010   Fibroid    Fibroids    Heart murmur    As an infant and it resolved   History of postpartum pre-eclampsia    Papilledema, both eyes    Vertigo    Past Surgical History:  Procedure Laterality Date   APPENDECTOMY  2019   MYOMECTOMY N/A 11/11/2022   Procedure: ABDOMINAL MYOMECTOMY;  Surgeon: Jeralyn Crutch, MD;  Location: [facility] OR;  Service: Gynecology;  Laterality: N/A;   VAGINAL DELIVERY  01/29/2021   Family History: family history includes Chiari malformation in her sister; Gout in her paternal grandmother; Hypertension in her father and mother; Thyroid  disease in her mother; Ulcers in her father. Social History:  reports that she has never smoked. She has never used smokeless tobacco. She reports that she does not currently use alcohol. She reports that she does not use drugs.     Maternal Diabetes: No Genetic Screening: Normal Maternal Ultrasounds/Referrals: Other: small BPD (4.9%)/ (HC 8.9%) on 32w US  Fetal Ultrasounds or other Referrals:  None Maternal Substance Abuse:  No Significant Maternal Medications:  Meds include: Other: ASA Significant Maternal Lab Results:  Group B Strep  negative Number of Prenatal Visits:greater than 3 verified prenatal visits Maternal Vaccinations:TDap   Review of Systems  All other systems reviewed and are negative.  Maternal Medical History:  Prenatal complications: No bleeding, cholelithiasis, HIV, PIH, infection, IUGR, nephrolithiasis, oligohydramnios, placental abnormality, polyhydramnios, pre-eclampsia, preterm labor, substance abuse, thrombocytopenia or thrombophilia.   Prenatal Complications - Diabetes: none.     Blood pressure 125/73, pulse 97, temperature 98.5 F (36.9 C), temperature source Oral, resp. rate 18, height 5' 4 (1.626 m), weight 115.3 kg, last menstrual period 07/30/2023, SpO2 98%.   Fetal Exam Fetal Monitor Review: Mode: hand-held doppler probe.   Baseline rate: 148.    Physical Exam Vitals reviewed. Exam conducted with a chaperone present.  Constitutional:      Appearance: Normal appearance.  HENT:     Nose: Nose normal.  Eyes:     Extraocular Movements: Extraocular movements intact.  Cardiovascular:     Comments: Well perfused Pulmonary:     Effort: Pulmonary effort is normal.  Abdominal:     Comments: Gravid, non-tender  Musculoskeletal:        General: Normal range of motion.     Cervical back: Normal range of motion.  Skin:    General: Skin is warm and dry.  Neurological:  Mental Status: She is alert and oriented to person, place, and time.  Psychiatric:        Mood and Affect: Mood normal.        Behavior: Behavior normal.        Thought Content: Thought content normal.        Judgment: Judgment normal.     Prenatal labs: ABO, Rh: --/--/O POS (12/31 1009) Antibody: NEG (12/31 1009) Rubella: Immune (10/07 0000) RPR: NON REACTIVE (12/31 1009)  HBsAg: Negative (10/07 0000)  HIV: Non-reactive (10/07 0000)  GBS:   negative  Assessment/Plan: Carolyn Cole is a 34 y.o. G22P1001 female at [redacted]w[redacted]d presenting for scheduled C-section for history of extensive open  myomectomy. -scheduled C-section: Ancef for infection ppx. SCDs for VTE ppx. TXA for bleeding ppx given H OBH risk. Consent signed, R/B discussed as below.  We thoroughly discussed the risks and benefits of of a C-section. Risks include bleeding (she would accept a blood transfusion in an emergency), damage to surrounding structures, and infection. We discussed that any of these complications could lead to the need for longer hospitalization, need for additional procedures/surgeries, or need for more medications. All questions were answered and patient voiced understanding of risks.She would like to proceed with the surgery.  -scoliosis: was able to get epidural with prior delivery -BMI 44: Plan Traxxi. Plan post-op Lovenox -h/o pre-eclampsia in prior pregnancy. Normotensive in this pregnancy. -small BPD (4.9%)/ HC (8.9%) on 32w US . Did not meet criteria for microcephaly.  Dispo: To the OR.   Carolyn Cole 04/14/2024, 11:39 AM

## 2024-04-14 NOTE — Anesthesia Preprocedure Evaluation (Addendum)
 "                                  Anesthesia Evaluation  Patient identified by MRN, date of birth, ID band Patient awake    Reviewed: Allergy & Precautions, NPO status , Patient's Chart, lab work & pertinent test results  Airway Mallampati: III       Dental no notable dental hx.    Pulmonary asthma    Pulmonary exam normal        Cardiovascular hypertension, Normal cardiovascular exam     Neuro/Psych   Anxiety     negative neurological ROS     GI/Hepatic negative GI ROS, Neg liver ROS,,,  Endo/Other  negative endocrine ROS    Renal/GU negative Renal ROS     Musculoskeletal   Abdominal   Peds  Hematology  (+) Blood dyscrasia, anemia   Anesthesia Other Findings   Reproductive/Obstetrics (+) Pregnancy                              Anesthesia Physical Anesthesia Plan  ASA: 3  Anesthesia Plan: Combined Spinal and Epidural   Post-op Pain Management: Ofirmev  IV (intra-op)*   Induction: Intravenous  PONV Risk Score and Plan: 3 and Ondansetron  and Midazolam   Airway Management Planned: Natural Airway  Additional Equipment: None  Intra-op Plan:   Post-operative Plan:   Informed Consent: I have reviewed the patients History and Physical, chart, labs and discussed the procedure including the risks, benefits and alternatives for the proposed anesthesia with the patient or authorized representative who has indicated his/her understanding and acceptance.       Plan Discussed with: CRNA  Anesthesia Plan Comments: (Lab Results      Component                Value               Date                      WBC                      8.6                 04/12/2024                HGB                      11.6 (L)            04/12/2024                HCT                      33.3 (L)            04/12/2024                MCV                      95.1                04/12/2024                PLT  228                  04/12/2024           )         Anesthesia Quick Evaluation  "

## 2024-04-14 NOTE — Anesthesia Postprocedure Evaluation (Signed)
"   Anesthesia Post Note  Patient: Carolyn Cole  Procedure(s) Performed: CESAREAN DELIVERY     Patient location during evaluation: PACU Anesthesia Type: Spinal Level of consciousness: oriented and awake and alert Pain management: pain level controlled Vital Signs Assessment: post-procedure vital signs reviewed and stable Respiratory status: spontaneous breathing, respiratory function stable and patient connected to nasal cannula oxygen Cardiovascular status: blood pressure returned to baseline and stable Postop Assessment: no headache, no backache and no apparent nausea or vomiting Anesthetic complications: no   No notable events documented.  Last Vitals:  Vitals:   04/14/24 1458 04/14/24 1559  BP: 107/60 (!) 103/52  Pulse: 71 74  Resp: 16 16  Temp: 36.7 C 36.7 C  SpO2: 94%     Last Pain:  Vitals:   04/14/24 1600  TempSrc:   PainSc: 0-No pain   Pain Goal:                   Franky JONETTA Bald      "

## 2024-04-14 NOTE — Lactation Note (Signed)
 This note was copied from a baby's chart. Lactation Consultation Note  Patient Name: Carolyn Cole Unijb'd Date: 04/14/2024 Age:33 hours  Reason for consult: Initial assessment;Early term 37-38.6wks;Breastfeeding assistance  P2, [redacted]w[redacted]d, ETI, C/S  Initial LC visit to see P2 mother and her baby Carolyn Sanai. Mother reports baby has latched well since delivery. Baby was showing feeding cues and mother was receptive to latch assistance.   Prior to assisting mother with latching, mother was taught hand expression. She was able to express colostrum with ease. Basic breastfeeding education with baby placed in football hold with pillow support. Baby latched with wide gape and rhythmic sucking. Intermittent swallows noted. Mother was assisted with making an air space for baby without pulling breast out of baby's mouth. From mother's angle of vision, she feels her breast was covering baby's face. Dad at bedside, supportive and attentive.   Mother reports she was not able to establish breastfeeding with her first daughter due to early onset of painful, firm breast/ engorgement. She pumped for 18 months. Mother states she needs guidance with actually breastfeeding and her goal is to breast feed opposed to pumping.   Mother encouraged to latch baby with feeding cues, place baby skin to skin if not latching, and call for assistance with breastfeeding as needed.   Mom made aware of O/P services, breastfeeding support groups, community resources, and our phone # for post-discharge questions.    Maternal Data Has patient been taught Hand Expression?: Yes Does the patient have breastfeeding experience prior to this delivery?: Yes How long did the patient breastfeed?: Pumped for 18 months  Feeding Mother's Current Feeding Choice: Breast Milk  LATCH Score Latch: Grasps breast easily, tongue down, lips flanged, rhythmical sucking.  Audible Swallowing: A few with stimulation  Type of Nipple: Everted at  rest and after stimulation  Comfort (Breast/Nipple): Soft / non-tender  Hold (Positioning): Assistance needed to correctly position infant at breast and maintain latch.  LATCH Score: 8    Interventions Interventions: Breast feeding basics reviewed;Assisted with latch;Skin to skin;Hand express;Breast compression;Adjust position;Support pillows;Education;LC Services brochure;CDC milk storage guidelines;CDC Guidelines for Breast Pump Cleaning  Discharge Pump: Personal;DEBP  Consult Status Consult Status: Follow-up Date: 04/15/24 Follow-up type: In-patient    Joshua Line M 04/14/2024, 7:22 PM

## 2024-04-15 ENCOUNTER — Encounter (HOSPITAL_COMMUNITY): Payer: Self-pay | Admitting: Obstetrics and Gynecology

## 2024-04-15 LAB — CBC
HCT: 28.5 % — ABNORMAL LOW (ref 36.0–46.0)
Hemoglobin: 9.7 g/dL — ABNORMAL LOW (ref 12.0–15.0)
MCH: 32.1 pg (ref 26.0–34.0)
MCHC: 34 g/dL (ref 30.0–36.0)
MCV: 94.4 fL (ref 80.0–100.0)
Platelets: 188 K/uL (ref 150–400)
RBC: 3.02 MIL/uL — ABNORMAL LOW (ref 3.87–5.11)
RDW: 14 % (ref 11.5–15.5)
WBC: 17.1 K/uL — ABNORMAL HIGH (ref 4.0–10.5)
nRBC: 0 % (ref 0.0–0.2)

## 2024-04-15 MED ORDER — POLYETHYLENE GLYCOL 3350 17 G PO PACK
17.0000 g | PACK | Freq: Every day | ORAL | Status: DC
  Administered 2024-04-15: 17 g via ORAL
  Filled 2024-04-15 (×2): qty 1

## 2024-04-15 NOTE — Progress Notes (Signed)
 POSTPARTUM POSTOP PROGRESS NOTE  POD #1  Subjective:  No acute events overnight.  Pt denies problems with ambulating, voiding or po intake.  She denies nausea or vomiting.  Pain is well controlled.  She has not had flatus. She has not had bowel movement.  Lochia Minimal.   Objective: Blood pressure (!) 102/53, pulse 69, temperature 98.4 F (36.9 C), temperature source Oral, resp. rate 18, height 5' 4 (1.626 m), weight 115.3 kg, last menstrual period 07/30/2023, SpO2 100%, unknown if currently breastfeeding.  Physical Exam:  General: alert, cooperative and no distress Lochia:normal flow Chest: CTAB Heart: RRR no m/r/g Abdomen: +BS, soft, nontender Uterine Fundus: firm, 2cm below umbilicus. Pressure dressing intact, neg drainage Extremities: neg edema, neg calf TTP BL, neg Homans BL  Recent Labs    04/14/24 1558 04/15/24 0417  HGB 11.1* 9.7*  HCT 32.8* 28.5*    Assessment/Plan:  ASSESSMENT: Carolyn Cole is a 34 y.o. [id number] s/p PLTCS @ [redacted]w[redacted]d for h/o myomectomy with fetal malpresentation. PNC c/b BMI 44, scoliosis, h/o PreE.   Plan for discharge tomorrow PPX lovenox  while in house Routine postop care Normotensive since delivery    LOS: 1 day

## 2024-04-15 NOTE — Lactation Note (Signed)
 This note was copied from a baby's chart. Lactation Consultation Note  Patient Name: Carolyn Cole Unijb'd Date: 04/15/2024 Age:33 hours Reason for consult: Follow-up assessment;Mother's request;Early term 37-38.6wks.See [name]: MR: C/S, ETI and anemia  P2, [name] latched infant on her right breast using the football hold position, with pillow support, LC discussed breast stimulation techniques to keep infant actively breastfeeding such as : breast compression, talking to infant and gently stroking infant behind her neck and shoulder. Infant was still actively breastfeeding after 13 minutes when LC left the room.[name] will continue to breastfeed infant by cues, on demand, 8-12 times within 24 hours, skin to skin. LC reviewed hand pump that was given and switched [name] flange size to 18 mm breast flange which was better fitted. [name] informed LC she pumped exclusively with her 1st child for 18 months. [name] knows if she chooses she can hand express or use hand pump after infant latches to give extra volume of colostrum. [name] concern about supply and easily expressed 2 mls of colostrum she will offer to infant after infant finishes feeding at the breast. [name] will continue to breastfeed infant by cues, 8+ time within 24 hours, skin to skin. [name] knows to call for further latch assistance if needed.    Maternal Data How long did the patient breastfeed?: [name], had latch fdifficulties with her 1st child, she exclusively pumped for 18 months  Feeding Mother's Current Feeding Choice: Breast Milk  LATCH Score Latch: Grasps breast easily, tongue down, lips flanged, rhythmical sucking.  Audible Swallowing: Spontaneous and intermittent  Type of Nipple: Everted at rest and after stimulation  Comfort (Breast/Nipple): Soft / non-tender  Hold (Positioning): Assistance needed to correctly position infant at breast and maintain latch.  LATCH Score: 9   Lactation Tools Discussed/Used    Interventions Interventions:  Skin to skin;Assisted with latch;Support pillows;Adjust position;Breast compression;Position options;Expressed milk;Hand pump  Discharge    Consult Status Consult Status: Follow-up Date: 04/16/24 Follow-up type: In-patient    [name] Batter 04/15/2024, 6:33 PM

## 2024-04-15 NOTE — Progress Notes (Signed)
 Carolyn Cole was referred for history of anxiety. * Referral screened out by Clinical Social Worker because none of the following criteria appear to apply: ~ History of anxiety/depression during this pregnancy, or of post-partum depression following prior delivery. ~ Diagnosis of anxiety and/or depression within last 3 years OR * Carolyn Cole's symptoms currently being treated with medication and/or therapy. Per review of prenatal care records, Carolyn Cole sees a therapist. Van score = 8.   Please contact the Clinical Social Worker if needs arise, by [facility] request, or if Carolyn Cole scores greater than 9/yes to question 10 on Edinburgh Postpartum Depression Screen.  Signed,  Sharyne LOIS Roulette, MSW, LCSWA, LCASA 04/15/2024 9:13 AM

## 2024-04-16 MED ORDER — IBUPROFEN 800 MG PO TABS: 800.0000 mg | ORAL_TABLET | Freq: Three times a day (TID) | ORAL | 1 refills | Status: DC | PRN

## 2024-04-16 MED ORDER — OXYCODONE HCL 5 MG PO TABS: 5.0000 mg | ORAL_TABLET | Freq: Four times a day (QID) | ORAL | 0 refills | Status: DC | PRN

## 2024-04-16 NOTE — Lactation Note (Signed)
 This note was copied from a baby's chart. Lactation Consultation Note  Patient Name: Carolyn Cole Unijb'd Date: 04/16/2024 Age:33 hours Reason for consult: Mother's request;Early term 37-38.6wks;Engorgement  P2. Mom called for LC d/t breast pain and knots to breast. Lc set up DEBP. Mom shown how to use DEBP & how to disassemble, clean, & reassemble parts.  Mom pumped 90 ml. Mom stated such relief. Mom has had ICE to breast.  Engorgement management reviewed. Milk storage reviewed. Maternal Data Has patient been taught Hand Expression?: Yes Does the patient have breastfeeding experience prior to this delivery?: Yes How long did the patient breastfeed?: pumped 18 months  Feeding    LATCH Score       Type of Nipple: Everted at rest and after stimulation  Comfort (Breast/Nipple): Filling, red/small blisters or bruises, mild/mod discomfort (breast filling/knots/breast hurting)         Lactation Tools Discussed/Used Tools: Pump;Flanges Flange Size: 18 Breast pump type: Double-Electric Breast Pump Pump Education: Setup, frequency, and cleaning;Milk Storage Reason for Pumping: breast painful and has knots/breast filling Pumping frequency: PRN Pumped volume: 90 mL  Interventions Interventions: Breast compression;DEBP;Ice;Education  Discharge Discharge Education: Engorgement and breast care Pump: DEBP (Medela/ a newone, doesn't remember name)  Consult Status Consult Status: Follow-up Date: 04/16/24 Follow-up type: In-patient    Rishikesh Khachatryan G 04/16/2024, 12:25 AM

## 2024-04-16 NOTE — Progress Notes (Addendum)
 POSTPARTUM POSTOP PROGRESS NOTE  POD #2  Subjective:  No acute events overnight.  Pt denies problems with ambulating, voiding or po intake.  She denies nausea or vomiting.  Pain is well controlled.  She has had flatus. She has had bowel movement.  Lochia Minimal. Milk coming in this morning  Objective: Blood pressure 99/61, pulse 81, temperature (!) 97.4 F (36.3 C), temperature source Oral, resp. rate 18, height 5' 4 (1.626 m), weight 115.3 kg, last menstrual period 07/30/2023, SpO2 100%, unknown if currently breastfeeding.  Physical Exam:  General: alert, cooperative and no distress Lochia:normal flow Chest: CTAB Heart: RRR no m/r/g Abdomen: +BS, soft, nontender Uterine Fundus: firm, 2cm below umbilicus. Honeycomb intact, neg drainage Extremities: neg edema, neg calf TTP BL, neg Homans BL  Recent Labs    04/14/24 1558 04/15/24 0417  HGB 11.1* 9.7*  HCT 32.8* 28.5*    Assessment/Plan:  ASSESSMENT: Carolyn Cole is a 34 y.o. [id number] s/p PLTCS @ [redacted]w[redacted]d for h/o myomectomy with fetal malpresentation. PNC c/b BMI 44, scoliosis, h/o PreE.   Discharge home and Breastfeeding PPX lovenox  while in house Routine postop care Normotensive since delivery    LOS: 2 days

## 2024-04-16 NOTE — Discharge Summary (Signed)
 "    Postpartum Discharge Summary  Date of Service updated     Patient Name: Carolyn Cole DOB: [birth year] MRN: [id number]  Date of admission: 04/14/2024 Delivery date:04/14/2024 Delivering provider: CLAIRE RAMAN A Date of discharge: 04/16/2024  Admitting diagnosis: Uterine scar from previous surgery affecting pregnancy [O34.29] Post-operative state [Z98.890] Intrauterine pregnancy: [redacted]w[redacted]d     Secondary diagnosis:  Principal Problem:   Post-operative state  Additional problems: GHTN    Discharge diagnosis: Term Pregnancy Delivered and Gestational Hypertension                                              Post partum procedures:none Augmentation: N/A Complications: None  Hospital course: Sceduled C/S   34 y.o. yo G2P2002 at [redacted]w[redacted]d was admitted to the hospital 04/14/2024 for scheduled cesarean section with the following indication:fetal malpresentation and Prior Uterine Surgery.Delivery details are as follows:  Membrane Rupture Time/Date: 12:40 PM,04/14/2024  Delivery Method:C-Section, Low Transverse Operative Delivery:N/A Details of operation can be found in separate operative note.  Patient had a postpartum course complicated by asx GHTN.  She is ambulating, tolerating a regular diet, passing flatus, and urinating well. Patient is discharged home in stable condition on  04/16/2024        Newborn Data: Birth date:04/14/2024 Birth time:12:41 PM Gender:Female Living status:Living Apgars:8 ,8  Weight:3360 g    Immunizations administered: Immunization History  Administered Date(s) Administered   Tdap 04/13/2010    Physical exam  Vitals:   04/15/24 1038 04/15/24 1341 04/15/24 2247 04/16/24 0504  BP: (!) 118/56 117/62 118/69 99/61  Pulse: 82 78 69 81  Resp: 18 18 17 18   Temp: 98.7 F (37.1 C) 98.5 F (36.9 C) 98.2 F (36.8 C) (!) 97.4 F (36.3 C)  TempSrc: Oral Oral Oral Oral  SpO2: 97% 100% 100% 100%  Weight:      Height:       General: alert, cooperative, and no  distress Lochia: appropriate Uterine Fundus: firm Incision: Healing well with no significant drainage, No significant erythema, Dressing is clean, dry, and intact DVT Evaluation: No evidence of DVT seen on physical exam. Negative Homan's sign. No cords or calf tenderness. Labs: Lab Results  Component Value Date   WBC 17.1 (H) 04/15/2024   HGB 9.7 (L) 04/15/2024   HCT 28.5 (L) 04/15/2024   MCV 94.4 04/15/2024   PLT 188 04/15/2024      Latest Ref Rng & Units 04/14/2024    3:58 PM  CMP  Creatinine 0.44 - 1.00 mg/dL 9.43    Edinburgh Score:    04/14/2024    4:00 PM  Edinburgh Postnatal Depression Scale Screening Tool  I have been able to laugh and see the funny side of things. 1  I have looked forward with enjoyment to things. 0  I have blamed myself unnecessarily when things went wrong. 1  I have been anxious or worried for no good reason. 1  I have felt scared or panicky for no good reason. 0  Things have been getting on top of me. 2  I have been so unhappy that I have had difficulty sleeping. 1  I have felt sad or miserable. 1  I have been so unhappy that I have been crying. 1  The thought of harming myself has occurred to me. 0  Edinburgh Postnatal Depression Scale Total 8  After visit meds:  Allergies as of 04/16/2024       Reactions   Oxycodone  Hcl Other (See Comments)   Felt out of it        Medication List     STOP taking these medications    acetaminophen  325 MG tablet Commonly known as: Tylenol    aspirin 81 MG chewable tablet   cyclobenzaprine  10 MG tablet Commonly known as: FLEXERIL        TAKE these medications    ibuprofen  800 MG tablet Commonly known as: ADVIL  Take 1 tablet (800 mg total) by mouth every 8 (eight) hours as needed.   oxyCODONE  5 MG immediate release tablet Commonly known as: Oxy IR/ROXICODONE  Take 1 tablet (5 mg total) by mouth every 6 (six) hours as needed for severe pain (pain score 7-10).   PRENATAL 1 PO Take 1  tablet by mouth daily.         Discharge home in stable condition Infant Feeding: Breast Infant Disposition:home with mother Discharge instruction: per After Visit Summary and Postpartum booklet. Activity: Advance as tolerated. Pelvic rest for 6 weeks.  Diet: routine diet Anticipated Birth Control: Unsure Postpartum Appointment:6 weeks Additional Postpartum F/U: Incision/BP check 1-2wks Follow up Visit: [facility]   04/16/2024 Lavonia CHRISTELLA Guppy, MD   "

## 2024-04-16 NOTE — Lactation Note (Signed)
 This note was copied from a baby's chart. Lactation Consultation Note  Patient Name: Carolyn Cole Unijb'd Date: 04/16/2024 Age:33 hours Reason for consult: RN request;Early term 37-38.6wks;Infant weight loss;Engorgement  P2. RN concerned about weight loss. In 42 hrs the baby has had 5 voids, 6 stools, and 1 documented large emesis. The baby is gulping at the breast. Mom's breast are filling again w/knots and are heavy. Baby hadn't been off of the Lt. Breast long. Dad was changing diaper. When he finishe placed baby on the Rt. Breast to see if she is softening the breast any, milk transfer. LC encouraged mom to assess for transfer. LC heard gulping at the breast. Mom could hardly hold her eyes open. LC placed baby in crib. Praised mom for good feeding. Reported to RN. Maternal Data Has patient been taught Hand Expression?: Yes  Feeding    LATCH Score Latch: Grasps breast easily, tongue down, lips flanged, rhythmical sucking.  Audible Swallowing: Spontaneous and intermittent  Type of Nipple: Everted at rest and after stimulation  Comfort (Breast/Nipple): Filling, red/small blisters or bruises, mild/mod discomfort (tender)  Hold (Positioning): Assistance needed to correctly position infant at breast and maintain latch.  LATCH Score: 8   Lactation Tools Discussed/Used    Interventions Interventions: Breast feeding basics reviewed;Assisted with latch;Skin to skin;Breast massage;Hand express;Breast compression;Adjust position;Support pillows;Position options;Education  Discharge    Consult Status Consult Status: Follow-up Date: 04/16/24 Follow-up type: In-patient    Jiana Lemaire G 04/16/2024, 6:52 AM

## 2024-04-22 ENCOUNTER — Telehealth (HOSPITAL_COMMUNITY): Payer: Self-pay

## 2024-04-22 NOTE — Telephone Encounter (Signed)
 04/22/2024 1152  Name: Carolyn Cole MRN: [id number] DOB: March 21, 1991  Reason for Call:  Transition of Care Hospital Discharge Call  Contact Status: Patient Contact Status: Complete  Language assistant needed:          Follow-Up Questions: Do You Have Any Concerns About Your Health As You Heal From Delivery?: Yes What Concerns Do You Have About Your Health?: Patient states that she has some swelling in her feet that has improved with elevation but returns when she is on her feet. Patient states that she called her doctor about this concern and office told her to elevate her feet and she has been doing that. RN reviewed preeclampsia and warning signs with patient. Patient states that she has not checked her BP and does not have a BP cuff at home. Patient also reports that she has had pain despite taking pain medications that are prescribed. She states that the oxcodone makes her sleepy mainly. RN told patient to call her provider and discuss concerns and schedule an appointment. Patient also states that she wants to talk to provider about her incision. She states that the honeycomb dressing is off but that she has not visualized incision. RN reviewed incision care and signs of infection to report to the provider. Patient has no other questions or concerns. Do You Have Any Concerns About Your Infants Health?: No (Patient reports that baby is doing very well and has almost gained back to birthweight. Patient has no questions or concerns about baby.)  Edinburgh Postnatal Depression Scale:  In the Past 7 Days: I have been able to laugh and see the funny side of things.: Not quite so much now I have looked forward with enjoyment to things.: As much as I ever did I have blamed myself unnecessarily when things went wrong.: Not very often I have been anxious or worried for no good reason.: Yes, sometimes I have felt scared or panicky for no good reason.: No, not much Things have been getting on top of  me.: Yes, sometimes I haven't been coping as well as usual I have been so unhappy that I have had difficulty sleeping.: Not very often I have felt sad or miserable.: Not very often I have been so unhappy that I have been crying.: No, never The thought of harming myself has occurred to me.: Never Edinburgh Postnatal Depression Scale Total: 9  PHQ2-9 Depression Scale:     Discharge Follow-up: Edinburgh score requires follow up?: No Patient was advised of the following resources:: Support Group, Breastfeeding Support Group Patient referred to:: OB  Post-discharge interventions: Reviewed Newborn Safe Sleep Practices  Signature  Rosaline Deretha PEAK

## 2024-05-05 ENCOUNTER — Observation Stay (HOSPITAL_COMMUNITY)
Admission: AD | Admit: 2024-05-05 | Discharge: 2024-05-06 | Disposition: A | Attending: Internal Medicine | Admitting: Internal Medicine

## 2024-05-05 ENCOUNTER — Inpatient Hospital Stay (HOSPITAL_COMMUNITY)

## 2024-05-05 DIAGNOSIS — E66813 Obesity, class 3: Secondary | ICD-10-CM | POA: Diagnosis not present

## 2024-05-05 DIAGNOSIS — M542 Cervicalgia: Secondary | ICD-10-CM

## 2024-05-05 DIAGNOSIS — M7918 Myalgia, other site: Principal | ICD-10-CM | POA: Insufficient documentation

## 2024-05-05 DIAGNOSIS — R519 Headache, unspecified: Secondary | ICD-10-CM

## 2024-05-05 DIAGNOSIS — S161XXA Strain of muscle, fascia and tendon at neck level, initial encounter: Principal | ICD-10-CM

## 2024-05-05 DIAGNOSIS — G932 Benign intracranial hypertension: Secondary | ICD-10-CM | POA: Diagnosis present

## 2024-05-05 DIAGNOSIS — M5481 Occipital neuralgia: Secondary | ICD-10-CM

## 2024-05-05 DIAGNOSIS — Z9889 Other specified postprocedural states: Secondary | ICD-10-CM | POA: Insufficient documentation

## 2024-05-05 DIAGNOSIS — Z8709 Personal history of other diseases of the respiratory system: Secondary | ICD-10-CM | POA: Insufficient documentation

## 2024-05-05 LAB — CBC WITH DIFFERENTIAL/PLATELET
Abs Immature Granulocytes: 0.01 K/uL (ref 0.00–0.07)
Basophils Absolute: 0 K/uL (ref 0.0–0.1)
Basophils Relative: 1 %
Eosinophils Absolute: 0.3 K/uL (ref 0.0–0.5)
Eosinophils Relative: 9 %
HCT: 39.8 % (ref 36.0–46.0)
Hemoglobin: 13.3 g/dL (ref 12.0–15.0)
Immature Granulocytes: 0 %
Lymphocytes Relative: 26 %
Lymphs Abs: 0.9 K/uL (ref 0.7–4.0)
MCH: 31.5 pg (ref 26.0–34.0)
MCHC: 33.4 g/dL (ref 30.0–36.0)
MCV: 94.3 fL (ref 80.0–100.0)
Monocytes Absolute: 0.3 K/uL (ref 0.1–1.0)
Monocytes Relative: 8 %
Neutro Abs: 2 K/uL (ref 1.7–7.7)
Neutrophils Relative %: 56 %
Platelets: 270 K/uL (ref 150–400)
RBC: 4.22 MIL/uL (ref 3.87–5.11)
RDW: 13.8 % (ref 11.5–15.5)
WBC: 3.7 K/uL — ABNORMAL LOW (ref 4.0–10.5)
nRBC: 0 % (ref 0.0–0.2)

## 2024-05-05 LAB — COMPREHENSIVE METABOLIC PANEL WITH GFR
ALT: 34 U/L (ref 0–44)
AST: 30 U/L (ref 15–41)
Albumin: 4.7 g/dL (ref 3.5–5.0)
Alkaline Phosphatase: 78 U/L (ref 38–126)
Anion gap: 10 (ref 5–15)
BUN: 10 mg/dL (ref 6–20)
CO2: 27 mmol/L (ref 22–32)
Calcium: 10.2 mg/dL (ref 8.9–10.3)
Chloride: 104 mmol/L (ref 98–111)
Creatinine, Ser: 0.83 mg/dL (ref 0.44–1.00)
GFR, Estimated: >60 mL/min
Glucose, Bld: 86 mg/dL (ref 70–99)
Potassium: 4.6 mmol/L (ref 3.5–5.1)
Sodium: 140 mmol/L (ref 135–145)
Total Bilirubin: 0.4 mg/dL (ref 0.0–1.2)
Total Protein: 7 g/dL (ref 6.5–8.1)

## 2024-05-05 LAB — HEMOGLOBIN A1C
Hgb A1c MFr Bld: 5.4 % (ref 4.8–5.6)
Mean Plasma Glucose: 108.28 mg/dL

## 2024-05-05 LAB — HIV ANTIBODY (ROUTINE TESTING W REFLEX): HIV Screen 4th Generation wRfx: NONREACTIVE

## 2024-05-05 LAB — TSH: TSH: 0.834 u[IU]/mL (ref 0.350–4.500)

## 2024-05-05 LAB — T4, FREE: Free T4: 1.18 ng/dL (ref 0.80–2.00)

## 2024-05-05 LAB — SEDIMENTATION RATE: Sed Rate: 3 mm/h (ref 0–22)

## 2024-05-05 MED ORDER — TIZANIDINE HCL 2 MG PO TABS: 2.0000 mg | ORAL_TABLET | Freq: Four times a day (QID) | ORAL | 0 refills | Status: AC | PRN

## 2024-05-05 MED ORDER — HYDROMORPHONE HCL 1 MG/ML IJ SOLN
1.0000 mg | Freq: Once | INTRAMUSCULAR | Status: AC
  Administered 2024-05-05: 1 mg via INTRAVENOUS
  Filled 2024-05-05: qty 1

## 2024-05-05 MED ORDER — ACETAMINOPHEN 325 MG PO TABS
650.0000 mg | ORAL_TABLET | Freq: Four times a day (QID) | ORAL | Status: DC | PRN
  Administered 2024-05-06: 650 mg via ORAL
  Filled 2024-05-05: qty 2

## 2024-05-05 MED ORDER — GADOBUTROL 1 MMOL/ML IV SOLN
10.0000 mL | Freq: Once | INTRAVENOUS | Status: AC | PRN
  Administered 2024-05-05: 10 mL via INTRAVENOUS

## 2024-05-05 MED ORDER — METHYLPREDNISOLONE SODIUM SUCC 125 MG IJ SOLR
40.0000 mg | Freq: Once | INTRAMUSCULAR | Status: AC
  Administered 2024-05-05: 40 mg via INTRAVENOUS
  Filled 2024-05-05: qty 2

## 2024-05-05 MED ORDER — LACTATED RINGERS IV SOLN: INTRAVENOUS | Status: DC

## 2024-05-05 MED ORDER — KETOROLAC TROMETHAMINE 30 MG/ML IJ SOLN
30.0000 mg | Freq: Once | INTRAMUSCULAR | Status: AC
  Administered 2024-05-05: 30 mg via INTRAVENOUS
  Filled 2024-05-05: qty 1

## 2024-05-05 MED ORDER — TIZANIDINE HCL 2 MG PO TABS
2.0000 mg | ORAL_TABLET | Freq: Four times a day (QID) | ORAL | Status: DC | PRN
  Filled 2024-05-05: qty 2

## 2024-05-05 MED ORDER — PANTOPRAZOLE SODIUM 40 MG IV SOLR
40.0000 mg | Freq: Two times a day (BID) | INTRAVENOUS | Status: DC
  Administered 2024-05-05 – 2024-05-06 (×2): 40 mg via INTRAVENOUS
  Filled 2024-05-05 (×2): qty 10

## 2024-05-05 MED ORDER — LACTATED RINGERS IV BOLUS
250.0000 mL | Freq: Once | INTRAVENOUS | Status: AC
  Administered 2024-05-05: 250 mL via INTRAVENOUS

## 2024-05-05 MED ORDER — ACETAMINOPHEN 650 MG RE SUPP: 650.0000 mg | Freq: Four times a day (QID) | RECTAL | Status: DC | PRN

## 2024-05-05 MED ORDER — SODIUM CHLORIDE 0.9% FLUSH
3.0000 mL | Freq: Two times a day (BID) | INTRAVENOUS | Status: DC
  Administered 2024-05-05 – 2024-05-06 (×2): 3 mL via INTRAVENOUS

## 2024-05-05 MED ORDER — MUSCLE RUB 10-15 % EX CREA
1.0000 | TOPICAL_CREAM | CUTANEOUS | Status: DC | PRN
  Administered 2024-05-06: 1 via TOPICAL
  Filled 2024-05-05: qty 85

## 2024-05-05 MED ORDER — HYDROMORPHONE HCL 1 MG/ML IJ SOLN
0.5000 mg | Freq: Once | INTRAMUSCULAR | Status: AC
  Administered 2024-05-05: 0.5 mg via INTRAVENOUS
  Filled 2024-05-05: qty 1

## 2024-05-05 MED ORDER — LIDOCAINE HCL 4 % EX CREA: 1.0000 | TOPICAL_CREAM | Freq: Every day | CUTANEOUS | 1 refills | Status: AC | PRN

## 2024-05-05 NOTE — Consult Note (Signed)
 NEUROLOGY CONSULT NOTE   Date of service: May 05, 2024 Patient Name: Carolyn Cole MRN:  [id number] DOB:  Sep 18, 1990 Chief Complaint: Headache Requesting Provider: Lane Cole *  History of Present Illness  Carolyn Cole is a 34 y.o. female with hx of anxiety, post partum pre eclampsia, concussion, fibroids who is s/p c section on 1/2. She developed a headache on 1/9 and saw her OB on 1/15 to r/o pre eclampsia. She has tried excedrin and Flexeril  without relief.  Her OB changed her to sumatriptan yesterday which has not helped.  Neck stiffness switched from right side to left side. She reports the headaches are worse when she is sleeping and typically wake her up from sleep. She was standing yesterday when she had an episode of thumping, dizziness, light sensitivity and has tried sleeping sitting up.   She is currently reporting the pain as an 11 out of 10 and worse while she is sleeping.  She is having consistent neck pain behind her ear and radiating to the top of her head. She does have tenderness with palpation over the neck/trapezius muscles. Headache does not change with position changes. Her headaches did start prior to her neck pain.   In December 2021 she followed with Dr. Margaret at [facility] for headache and dizziness.  On a dilated retinal exam she was found to have bilateral papilledema.  The initial plan was to check an MRI brain and then consider an LP.  Her MRI brain was normal in 2022 and the plan was for her to follow-up with optometry and proceed with an LP if her papilledema was worsening as well as starting acetazolamide. She reports her current symptoms are very different from the previous episode.     ROS  Comprehensive ROS performed and pertinent positives documented in HPI   Past History   Past Medical History:  Diagnosis Date   Anemia    Hx. of iron deficiency   Anxiety    Concussion 04/13/2010   Fibroid    Fibroids    Heart murmur    As an infant  and it resolved   History of postpartum pre-eclampsia    Papilledema, both eyes    Vertigo     Past Surgical History:  Procedure Laterality Date   APPENDECTOMY  2019   CESAREAN SECTION N/A 04/14/2024   Procedure: CESAREAN DELIVERY;  Surgeon: Carolyn Rubie LABOR, Carolyn Cole;  Location: [facility] LD [facility]S;  Service: Obstetrics;  Laterality: N/A;   MYOMECTOMY N/A 11/11/2022   Procedure: ABDOMINAL MYOMECTOMY;  Surgeon: Carolyn Crutch, Carolyn Cole;  Location: [facility] OR;  Service: Gynecology;  Laterality: N/A;   VAGINAL DELIVERY  01/29/2021    Family History: Family History  Problem Relation Age of Onset   Thyroid  disease Mother    Hypertension Mother    Hypertension Father    Ulcers Father    Gout Paternal Grandmother    Chiari malformation Sister     Social History  reports that she has never smoked. She has never used smokeless tobacco. She reports that she does not currently use alcohol. She reports that she does not use drugs.  Allergies[1]  Medications  Current Medications[2]  Vitals   Vitals:   05/05/24 1040  BP: 119/83  Pulse: 69  Resp: 18  Temp: 98.7 F (37.1 C)    There is no height or weight on file to calculate BMI.   Physical Exam   Constitutional: Appears well-developed and well-nourished.  Psych: Affect appropriate to situation.  Eyes: No scleral  injection.  HENT: No OP obstruction.  Head: Normocephalic.  Cardiovascular: Normal rate and regular rhythm.  Respiratory: Effort normal, non-labored breathing.  GI: Soft.  No distension. There is no tenderness.  Skin: WDI.   Neurologic Examination   Neuro: Mental Status: Patient is awake, alert, oriented to person, place, month, year, and situation. Patient is able to give a clear and coherent history. No signs of aphasia or neglect Cranial Nerves: II: Visual Fields are full. Pupils are equal, round, and reactive to light.   III,IV, VI: EOMI without ptosis or diploplia.  V: Facial sensation is symmetric to temperature VII:  Facial movement is symmetric resting and smiling VIII: Hearing is intact to voice X: Palate elevates symmetrically XI: Shoulder shrug is symmetric. XII: Tongue protrudes midline without atrophy or fasciculations.  Motor: Tone is normal. Bulk is normal. 5/5 strength was present in bilateral upper extremities  4/5 in bilateral hips 5/5 in remainder of legs  Sensory: Sensation is symmetric to light touch and temperature in the arms and legs. No extinction to DSS present.  Cerebellar: FNF and HKS are intact bilaterally   Labs/Imaging/Neurodiagnostic studies   CBC:  Recent Labs  Lab 05/10/2024 0954  WBC 3.7*  NEUTROABS 2.0  HGB 13.3  HCT 39.8  MCV 94.3  PLT 270   Basic Metabolic Panel:  Lab Results  Component Value Date   NA 140 05/10/24   K 4.6 05/10/24   CO2 27 05/10/24   GLUCOSE 86 05-10-24   BUN 10 05/10/24   CREATININE 0.83 10-May-2024   CALCIUM 10.2 05/10/24   GFRNONAA >60 May 10, 2024   Lipid Panel: No results found for: LDLCALC HgbA1c:  Lab Results  Component Value Date   HGBA1C 5.0 07/28/2022   Urine Drug Screen: No results found for: LABOPIA, COCAINSCRNUR, LABBENZ, AMPHETMU, THCU, LABBARB  Alcohol Level No results found for: ETH INR No results found for: INR APTT No results found for: APTT AED levels: No results found for: PHENYTOIN, ZONISAMIDE, LAMOTRIGINE, LEVETIRACETA  MRI Brain and MRV: Severe narrowing of the bilateral transverse sinuses near the transverse-sigmoid junction, with associated in-plane flow artifact on postcontrast images. Taken together with partially empty sella, these findings are suggestive of idiopathic intracranial hypertension.  ASSESSMENT   Carolyn Cole is a 34 y.o. female presenting with recurrent headaches and neck stiffness. She is 3 weeks post partum. She is currently breast feeding pumping. She is pumping or breast feeding every 3-5 hours. Exam consistent with cervical myofascial pain with  occipital radiation. Her headaches do not have the typical presentation of IIH. Recommend outpatient follow up with Dr. Margaret as well.   RECOMMENDATIONS  - Recommend solu-medrol  or decadron  x1 - Tizanidine  2-4mg  PRN - Topical menthol  - Gentle stretching, heat, and ice - Follow up with Dr Margaret  ______________________________________________________________________    Signed, Carolyn Last, Carolyn Cole [facility]  NEUROHOSPITALIST ADDENDUM Performed a face to face diagnostic evaluation.   I have reviewed the contents of history and physical exam as documented by PA/ARNP/Resident and agree with above documentation.  I have discussed and formulated the above plan as documented. Carolyn Cole.  Impression/Key exam findings/Plan: no focal deficits on exam. Headache seems to be neck pain with intermittent radiation into her head. She has clear trapezius spasm and tenderness to palpation, specially on the left side of her neck. Presentation seems concerning for cervical myofascial pain syndrome. She feels sitting up, leaning forward to breast feed, might be contributing to this but not  sure.  She wants to stay overnight since the headache has been particularly bothersome for her.  Agree with heating/ice pads depending on her preference, tpocial ben gay, tizanidine  along with decadron  x 1 and neck PT.  Despite MRI findings, she denies waking up with headache, no worsening with cough, sneeze or bearing down. Headache more suggestive of occipital neuralgia with migrainous features rather than headache with features of high pressure. No blurred/tunneled vision. I think LP would be low yield despite noted MR Venogram findings with BL transerve/sigmoid sinus stenosis.  Plan discussed with MAU Carolyn Cole Carolyn Cole and with Carolyn Cole over secure chat and conference call. Plan was discussed with patient at the bedside.  I personally spent a total of 75 minutes  in the care of the patient today including preparing to see the patient, getting/reviewing separately obtained history, performing a medically appropriate exam/evaluation, counseling and educating, placing orders, referring and communicating with other health care professionals, documenting clinical information in the EHR, independently interpreting results, communicating results, and coordinating care.   Carolyn Parcell, Carolyn Cole [facility] [phone]   If 7pm to 7am, please call on call as listed on AMION.      [1]  Allergies Allergen Reactions   Oxycodone  Hcl Other (See Comments)    Felt out of it  [2] No current facility-administered medications for this encounter.

## 2024-05-05 NOTE — MAU Provider Note (Signed)
 " History     CSN: [id number]  Arrival date and time: 05/05/24 9097   Event Date/Time   First Provider Initiated Contact with Patient 05/05/24 1143      Chief Complaint  Patient presents with   Headache   HPI  Ms.Carolyn Cole is a 34 y.o. female G2P2002 status post scheduled C/S on 04/14/2024 presenting to MAU with reports of HA and neck pain which started on 04/21/2024.  The pain Started on the right side of her neck right behind her ear and radiated to the top of her head. The pain would wake her up during sleep. No history of migraines. The pain is so bad that is takes her breath away. She reports dizziness and light sensitivity at the time of pain. The pain feels like a throbbing pain.   The pain has now radiated to the left side of her neck and behind her left ear. The pain has continued to remain thumbing. No position changes have made the pain less or worse.   She saw her OB on 1/15 and ruled out preeclampsia. She tried Excedrin and flexeril  and no relief. She saw  OB yesterday for follow up and they recommended sumatriptin which has not helped all. When the pain comes she rates the pain 11/10. The pain occurs more while she is sleeping, but yesterday she felt the pain while she was standing which was new for her.   The neck pain is constant. The headache comes and goes. She is more concerned about the HA as this pain is debilitating. The neck pain is tolerable.   Of note, The Patient went to optometrist in October 2021 for evaluation and was found to have bilateral papilledema on dilated retinal exam and OCT testing.   Overall symptoms have improved since Oct 2021.  Last DG lumbar spine was without abnormalities  Last MR of brain in 2022 was normal.   OB History     Gravida  2   Para  2   Term  2   Preterm      AB      Living  2      SAB      IAB      Ectopic      Multiple  0   Live Births  2           Past Medical History:  Diagnosis Date   Anemia     Hx. of iron deficiency   Anxiety    Concussion 04/13/2010   Fibroid    Fibroids    Heart murmur    As an infant and it resolved   History of postpartum pre-eclampsia    Papilledema, both eyes    Vertigo     Past Surgical History:  Procedure Laterality Date   APPENDECTOMY  2019   CESAREAN SECTION N/A 04/14/2024   Procedure: CESAREAN DELIVERY;  Surgeon: Claire Rubie LABOR, MD;  Location: [facility] LD ORS;  Service: Obstetrics;  Laterality: N/A;   MYOMECTOMY N/A 11/11/2022   Procedure: ABDOMINAL MYOMECTOMY;  Surgeon: Jeralyn Crutch, MD;  Location: [facility] OR;  Service: Gynecology;  Laterality: N/A;   VAGINAL DELIVERY  01/29/2021    Family History  Problem Relation Age of Onset   Thyroid  disease Mother    Hypertension Mother    Hypertension Father    Ulcers Father    Gout Paternal Grandmother    Chiari malformation Sister     Social History[1]  Allergies: Allergies[2]  Medications Prior to  Admission  Medication Sig Dispense Refill Last Dose/Taking   SUMAtriptan (IMITREX) 25 MG tablet Take 50 mg by mouth every 2 (two) hours as needed for migraine. May repeat in 2 hours if headache persists or recurs.   Taking As Needed   ibuprofen  (ADVIL ) 800 MG tablet Take 1 tablet (800 mg total) by mouth every 8 (eight) hours as needed. 60 tablet 1    oxyCODONE  (OXY IR/ROXICODONE ) 5 MG immediate release tablet Take 1 tablet (5 mg total) by mouth every 6 (six) hours as needed for severe pain (pain score 7-10). 10 tablet 0    Prenatal MV-Min-Fe Fum-FA-DHA (PRENATAL 1 PO) Take 1 tablet by mouth daily.      Results for orders placed or performed during the hospital encounter of 05/05/24 (from the past 48 hours)  CBC with Differential/Platelet     Status: Abnormal   Collection Time: 05/05/24  9:54 AM  Result Value Ref Range   WBC 3.7 (L) 4.0 - 10.5 K/uL   RBC 4.22 3.87 - 5.11 MIL/uL   Hemoglobin 13.3 12.0 - 15.0 g/dL   HCT 60.1 63.9 - 53.9 %   MCV 94.3 80.0 - 100.0 fL   MCH 31.5 26.0 - 34.0 pg    MCHC 33.4 30.0 - 36.0 g/dL   RDW 86.1 88.4 - 84.4 %   Platelets 270 150 - 400 K/uL   nRBC 0.0 0.0 - 0.2 %   Neutrophils Relative % 56 %   Neutro Abs 2.0 1.7 - 7.7 K/uL   Lymphocytes Relative 26 %   Lymphs Abs 0.9 0.7 - 4.0 K/uL   Monocytes Relative 8 %   Monocytes Absolute 0.3 0.1 - 1.0 K/uL   Eosinophils Relative 9 %   Eosinophils Absolute 0.3 0.0 - 0.5 K/uL   Basophils Relative 1 %   Basophils Absolute 0.0 0.0 - 0.1 K/uL   Immature Granulocytes 0 %   Abs Immature Granulocytes 0.01 0.00 - 0.07 K/uL    Comment: Performed at [facility], [address], [place], [place] [address]  Comprehensive metabolic panel     Status: None   Collection Time: 05/05/24  9:54 AM  Result Value Ref Range   Sodium 140 135 - 145 mmol/L   Potassium 4.6 3.5 - 5.1 mmol/L   Chloride 104 98 - 111 mmol/L   CO2 27 22 - 32 mmol/L   Glucose, Bld 86 70 - 99 mg/dL    Comment: Glucose reference range applies only to samples taken after fasting for at least 8 hours.   BUN 10 6 - 20 mg/dL   Creatinine, Ser 9.16 0.44 - 1.00 mg/dL   Calcium 89.7 8.9 - 89.6 mg/dL   Total Protein 7.0 6.5 - 8.1 g/dL   Albumin 4.7 3.5 - 5.0 g/dL   AST 30 15 - 41 U/L    Comment: HEMOLYSIS AT THIS LEVEL MAY AFFECT RESULT   ALT 34 0 - 44 U/L   Alkaline Phosphatase 78 38 - 126 U/L   Total Bilirubin 0.4 0.0 - 1.2 mg/dL   GFR, Estimated >39 >39 mL/min    Comment: (NOTE) Calculated using the CKD-EPI Creatinine Equation (2021)    Anion gap 10 5 - 15    Comment: Performed at [facility], [address] [place], [place] [address]    MR BRAIN W WO CONTRAST Result Date: 05/05/2024 EXAM: MRI BRAIN WITH AND WITHOUT CONTRAST MR VENOGRAM HEAD WITH AND WITHOUT CONTRAST 05/05/2024 02:47:46 PM TECHNIQUE: Multiplanar multisequence MRI of  the brain was performed with and without the administration of intravenous contrast. MR venogram of the head was performed with and without the administration of contrast. CONTRAST: 10 mL Gadavist .  COMPARISON: MRI brain 04/01/2021. CLINICAL HISTORY: Headache, increasing frequency or severity. Headache with increasing frequency or severity. FINDINGS: BRAIN AND VENTRICLES: No acute infarct. No acute intracranial hemorrhage. No mass. No midline shift. No hydrocephalus. Partially empty sella. Normal flow voids. ORBITS: No significant abnormality. SINUSES AND MASTOIDS: No significant abnormality. BONES AND SOFT TISSUES: Normal bone marrow signal. No soft tissue abnormality. VENOGRAM: Severe narrowing of the bilateral transverse sinuses near the transverse-sigmoid junction. Peripheral signal void on axial postcontrast images is favored to reflect in-plane flow artifact due to turbulent flow from severe stenosis. IMPRESSION: 1. Severe narrowing of the bilateral transverse sinuses near the transverse-sigmoid junction, with associated in-plane flow artifact on postcontrast images. Taken together with partially empty sella, these findings are suggestive of idiopathic intracranial hypertension. Electronically signed by: Ryan Chess MD 05/05/2024 03:23 PM EST RP Workstation: [id number]   MR MRV HEAD W WO CONTRAST Result Date: 05/05/2024 EXAM: MRI BRAIN WITH AND WITHOUT CONTRAST MR VENOGRAM HEAD WITH AND WITHOUT CONTRAST 05/05/2024 02:47:46 PM TECHNIQUE: Multiplanar multisequence MRI of the brain was performed with and without the administration of intravenous contrast. MR venogram of the head was performed with and without the administration of contrast. CONTRAST: 10 mL Gadavist . COMPARISON: MRI brain 04/01/2021. CLINICAL HISTORY: Headache, increasing frequency or severity. Headache with increasing frequency or severity. FINDINGS: BRAIN AND VENTRICLES: No acute infarct. No acute intracranial hemorrhage. No mass. No midline shift. No hydrocephalus. Partially empty sella. Normal flow voids. ORBITS: No significant abnormality. SINUSES AND MASTOIDS: No significant abnormality. BONES AND SOFT TISSUES: Normal bone marrow  signal. No soft tissue abnormality. VENOGRAM: Severe narrowing of the bilateral transverse sinuses near the transverse-sigmoid junction. Peripheral signal void on axial postcontrast images is favored to reflect in-plane flow artifact due to turbulent flow from severe stenosis. IMPRESSION: 1. Severe narrowing of the bilateral transverse sinuses near the transverse-sigmoid junction, with associated in-plane flow artifact on postcontrast images. Taken together with partially empty sella, these findings are suggestive of idiopathic intracranial hypertension. Electronically signed by: Ryan Chess MD 05/05/2024 03:23 PM EST RP Workstation: [id number]     Review of Systems  Constitutional:  Negative for fever.  Eyes:  Positive for photophobia and visual disturbance.  Musculoskeletal:  Positive for neck pain and neck stiffness.   Physical Exam   Blood pressure 119/83, pulse 69, temperature 98.7 F (37.1 C), resp. rate 18, unknown if currently breastfeeding.  Physical Exam Constitutional:      General: She is not in acute distress.    Appearance: She is well-developed. She is not ill-appearing, toxic-appearing or diaphoretic.  Pulmonary:     Effort: Pulmonary effort is normal.  Musculoskeletal:     Right lower leg: No edema.     Left lower leg: No edema.  Neurological:     Mental Status: She is alert and oriented to person, place, and time.     Cranial Nerves: No facial asymmetry.     Gait: Gait normal.     Deep Tendon Reflexes: Reflexes normal.  Psychiatric:        Mood and Affect: Mood normal.     MAU Course  Procedures  MDM  Spoke to neurologist at 1205 Dr. Voncile and discussed patient presentation and complaints.  Dr. Voncile recommends MRI and MRV; recommended the patient have stat imaging.   MRI and MRV results show  Severe narrowing of the bilateral transverse sinuses near the transverse-sigmoid junction, with associated in-plane flow artifact on postcontrast images. Taken  together with partially empty sella, these findings are suggestive of idiopathic intracranial hypertension.  1545 Discussed findings of MRI and MRV with Dr. Voncile who recommends I call and speak to radiology PA Charlie to get LP scheduled.  1600: spoke to radiology PA Carlin to arrange LP at the request of Dr. Voncile (neurology)  1600: Requested Charlie PA and Dr. Voncile to talk directly as there was concern regarding policy for bedside LP vs LP with IR.  1600: Spoke to Dr. Voncile who recommended discussion [facility]. Reviewed admission with OB on call Dr. Lane who also agrees with discussing case with [facility] vs admitting patient under OB care.  1625: Dr. Tobie to MAU to see the patient and discuss plan of care. Admission orders placed and a bed was requested through house coverage.   1640: Neuro team down to MAU to see patient. Upon leaving the MAU the neuro team recommends Outpatient management with [place], muscle relaxer's and a dose of steroids prior to DC. Neuro team does not recommend In patient management or LP, does recommend outpatient management with neurology. 1800: Discussed concerns with DC with Dr Tobie who also agree's patient should stay for admission for further imaging and and labs. 1815: patient is worried and is agreeable to stay for further workup. 1820: patient requests further pain medication while in MAU due to recurring pain.  1825: Dr. Lane updated on admission and plan of care.    Assessment and Plan   A:  1. Cervical myofascial strain, initial encounter   2. Postpartum state   3. Neck pain   4. Acute intractable headache, unspecified headache type      P:  Admit under [facility]  Vallen Calabrese, Delon FERNS, NP 05/05/2024 6:42 PM      [1]  Social History Tobacco Use   Smoking status: Never   Smokeless tobacco: Never  Vaping Use   Vaping status: Never Used  Substance Use Topics   Alcohol use:  Not Currently    Comment: rarely   Drug use: Never  [2]  Allergies Allergen Reactions   Oxycodone  Hcl Other (See Comments)    Felt out of it   "

## 2024-05-05 NOTE — H&P (Addendum)
 " History and Physical    Patient: Carolyn Cole FMW:[id number] DOB: 09-28-1990 DOA: 05/05/2024 DOS: the patient was seen and examined on 05/05/2024 . PCP: Vernon Velna SAUNDERS, MD  Patient coming from: Maternity OB unit.  Chief complaint: Chief Complaint  Patient presents with   Headache   HPI:  Carolyn Cole is a 34 y.o. female with past medical history  of anemia, history of fibroids, history of papilledema, comes to maternity unit 2 weeks after delivering her second child which was a vaginal birth uncomplicated no preeclampsia patient is currently breast-feeding.  Patient states that 1 week postpartum she started to have pain behind her right ear and that was a like lightning and it moved to her right side of the head in the temporal region and this kept happening every time she would try to lay down and she took her migraine medicine that she was given which did not help even with that she noticed that she had an episode of the same pain along with headache and nausea while standing up and then she also noticed that the pain that was on the right side is now also on the left side.  No numbness incontinence weakness or any other paresthesia.  No blurred vision.  No speech or gait difficulty.  OB provider Delon Emms, contacted hospitalist admitting to admit patient on medical service so patient can have evaluation with neurology and appropriate medical care.  Patient did have neuroimaging today consistent with MRI and MRV which showed findings concerning for idiopathic intracranial hypertension.  Neurology has also been consulted and recommend lumbar puncture.   ED Course:  Vital signs in the ED were notable for the following:  Vitals:   05/05/24 1040  BP: 119/83  Pulse: 69  Temp: 98.7 F (37.1 C)  Resp: 18   >>ED evaluation thus far shows: CMP is within normal limits. CBC shows white count of 3.7 otherwise normal hemoglobin of 13.3 platelets of 270.  >>While in the ED patient received  the following: Medications  lactated ringers  bolus 250 mL (0 mLs Intravenous Stopped 05/05/24 1344)  HYDROmorphone  (DILAUDID ) injection 1 mg (1 mg Intravenous Given 05/05/24 1231)  ketorolac  (TORADOL ) 30 MG/ML injection 30 mg (30 mg Intravenous Given 05/05/24 1232)  gadobutrol  (GADAVIST ) 1 MMOL/ML injection 10 mL (10 mLs Intravenous Contrast Given 05/05/24 1452)   Review of Systems  Musculoskeletal:  Positive for neck pain.  Neurological:  Positive for dizziness and headaches.   Past Medical History:  Diagnosis Date   Anemia    Hx. of iron deficiency   Anxiety    Concussion 04/13/2010   Fibroid    Fibroids    Heart murmur    As an infant and it resolved   History of postpartum pre-eclampsia    Papilledema, both eyes    Vertigo    Past Surgical History:  Procedure Laterality Date   APPENDECTOMY  2019   CESAREAN SECTION N/A 04/14/2024   Procedure: CESAREAN DELIVERY;  Surgeon: Claire Rubie LABOR, MD;  Location: [facility] ORS;  Service: Obstetrics;  Laterality: N/A;   MYOMECTOMY N/A 11/11/2022   Procedure: ABDOMINAL MYOMECTOMY;  Surgeon: Jeralyn Crutch, MD;  Location: [facility] OR;  Service: Gynecology;  Laterality: N/A;   VAGINAL DELIVERY  01/29/2021    reports that she has never smoked. She has never used smokeless tobacco. She reports that she does not currently use alcohol. She reports that she does not use drugs. Allergies[1] Family History  Problem Relation Age of Onset  Thyroid  disease Mother    Hypertension Mother    Hypertension Father    Ulcers Father    Gout Paternal Grandmother    Chiari malformation Sister    Prior to Admission medications  Medication Sig Start Date End Date Taking? Authorizing Provider  SUMAtriptan (IMITREX) 25 MG tablet Take 50 mg by mouth every 2 (two) hours as needed for migraine. May repeat in 2 hours if headache persists or recurs.   Yes [provider]  ibuprofen  (ADVIL ) 800 MG tablet Take 1 tablet (800 mg total) by mouth every 8 (eight)  hours as needed. 04/16/24   Shivaji, Lavonia HERO, MD  oxyCODONE  (OXY IR/ROXICODONE ) 5 MG immediate release tablet Take 1 tablet (5 mg total) by mouth every 6 (six) hours as needed for severe pain (pain score 7-10). 04/16/24   Shivaji, Lavonia HERO, MD  Prenatal MV-Min-Fe Fum-FA-DHA (PRENATAL 1 PO) Take 1 tablet by mouth daily.    [provider]                                                                                 Vitals:   05/05/24 1040  BP: 119/83  Pulse: 69  Resp: 18  Temp: 98.7 F (37.1 C)   Physical Exam Vitals reviewed.  Constitutional:      General: She is not in acute distress.    Appearance: She is not ill-appearing.  HENT:     Head: Normocephalic.   Eyes:     Extraocular Movements: Extraocular movements intact.  Cardiovascular:     Rate and Rhythm: Normal rate and regular rhythm.     Pulses: Normal pulses.     Heart sounds: Normal heart sounds.  Pulmonary:     Effort: Pulmonary effort is normal.     Breath sounds: Normal breath sounds.  Abdominal:     General: There is no distension.     Palpations: Abdomen is soft.     Tenderness: There is no abdominal tenderness.  Musculoskeletal:     Right lower leg: No edema.     Left lower leg: No edema.  Neurological:     General: No focal deficit present.     Mental Status: She is alert and oriented to person, place, and time.     Cranial Nerves: No cranial nerve deficit, dysarthria or facial asymmetry.     Motor: No weakness.     Coordination: Finger-Nose-Finger Test normal.     Deep Tendon Reflexes:     Reflex Scores:      Bicep reflexes are 2+ on the right side and 2+ on the left side.      Patellar reflexes are 2+ on the right side and 2+ on the left side.    Labs on Admission: I have personally reviewed following labs and imaging studies CBC: Recent Labs  Lab 05/05/24 0954  WBC 3.7*  NEUTROABS 2.0  HGB 13.3  HCT 39.8  MCV 94.3  PLT 270   Basic Metabolic Panel: Recent Labs  Lab 05/05/24 0954   NA 140  K 4.6  CL 104  CO2 27  GLUCOSE 86  BUN 10  CREATININE 0.83  CALCIUM 10.2   GFR: CrCl cannot  be calculated (Unknown ideal weight.). Liver Function Tests: Recent Labs  Lab 05/05/24 0954  AST 30  ALT 34  ALKPHOS 78  BILITOT 0.4  PROT 7.0  ALBUMIN 4.7   No results for input(s): LIPASE, AMYLASE in the last 168 hours. No results for input(s): AMMONIA in the last 168 hours. Recent Labs    04/14/24 1558 05/05/24 0954  BUN  --  10  CREATININE 0.56 0.83    Cardiac Enzymes: No results for input(s): CKTOTAL, CKMB, CKMBINDEX, TROPONINI in the last 168 hours. BNP (last 3 results) No results for input(s): PROBNP in the last 8760 hours. HbA1C: No results for input(s): HGBA1C in the last 72 hours. CBG: No results for input(s): GLUCAP in the last 168 hours. Lipid Profile: No results for input(s): CHOL, HDL, LDLCALC, TRIG, CHOLHDL, LDLDIRECT in the last 72 hours. Thyroid  Function Tests: No results for input(s): TSH, T4TOTAL, FREET4, T3FREE, THYROIDAB in the last 72 hours. Anemia Panel: No results for input(s): VITAMINB12, FOLATE, FERRITIN, TIBC, IRON, RETICCTPCT in the last 72 hours. Urine analysis:    Component Value Date/Time   COLORURINE YELLOW 03/04/2024 1841   APPEARANCEUR CLEAR 03/04/2024 1841   LABSPEC 1.017 03/04/2024 1841   PHURINE 6.0 03/04/2024 1841   GLUCOSEU NEGATIVE 03/04/2024 1841   HGBUR SMALL (A) 03/04/2024 1841   BILIRUBINUR NEGATIVE 03/04/2024 1841   KETONESUR NEGATIVE 03/04/2024 1841   PROTEINUR NEGATIVE 03/04/2024 1841   NITRITE NEGATIVE 03/04/2024 1841   LEUKOCYTESUR NEGATIVE 03/04/2024 1841   Radiological Exams on Admission: MR BRAIN W WO CONTRAST Result Date: 05/05/2024 EXAM: MRI BRAIN WITH AND WITHOUT CONTRAST MR VENOGRAM HEAD WITH AND WITHOUT CONTRAST 05/05/2024 02:47:46 PM TECHNIQUE: Multiplanar multisequence MRI of the brain was performed with and without the administration of  intravenous contrast. MR venogram of the head was performed with and without the administration of contrast. CONTRAST: 10 mL Gadavist . COMPARISON: MRI brain 04/01/2021. CLINICAL HISTORY: Headache, increasing frequency or severity. Headache with increasing frequency or severity. FINDINGS: BRAIN AND VENTRICLES: No acute infarct. No acute intracranial hemorrhage. No mass. No midline shift. No hydrocephalus. Partially empty sella. Normal flow voids. ORBITS: No significant abnormality. SINUSES AND MASTOIDS: No significant abnormality. BONES AND SOFT TISSUES: Normal bone marrow signal. No soft tissue abnormality. VENOGRAM: Severe narrowing of the bilateral transverse sinuses near the transverse-sigmoid junction. Peripheral signal void on axial postcontrast images is favored to reflect in-plane flow artifact due to turbulent flow from severe stenosis. IMPRESSION: 1. Severe narrowing of the bilateral transverse sinuses near the transverse-sigmoid junction, with associated in-plane flow artifact on postcontrast images. Taken together with partially empty sella, these findings are suggestive of idiopathic intracranial hypertension. Electronically signed by: Ryan Chess MD 05/05/2024 03:23 PM EST RP Workstation: [id number]   MR MRV HEAD W WO CONTRAST Result Date: 05/05/2024 EXAM: MRI BRAIN WITH AND WITHOUT CONTRAST MR VENOGRAM HEAD WITH AND WITHOUT CONTRAST 05/05/2024 02:47:46 PM TECHNIQUE: Multiplanar multisequence MRI of the brain was performed with and without the administration of intravenous contrast. MR venogram of the head was performed with and without the administration of contrast. CONTRAST: 10 mL Gadavist . COMPARISON: MRI brain 04/01/2021. CLINICAL HISTORY: Headache, increasing frequency or severity. Headache with increasing frequency or severity. FINDINGS: BRAIN AND VENTRICLES: No acute infarct. No acute intracranial hemorrhage. No mass. No midline shift. No hydrocephalus. Partially empty sella. Normal flow  voids. ORBITS: No significant abnormality. SINUSES AND MASTOIDS: No significant abnormality. BONES AND SOFT TISSUES: Normal bone marrow signal. No soft tissue abnormality. VENOGRAM: Severe narrowing of the bilateral transverse sinuses  near the transverse-sigmoid junction. Peripheral signal void on axial postcontrast images is favored to reflect in-plane flow artifact due to turbulent flow from severe stenosis. IMPRESSION: 1. Severe narrowing of the bilateral transverse sinuses near the transverse-sigmoid junction, with associated in-plane flow artifact on postcontrast images. Taken together with partially empty sella, these findings are suggestive of idiopathic intracranial hypertension. Electronically signed by: Ryan Chess MD 05/05/2024 03:23 PM EST RP Workstation: [id number]   Data Reviewed: Relevant notes from primary care and specialist visits, past discharge summaries as available in EHR, including Care Everywhere . Prior diagnostic testing as pertinent to current admission diagnoses, Updated medications and problem lists for reconciliation .ED course, including vitals, labs, imaging, treatment and response to treatment,Triage notes, nursing and pharmacy notes and ED provider's notes.Notable results as noted in HPI.Discussed case with EDMD/ ED APP/ or Specialty MD on call and as needed.  Assessment & Plan  >> Idiopathic intracranial hypertension: Patient presenting with headache dizziness imaging findings of bilateral transverse sinus stenosis and a partially empty sella consistent with elevated intracranial pressures confirming diagnosis of idiopathic intracranial hypertension.  Appreciate neurology consult and management. Patient is scheduled to have a lumbar puncture. Acetazolamide per neurology. Currently patient has no visual symptoms or blurred vision, ophthalmology consult as deemed appropriate. Avoid opioids and NSAIDs, Tylenol  for breakthrough pain. Blood pressure is within normal  limits and no concerns for preeclampsia prophy. We will also obtain C spine MRI as pt describes sharp pain that is positional.    >> History of asthma: As needed albuterol as  needed.    >> History of papilledema: Patient needs ophthalmology evaluation once stable.  Currently exam and vision is reassuring.    >> Postpartum 2 weeks: Patient is currently breast-feeding and is also pumping and saving so she may pump and dump in the event she needs a particular medication or it any meds that may be contraindicated.  DVT prophylaxis:  SCD's Consults:  Neurology.   Advance Care Planning:    Code Status: Full Code   Family Communication:  None.  Disposition Plan:  Home.  Severity of Illness: The appropriate patient status for this patient is OBSERVATION. Observation status is judged to be reasonable and necessary in order to provide the required intensity of service to ensure the patient's safety. The patient's presenting symptoms, physical exam findings, and initial radiographic and laboratory data in the context of their medical condition is felt to place them at decreased risk for further clinical deterioration. Furthermore, it is anticipated that the patient will be medically stable for discharge from the hospital within 2 midnights of admission.   Unresulted Labs (From admission, onward)     Start     Ordered   05/06/24 0500  Comprehensive metabolic panel  Tomorrow morning,   R        05/05/24 1714   05/06/24 0500  CBC  Tomorrow morning,   R        05/05/24 1714   05/05/24 1713  HIV Antibody (routine testing w rflx)  (HIV Antibody (Routine testing w reflex) panel)  Once,   R        05/05/24 1714   05/05/24 1655  Hemoglobin A1c  Add-on,   AD        05/05/24 1655   05/05/24 1655  T4, free  Add-on,   AD        05/05/24 1655   05/05/24 1655  TSH  Add-on,   AD  05/05/24 1655   05/05/24 1655  Sedimentation rate  Add-on,   AD        05/05/24 1655            Meds  ordered this encounter  Medications   lactated ringers  bolus 250 mL   HYDROmorphone  (DILAUDID ) injection 1 mg   ketorolac  (TORADOL ) 30 MG/ML injection 30 mg   gadobutrol  (GADAVIST ) 1 MMOL/ML injection 10 mL   sodium chloride  flush (NS) 0.9 % injection 3 mL   OR Linked Order Group    acetaminophen  (TYLENOL ) tablet 650 mg    acetaminophen  (TYLENOL ) suppository 650 mg   lactated ringers  infusion   tiZANidine  (ZANAFLEX ) 2 MG tablet    Sig: Take 1-2 tablets (2-4 mg total) by mouth every 6 (six) hours as needed for muscle spasms.    Dispense:  30 tablet    Refill:  0   Lidocaine  HCl (BENGAY LIDOCAINE ) 4 % CREA    Sig: Apply 1 Application topically daily as needed.    Dispense:  80 mL    Refill:  1   methylPREDNISolone  sodium succinate (SOLU-MEDROL ) 125 mg/2 mL injection 40 mg    IV methylprednisolone  will be converted to either a q12h or q24h frequency with the same total daily dose (TDD).  Ordered Dose: 1 to 125 mg TDD; convert to: TDD q24h.  Ordered Dose: 126 to 250 mg TDD; convert to: TDD div q12h.  Ordered Dose: >250 mg TDD; DAW.   HYDROmorphone  (DILAUDID ) injection 0.5 mg   ketorolac  (TORADOL ) 30 MG/ML injection 30 mg   pantoprazole  (PROTONIX ) injection 40 mg     Orders Placed This Encounter  Procedures   MR BRAIN W WO CONTRAST   MR MRV HEAD W WO CONTRAST   MR Cervical Spine Wo Contrast   CBC with Differential/Platelet   Comprehensive metabolic panel   Hemoglobin A1c   T4, free   TSH   Sedimentation rate   HIV Antibody (routine testing w rflx)   Comprehensive metabolic panel   CBC   Ambulatory referral to Neurology   Diet Heart Room service appropriate? Yes; Fluid consistency: Thin   Maintain IV access   Vital signs   Notify physician (specify)   Refer to Sidebar Report Mobility Protocol for Adult Inpatient   Initiate Adult Central Line Maintenance and Catheter Clearance Protocol for patients with central line (CVC, PICC, Port, Hemodialysis, Trialysis)   Daily weights    Intake and Output   Initiate CHG Protocol for patients in ICU/SD or any patient with a central line or foley catheter   Do not place and if present remove PureWick   Initiate Oral Care Protocol   Initiate Carrier Fluid Protocol   RN may order General Admission PRN Orders utilizing General Admission PRN medications (through manage orders) for the following patient needs: allergy symptoms (Claritin), cold sores (Carmex), cough (Robitussin DM), eye irritation (Liquifilm Tears), hemorrhoids (Tucks), indigestion (Maalox), minor skin irritation (Hydrocortisone  Cream), muscle pain (Ben Gay), nose irritation (saline nasal spray) and sore throat (Chloraseptic spray).   SCDs   Cardiac Monitoring Continuous x 48 hours Indications for use: Other; Other indications for use: IIH   Ambulate with assistance   Full code   Patient needs PT consult as soon as possible.  Discharge is pending PT consult.  Please call  PT eval and treat   Pulse oximetry check with vital signs   Oxygen therapy Mode or (Route): Nasal cannula; Liters Per Minute: 2; Keep O2 saturation between: greater than 92 %  EKG 12-Lead   Place in observation (patient's expected length of stay will be less than 2 midnights)   Aspiration precautions   Fall precautions   Author: Mario LULLA Blanch, MD 12 pm- 8 pm. [facility]. 05/05/2024 6:21 PM Please note for any communication after hours contact [facility] Assigned provider on call on Amion.     [1]  Allergies Allergen Reactions   Oxycodone  Hcl Other (See Comments)    Felt out of it   "

## 2024-05-05 NOTE — MAU Note (Signed)
 Carolyn Cole is a 33 y.o. at Unknown here in MAU reporting: PP c-section on 04/14/2024. Has been having headaches and neck aches since she has gone home. Has been seen by OB. B/P WNL and they have tried Excedrin , a muscle relaxer and Imitrex with very little relief. Photo sensitive with the pain as well. OB has done labs and she was told it was all WNL not anemic and no pre-e  LMP:  Onset of complaint: 2-3 weeks  Pain score: 4  There were no vitals filed for this visit.   FHT:   Lab orders placed from triage:

## 2024-05-06 ENCOUNTER — Observation Stay (HOSPITAL_COMMUNITY)

## 2024-05-06 DIAGNOSIS — M7918 Myalgia, other site: Secondary | ICD-10-CM | POA: Diagnosis not present

## 2024-05-06 LAB — COMPREHENSIVE METABOLIC PANEL WITH GFR
ALT: 29 U/L (ref 0–44)
AST: 24 U/L (ref 15–41)
Albumin: 4.5 g/dL (ref 3.5–5.0)
Alkaline Phosphatase: 67 U/L (ref 38–126)
Anion gap: 12 (ref 5–15)
BUN: 13 mg/dL (ref 6–20)
CO2: 22 mmol/L (ref 22–32)
Calcium: 9.9 mg/dL (ref 8.9–10.3)
Chloride: 104 mmol/L (ref 98–111)
Creatinine, Ser: 0.69 mg/dL (ref 0.44–1.00)
GFR, Estimated: >60 mL/min
Glucose, Bld: 126 mg/dL — ABNORMAL HIGH (ref 70–99)
Potassium: 4.2 mmol/L (ref 3.5–5.1)
Sodium: 138 mmol/L (ref 135–145)
Total Bilirubin: 0.4 mg/dL (ref 0.0–1.2)
Total Protein: 6.8 g/dL (ref 6.5–8.1)

## 2024-05-06 LAB — MENINGITIS/ENCEPHALITIS PANEL (CSF)

## 2024-05-06 LAB — CSF CELL COUNT WITH DIFFERENTIAL
RBC Count, CSF: 32 /cu mm — ABNORMAL HIGH
RBC Count, CSF: 7 /cu mm — ABNORMAL HIGH
Tube #: 1
Tube #: 4
WBC, CSF: 3 /cu mm (ref 0–5)
WBC, CSF: 3 /cu mm (ref 0–5)

## 2024-05-06 LAB — CBC
HCT: 39 % (ref 36.0–46.0)
Hemoglobin: 13 g/dL (ref 12.0–15.0)
MCH: 31.4 pg (ref 26.0–34.0)
MCHC: 33.3 g/dL (ref 30.0–36.0)
MCV: 94.2 fL (ref 80.0–100.0)
Platelets: 293 10*3/uL (ref 150–400)
RBC: 4.14 MIL/uL (ref 3.87–5.11)
RDW: 13.7 % (ref 11.5–15.5)
WBC: 8.8 10*3/uL (ref 4.0–10.5)
nRBC: 0 % (ref 0.0–0.2)

## 2024-05-06 LAB — PROTEIN AND GLUCOSE, CSF
Glucose, CSF: 82 mg/dL — ABNORMAL HIGH (ref 40–70)
Total  Protein, CSF: 31 mg/dL (ref 15–45)

## 2024-05-06 MED ORDER — NAPROXEN SODIUM 220 MG PO TABS: 220.0000 mg | ORAL_TABLET | Freq: Two times a day (BID) | ORAL | Status: AC | PRN

## 2024-05-06 MED ORDER — ACETAMINOPHEN 500 MG PO TABS: 1000.0000 mg | ORAL_TABLET | Freq: Three times a day (TID) | ORAL | Status: AC | PRN

## 2024-05-06 MED ORDER — LIDOCAINE 1 % OPTIME INJ - NO CHARGE
5.0000 mL | Freq: Once | INTRAMUSCULAR | Status: AC
  Administered 2024-05-06: 5 mL via INTRADERMAL
  Filled 2024-05-06: qty 6

## 2024-05-06 NOTE — Progress Notes (Signed)
 Pt for discharge home today.  Telemetry and IV discontinued.  AVS done by the discharge team.  General condition well,  slight improvement on pain on the left side of head and neck.  Vital signs unremarkable.  Still having some mild pain.  Awaiting on husband for transport home.

## 2024-05-06 NOTE — Evaluation (Signed)
 Physical Therapy Evaluation Patient Details Name: Carolyn Cole MRN: [id number] DOB: Aug 24, 1990 Today's Date: 05/06/2024  History of Present Illness  Pt is a 34 y/o female who presents 05/05/2024 with headache and neck pain. Of note pt is several weeks postpartum via c-section and currently breastfeeding/pumping. MRI revealed idiopathic intracranial HTN. MRI pending for C-Spine due to reported positional sharp pains. LP pending as well. PMH signficant for Concussion 2012, PP pre-eclampsia, vertigo, papilledema bilaterally, c-section 04/14/2024.   Clinical Impression  Pt admitted with above diagnosis. Pt currently with functional limitations due to the deficits listed below (see PT Problem List). At the time of PT eval pt was able to perform transfers and ambulation with gross modified independence. She reports mild symptoms that she at times states is dizziness and at other times states is not dizziness, but always with head turns. Vertical head turns elicit stronger symptoms than horizontal head turns, however pt able to maintain balance throughout these episodes while walking. Of note pt has a history of vertigo. Pt completed DGI and scored 20/24, indicating she is not at a high risk for falls. Do not anticipate she will require post-acute PT follow up, however recommend BSC and tub bench to allow for more independence with ADL's at d/c. Pt reports she has been relying on her partner due to pain s/p c-section. Pt will benefit from acute skilled PT to increase their independence and safety with mobility to allow discharge.           If plan is discharge home, recommend the following: A little help with bathing/dressing/bathroom;Assistance with cooking/housework;Assist for transportation;Help with stairs or ramp for entrance   Can travel by private vehicle        Equipment Recommendations BSC/3in1;Other (comment) (Tub bench)  Recommendations for Other Services       Functional Status Assessment  Patient has had a recent decline in their functional status and demonstrates the ability to make significant improvements in function in a reasonable and predictable amount of time.     Precautions / Restrictions Precautions Recall of Precautions/Restrictions: Intact Restrictions Weight Bearing Restrictions Per Provider Order: No      Mobility  Bed Mobility Overal bed mobility: Independent                  Transfers Overall transfer level: Modified independent                 General transfer comment: Guarded but overall mod I without unsteadiness or LOB.    Ambulation/Gait Ambulation/Gait assistance: Modified independent (Device/Increase time) Gait Distance (Feet): 500 Feet Assistive device: None Gait Pattern/deviations: Step-through pattern, Decreased stride length Gait velocity: Decreased Gait velocity interpretation: <1.31 ft/sec, indicative of household ambulator   General Gait Details: Decreased gait speed and appears guarded, but without unsteadiness or LOB. Pt reports feeling dizzy with head turns, however later states it is not dizziness, it just makes her feel off. Noted pt has a history of vertigo.  Stairs Stairs: Yes Stairs assistance: Modified independent (Device/Increase time) Stair Management: One rail Right, Step to pattern, Forwards Number of Stairs: 2 General stair comments: Practice stairs in rehab gym. No physical difficulty managing steps at all however reports she felt a little dizzy when looking up as she went up the stairs. Consistent with report during vertical head turns.  Wheelchair Mobility     Tilt Bed    Modified Rankin (Stroke Patients Only)       Balance Overall balance assessment: Needs assistance Sitting-balance support: Feet  supported, No upper extremity supported Sitting balance-Leahy Scale: Good     Standing balance support: No upper extremity supported, During functional activity Standing balance-Leahy  Scale: Fair                   Standardized Balance Assessment Standardized Balance Assessment : Dynamic Gait Index   Dynamic Gait Index Level Surface: Normal Change in Gait Speed: Normal Gait with Horizontal Head Turns: Mild Impairment Gait with Vertical Head Turns: Mild Impairment Gait and Pivot Turn: Normal Step Over Obstacle: Mild Impairment Step Around Obstacles: Normal Steps: Mild Impairment Total Score: 20       Pertinent Vitals/Pain Pain Assessment Pain Assessment: No/denies pain    Home Living Family/patient expects to be discharged to:: Private residence Living Arrangements: Spouse/significant other Available Help at Discharge: Family;Available 24 hours/day Type of Home: Apartment Home Access: Stairs to enter Entrance Stairs-Rails: Doctor, General Practice of Steps: 2 flights   Home Layout: One level Home Equipment: None      Prior Function Prior Level of Function : Needs assist             Mobility Comments: Needing assist from c-section for LB dressing and getting in/out of tub. Prior to that independent       Extremity/Trunk Assessment   Upper Extremity Assessment Upper Extremity Assessment: Overall WFL for tasks assessed    Lower Extremity Assessment Lower Extremity Assessment: Overall WFL for tasks assessed    Cervical / Trunk Assessment Cervical / Trunk Assessment: Normal (Pt did not complain of any cervical pain throughout session.)  Communication   Communication Communication: No apparent difficulties    Cognition Arousal: Alert Behavior During Therapy: WFL for tasks assessed/performed                             Following commands: Intact       Cueing Cueing Techniques: Verbal cues, Gestural cues     General Comments      Exercises     Assessment/Plan    PT Assessment Patient needs continued PT services  PT Problem List Decreased activity tolerance;Decreased balance       PT Treatment  Interventions DME instruction;Gait training;Stair training;Functional mobility training;Therapeutic activities;Therapeutic exercise;Balance training;Patient/family education    PT Goals (Current goals can be found in the Care Plan section)  Acute Rehab PT Goals Patient Stated Goal: Return home, be able to manage at home when husband goes back to work PT Goal Formulation: With patient Time For Goal Achievement: 05/13/24 Potential to Achieve Goals: Good Additional Goals Additional Goal #1: Pt will be able to improve DGI score to 24/24 to show decreased risk for falls compared to initial evaluation.    Frequency Min 1X/week     Co-evaluation               AM-PAC PT 6 Clicks Mobility  Outcome Measure Help needed turning from your back to your side while in a flat bed without using bedrails?: None Help needed moving from lying on your back to sitting on the side of a flat bed without using bedrails?: None Help needed moving to and from a bed to a chair (including a wheelchair)?: None Help needed standing up from a chair using your arms (e.g., wheelchair or bedside chair)?: None Help needed to walk in hospital room?: None Help needed climbing 3-5 steps with a railing? : A Little 6 Click Score: 23    End of Session  Activity Tolerance: Patient tolerated treatment well Patient left: in chair;with call bell/phone within reach Nurse Communication: Mobility status PT Visit Diagnosis: Difficulty in walking, not elsewhere classified (R26.2)    Time: 9192-9160 PT Time Calculation (min) (ACUTE ONLY): 32 min   Charges:   PT Evaluation $PT Eval Low Complexity: 1 Low PT Treatments $Gait Training: 8-22 mins PT General Charges $$ ACUTE PT VISIT: 1 Visit         Leita Sable, PT, DPT Acute Rehabilitation Services Secure Chat Preferred Office: [phone]   Leita JONETTA Sable 05/06/2024, 10:09 AM

## 2024-05-06 NOTE — TOC Transition Note (Signed)
 Transition of Care [facility]) - Discharge Note   Patient Details  Name: Carolyn Cole MRN: [id number] Date of Birth: [birth year]  Transition of Care [facility]) CM/SW Contact:  Tom-Johnson, Harvest Muskrat, RN Phone Number: 05/06/2024, 3:49 PM   Clinical Narrative:     Patient is scheduled for discharge today.  Outpatient referral, hospital f/u and discharge instructions on AVS. No ICM needs or recommendations noted. Significant Other, Durenda will transport at discharge.  No further ICM needs noted.      Final next level of care: Home/Self Care Barriers to Discharge: Barriers Resolved   Patient Goals and CMS Choice Patient states their goals for this hospitalization and ongoing recovery are:: To return home CMS Medicare.gov Compare Post Acute Care list provided to:: Patient Choice offered to / list presented to : NA      Discharge Placement                Patient to be transferred to facility by: Significant other Name of family member notified: Durenda    Discharge Plan and Services Additional resources added to the After Visit Summary for                  DME Arranged: N/A DME Agency: NA       HH Arranged: NA HH Agency: NA        Social Drivers of Health (SDOH) Interventions SDOH Screenings   Food Insecurity: No Food Insecurity (05/05/2024)  Housing: Low Risk (05/05/2024)  Transportation Needs: No Transportation Needs (05/05/2024)  Utilities: Not At Risk (05/05/2024)  Depression (PHQ2-9): Low Risk (01/05/2023)  Tobacco Use: Low Risk (04/14/2024)     Readmission Risk Interventions     No data to display

## 2024-05-06 NOTE — Discharge Summary (Signed)
 " Physician Discharge Summary   Patient: Carolyn Cole MRN: [id number] DOB: March 13, 1991  Admit date:     05/05/2024  Discharge date: 05/06/24  Discharge Physician: Lonni SHAUNNA Dalton   PCP: Vernon Velna SAUNDERS, MD     Recommendations at discharge:  Follow up with OB-Gyn or PCP for headache Follow up with Neurology Dr. Margaret for follow up headache Dr. Vernon: Please refer to Ophthalmology for fundoscopic exam     Discharge Diagnoses: Principal Problem:   Myofascial pain related headache Other hospital problems:   Post-partum state   Class 3 obesity   History of gestational hypertension   IIH (idiopathic intracranial hypertension)     Hospital Course: 34 y.o. F with gHTN, recent delivery presented with intermittent severe headache for two weeks.    Headache CT head, MRI brain, MRV head ruled out stroke, tumor, bleeding, thrombosis.  Temporal arteritis ruled out with normal ESR.  Neurology consulted, observed overnight and still with headache.  Headache described as episodic, no pain in between.  Worse with laying flat, not exacerbated with upright position, not consistent with post-spinal headache or dissection.    Severe intensity when it occurs, unilateral, radiating to side.  No clear improvement with acetaminophen , NSAIDs, or sumatriptan.    LP ruled out meningitis.  Opening pressure 18cm, IIH unlikely, but Neurology have recommended Ophthalmology follow up for fundoscopic exam.            The [place]  Controlled Substances Registry was reviewed for this patient prior to discharge.  Consultants: Neurology Procedures performed: LP  Disposition: Home Diet recommendation:  Regular diet  DISCHARGE MEDICATION: Allergies as of 05/06/2024       Reactions   Oxycodone  Hcl Other (See Comments)   Felt out of it        Medication List     STOP taking these medications    ibuprofen  800 MG tablet Commonly known as: ADVIL    SUMAtriptan 25 MG  tablet Commonly known as: IMITREX   SUMAtriptan 50 MG tablet Commonly known as: IMITREX       TAKE these medications    acetaminophen  500 MG tablet Commonly known as: TYLENOL  Take 2 tablets (1,000 mg total) by mouth 3 (three) times daily as needed for mild pain (pain score 1-3), moderate pain (pain score 4-6), fever or headache (or Fever >/= 101).   cyclobenzaprine  5 MG tablet Commonly known as: FLEXERIL  Take 5 mg by mouth 3 (three) times daily as needed for muscle spasms.   Lidocaine  HCl 4 % Crea Commonly known as: Bengay Lidocaine  Apply 1 Application topically daily as needed.   naproxen  sodium 220 MG tablet Commonly known as: ALEVE  Take 1-2 tablets (220-440 mg total) by mouth 2 (two) times daily as needed.   PRENATAL 1 PO Take 1 tablet by mouth daily.   tiZANidine  2 MG tablet Commonly known as: ZANAFLEX  Take 1-2 tablets (2-4 mg total) by mouth every 6 (six) hours as needed for muscle spasms.        Follow-up Information     Gretta Gums, MD. Schedule an appointment as soon as possible for a visit in 1 week(s).   Specialty: Obstetrics Contact information: [address] [place] [address] [phone]                 Discharge Instructions     Ambulatory referral to Neurology   Complete by: As directed    An appointment is requested in approximately: 1 week   Discharge instructions  Complete by: As directed    **IMPORTANT DISCHARGE INSTRUCTIONS**   From Dr. Jonel: You were admitted for headache  Here, we did an MRI brain and MR venogram.  These ruled out tumors, bleeding, or clots as the cause.  We ruled out temporal arteritis, infection and intracranial hypertension.  We suspect this is musculoskeletal.  Try naproxen  220 mg twice daily, alternated with tylenol  if needed Massage the neck, use hot pads, replace pillows  Go see Dr. Gretta to follow up how you are doing  Go see Dr. Margaret in 6-8 weeks at the Neurology  office   Increase activity slowly   Complete by: As directed    No wound care   Complete by: As directed        Discharge Exam: Filed Weights   05/05/24 2232 05/06/24 0443  Weight: 109.5 kg 109.5 kg    General: Pt is alert, awake, not in acute distress Neuro/Psych: Strength symmetric in upper and lower extremities.  Judgment and insight appear normal.   Condition at discharge: good  The results of significant diagnostics from this hospitalization (including imaging, microbiology, ancillary and laboratory) are listed below for reference.   Imaging Studies: DG FL GUIDED LUMBAR PUNCTURE Result Date: 05/06/2024 CLINICAL DATA:  34 year old female 2 weeks post delivery of child. Having persistent headaches. MRI brain shows severe narrowing of bilateral transverse sinuses, concerning for IIH. Request for fluoroscopic guided lumbar puncture. EXAM: DIAGNOSTIC LUMBAR PUNCTURE UNDER FLUOROSCOPIC GUIDANCE COMPARISON:  None Available. FLUOROSCOPY: Radiation Exposure Index (as provided by the fluoroscopic device): 10.3 mGy Kerma PROCEDURE: Informed consent was obtained from the patient prior to the procedure, including potential complications of headache, allergy, and pain. With the patient prone, the lower back was prepped with Betadine . 1% Lidocaine  was used for local anesthesia. Lumbar puncture was performed at the L4 level using a 5 inch 22 gauge needle with return of clear CSF. Patient was moved to left lateral decubitus position with an opening pressure of 18 cm water. 12 ml of CSF were obtained for laboratory studies. Closing pressure of measured of 12 cm water. The needle stylet was reinserted and the needle was removed. The patient tolerated the procedure well and there were no apparent complications. IMPRESSION: Technically successful fluoroscopic guided lumbar puncture. Performed by: Wyatt Pommier, PA-C Electronically Signed   By: CHRISTELLA.  Shick M.D.   On: 05/06/2024 13:45   MR Cervical Spine Wo  Contrast Result Date: 05/06/2024 EXAM: MRI CERVICAL SPINE WITHOUT CONTRAST 05/06/2024 09:33:21 AM TECHNIQUE: Multiplanar multisequence MRI of the cervical spine was performed. COMPARISON: Brain MRI 05/05/2024. CLINICAL HISTORY: 34 year old female. Cervical radiculopathy Brain MRI and intracranial MRV yesterday with features suggestive of idiopathic intracranial hypertension. FINDINGS: BONES AND ALIGNMENT: Straightening of cervical lordosis. No significant scoliosis or spondylolisthesis. Normal vertebral body heights. Background marrow signal is normal. No marrow edema. SPINAL CORD: Normal spinal cord size. No abnormal spinal cord signal. Negative cervicomedullary junction and visible posterior fossa. SOFT TISSUES: No paraspinal mass. Major vascular flow voids in the bilateral necks are preserved. Negative visible neck soft tissues. Negative visible lung apices. DEGENERATIVE: C2-C3: Negative. C3-C4: Negative. C4-C5: Negative. C5-C6: Subtle posterior broad based disc bulging and possible annular fissure of the disc (series 6 image 26 and series 2 image 8). No stenosis. C6-C7: Negative. C7-T1: Negative disc. Mild facet hypertrophy. No stenosis. Negative visible upper thoracic spinal canal. IMPRESSION: 1. Essentially normal for age MRI appearance of the cervical spine;minor disc degeneration at C5 C6 with no spinal stenosis or neural impingement. Electronically  signed by: Helayne Hurst MD 05/06/2024 09:51 AM EST RP Workstation: [id number]   MR BRAIN W WO CONTRAST Result Date: 05/05/2024 EXAM: MRI BRAIN WITH AND WITHOUT CONTRAST MR VENOGRAM HEAD WITH AND WITHOUT CONTRAST 05/05/2024 02:47:46 PM TECHNIQUE: Multiplanar multisequence MRI of the brain was performed with and without the administration of intravenous contrast. MR venogram of the head was performed with and without the administration of contrast. CONTRAST: 10 mL Gadavist . COMPARISON: MRI brain 04/01/2021. CLINICAL HISTORY: Headache, increasing frequency or  severity. Headache with increasing frequency or severity. FINDINGS: BRAIN AND VENTRICLES: No acute infarct. No acute intracranial hemorrhage. No mass. No midline shift. No hydrocephalus. Partially empty sella. Normal flow voids. ORBITS: No significant abnormality. SINUSES AND MASTOIDS: No significant abnormality. BONES AND SOFT TISSUES: Normal bone marrow signal. No soft tissue abnormality. VENOGRAM: Severe narrowing of the bilateral transverse sinuses near the transverse-sigmoid junction. Peripheral signal void on axial postcontrast images is favored to reflect in-plane flow artifact due to turbulent flow from severe stenosis. IMPRESSION: 1. Severe narrowing of the bilateral transverse sinuses near the transverse-sigmoid junction, with associated in-plane flow artifact on postcontrast images. Taken together with partially empty sella, these findings are suggestive of idiopathic intracranial hypertension. Electronically signed by: Ryan Chess MD 05/05/2024 03:23 PM EST RP Workstation: [id number]   MR MRV HEAD W WO CONTRAST Result Date: 05/05/2024 EXAM: MRI BRAIN WITH AND WITHOUT CONTRAST MR VENOGRAM HEAD WITH AND WITHOUT CONTRAST 05/05/2024 02:47:46 PM TECHNIQUE: Multiplanar multisequence MRI of the brain was performed with and without the administration of intravenous contrast. MR venogram of the head was performed with and without the administration of contrast. CONTRAST: 10 mL Gadavist . COMPARISON: MRI brain 04/01/2021. CLINICAL HISTORY: Headache, increasing frequency or severity. Headache with increasing frequency or severity. FINDINGS: BRAIN AND VENTRICLES: No acute infarct. No acute intracranial hemorrhage. No mass. No midline shift. No hydrocephalus. Partially empty sella. Normal flow voids. ORBITS: No significant abnormality. SINUSES AND MASTOIDS: No significant abnormality. BONES AND SOFT TISSUES: Normal bone marrow signal. No soft tissue abnormality. VENOGRAM: Severe narrowing of the bilateral  transverse sinuses near the transverse-sigmoid junction. Peripheral signal void on axial postcontrast images is favored to reflect in-plane flow artifact due to turbulent flow from severe stenosis. IMPRESSION: 1. Severe narrowing of the bilateral transverse sinuses near the transverse-sigmoid junction, with associated in-plane flow artifact on postcontrast images. Taken together with partially empty sella, these findings are suggestive of idiopathic intracranial hypertension. Electronically signed by: Ryan Chess MD 05/05/2024 03:23 PM EST RP Workstation: [id number]    Microbiology: Results for orders placed or performed during the hospital encounter of 01/29/21  OB RESULT CONSOLE Group B Strep     Status: None   Collection Time: 12/23/20 12:00 AM  Result Value Ref Range Status   GBS Negative  Final    Labs: CBC: Recent Labs  Lab 05/05/24 0954 05/06/24 0754  WBC 3.7* 8.8  NEUTROABS 2.0  --   HGB 13.3 13.0  HCT 39.8 39.0  MCV 94.3 94.2  PLT 270 293   Basic Metabolic Panel: Recent Labs  Lab 05/05/24 0954 05/06/24 0754  NA 140 138  K 4.6 4.2  CL 104 104  CO2 27 22  GLUCOSE 86 126*  BUN 10 13  CREATININE 0.83 0.69  CALCIUM 10.2 9.9   Liver Function Tests: Recent Labs  Lab 05/05/24 0954 05/06/24 0754  AST 30 24  ALT 34 29  ALKPHOS 78 67  BILITOT 0.4 0.4  PROT 7.0 6.8  ALBUMIN 4.7 4.5   CBG: No  results for input(s): GLUCAP in the last 168 hours.  Discharge time spent: approximately 45 minutes spent on discharge counseling, evaluation of patient on day of discharge, and coordination of discharge planning with nursing, social work, pharmacy and case management  Signed: Lonni SHAUNNA Dalton, MD [facility] 05/06/2024         "

## 2024-05-06 NOTE — Procedures (Signed)
 PROCEDURE SUMMARY:  Successful fluoroscopic guided lumbar puncture.  Opening pressure was 18 cm H2O ~12 mL clear colorless CSF fluid collected and sent for labs. Closing pressure was 12 cm H2O  No immediate complications.  Pt tolerated well.   EBL = trace  Please see full dictation in imaging section of Epic for procedure details.  Electronically signed by: Chez Bulnes, PA-C

## 2024-05-06 NOTE — Plan of Care (Signed)

## 2024-05-06 NOTE — Progress Notes (Addendum)
 CCMD reported at 0730 this morning,  patient heart rate dropped to 38 non sustaining and had a 2.04 sec.pause.

## 2024-05-06 NOTE — Plan of Care (Signed)

## 2024-05-06 NOTE — Progress Notes (Addendum)
 NEUROLOGY CONSULT FOLLOW UP NOTE   Date of service: May 06, 2024 Patient Name: Carolyn Cole MRN:  [id number] DOB:  03-16-1991  Interval Hx/subjective   Plan for LP today given continued headache. Some improvement after steroids and muscle relaxer.  Headache is off-and-on and nonpositional dependent-not very classic for IIH.  Vitals   Vitals:   05/05/24 2333 05/06/24 0404 05/06/24 0443 05/06/24 0850  BP: 131/84 123/77  138/81  Pulse: 73 75  78  Resp: 16 16    Temp: 98.2 F (36.8 C) 98.4 F (36.9 C)  98.4 F (36.9 C)  TempSrc: Oral Oral  Oral  SpO2: 98% 97%  100%  Weight:   109.5 kg   Height:         Body mass index is 41.42 kg/m.  Physical Exam   Constitutional: Appears well-developed and well-nourished.  Psych: Affect appropriate to situation.  Eyes: No scleral injection.  HENT: No OP obstrucion.  Head: Normocephalic.  Cardiovascular: Normal rate and regular rhythm.  Respiratory: Effort normal, non-labored breathing.  GI: Soft.  No distension. There is no tenderness.  Skin: WDI.   Neurologic Examination    Neuro: Mental Status: Patient is awake, alert, oriented to person, place, month, year, and situation. Patient is able to give a clear and coherent history. No signs of aphasia or neglect Cranial Nerves: II: Visual Fields are full. Pupils are equal, round, and reactive to light.   III,IV, VI: EOMI without ptosis or diploplia.  V: Facial sensation is symmetric to temperature VII: Facial movement is symmetric resting and smiling VIII: Hearing is intact to voice X: Palate elevates symmetrically XI: Shoulder shrug is symmetric. XII: Tongue protrudes midline without atrophy or fasciculations.  Motor: Tone is normal. Bulk is normal. 5/5 strength was present in all four extremities.  Sensory: Sensation is symmetric to light touch and temperature in the arms and legs. No extinction to DSS present.  Cerebellar: FNF and HKS are intact  bilaterally   Medications Current Medications[1]  Labs and Diagnostic Imaging   CBC:  Recent Labs  Lab 05/05/24 0954 05/06/24 0754  WBC 3.7* 8.8  NEUTROABS 2.0  --   HGB 13.3 13.0  HCT 39.8 39.0  MCV 94.3 94.2  PLT 270 293    Basic Metabolic Panel:  Lab Results  Component Value Date   NA 138 05/06/2024   K 4.2 05/06/2024   CO2 22 05/06/2024   GLUCOSE 126 (H) 05/06/2024   BUN 13 05/06/2024   CREATININE 0.69 05/06/2024   CALCIUM 9.9 05/06/2024   GFRNONAA >60 05/06/2024   HgbA1c:  Lab Results  Component Value Date   HGBA1C 5.4 05/05/2024   MRI Brain(Personally reviewed): Severe narrowing of the bilateral transverse sinuses near the transverse-sigmoid junction, with associated in-plane flow artifact on postcontrast images. Taken together with partially empty sella, these findings are suggestive of idiopathic intracranial hypertension.    Assessment   Carolyn Cole is a 34 y.o. female anxiety, post partum pre eclampsia, concussion, fibroids who is s/p c section on 1/2. Her current presentation is different than her previous presentation in 2021-2022. She does report some improvement in the pain in her neck and trapezius but does still have a headache. Previously she has had different kind of episodes of dizziness but this time around the headache comes in short bursts and has photophobia and phonophobia.  Her neurologist in the outpatient setting had also recommended MRI followed by LP to be done with some concern for IIH and her current imaging  also raises some concern although clinical history does not completely bode with IIH-given the imaging findings, body habitus and prior episodes of dizziness and current episode of headache and pressure behind the ears, I think an LP should be performed for more clarity.   Recommendations  - Plan for LP today -Further recs to follow. ______________________________________________________________________   Bonney Jorene Last,  NP [facility]   Attending addendum Patient seen and examined Imaging personally reviewed Changes to the note made above as needed  Lumbar puncture performed under fluoroscopy guidance.  Opening pressure 18 cmH2O-which was normal. Symptoms likely related to myofascial cervical pain. Symptomatic treatment with alternating heat and cold packs as well as NSAIDs/acetaminophen . She should be okay for discharge pending preliminary CSF results to ensure that there is no evidence of infection. Follow-up with outpatient neurology in 8 to 12 weeks. Plan relayed to Dr. Jonel     [1]  Current Facility-Administered Medications:    acetaminophen  (TYLENOL ) tablet 650 mg, 650 mg, Oral, Q6H PRN **OR** acetaminophen  (TYLENOL ) suppository 650 mg, 650 mg, Rectal, Q6H PRN, Tobie Mario GAILS, MD   lactated ringers  infusion, , Intravenous, Continuous, Tobie Mario GAILS, MD, Last Rate: 40 mL/hr at 05/05/24 2200, New Bag at 05/05/24 2200   Muscle Rub CREA 1 Application, 1 Application, Topical, PRN, Khaliqdina, Salman, MD   pantoprazole  (PROTONIX ) injection 40 mg, 40 mg, Intravenous, Q12H, Tobie Mario V, MD, 40 mg at 05/06/24 1007   sodium chloride  flush (NS) 0.9 % injection 3 mL, 3 mL, Intravenous, Q12H, Tobie Mario V, MD, 3 mL at 05/06/24 1007   tiZANidine  (ZANAFLEX ) tablet 2-4 mg, 2-4 mg, Oral, Q6H PRN, Khaliqdina, Salman, MD

## 2024-08-18 ENCOUNTER — Institutional Professional Consult (permissible substitution): Admitting: Diagnostic Neuroimaging
